# Patient Record
Sex: Female | Born: 1940 | Race: Black or African American | Hispanic: No | Marital: Single | Smoking: Never smoker
Health system: Southern US, Community
[De-identification: ages and names within clinical notes are randomized; demographics above are authoritative.]

## PROBLEM LIST (undated history)

## (undated) DIAGNOSIS — I509 Heart failure, unspecified: Secondary | ICD-10-CM

## (undated) DIAGNOSIS — G4700 Insomnia, unspecified: Secondary | ICD-10-CM

## (undated) DIAGNOSIS — J9690 Respiratory failure, unspecified, unspecified whether with hypoxia or hypercapnia: Secondary | ICD-10-CM

## (undated) DIAGNOSIS — R4701 Aphasia: Secondary | ICD-10-CM

## (undated) DIAGNOSIS — E11319 Type 2 diabetes mellitus with unspecified diabetic retinopathy without macular edema: Secondary | ICD-10-CM

## (undated) DIAGNOSIS — I10 Essential (primary) hypertension: Secondary | ICD-10-CM

## (undated) DIAGNOSIS — F419 Anxiety disorder, unspecified: Secondary | ICD-10-CM

## (undated) DIAGNOSIS — B009 Herpesviral infection, unspecified: Secondary | ICD-10-CM

## (undated) DIAGNOSIS — E119 Type 2 diabetes mellitus without complications: Secondary | ICD-10-CM

## (undated) DIAGNOSIS — N289 Disorder of kidney and ureter, unspecified: Secondary | ICD-10-CM

## (undated) DIAGNOSIS — F329 Major depressive disorder, single episode, unspecified: Secondary | ICD-10-CM

## (undated) DIAGNOSIS — E785 Hyperlipidemia, unspecified: Secondary | ICD-10-CM

## (undated) DIAGNOSIS — J189 Pneumonia, unspecified organism: Secondary | ICD-10-CM

## (undated) DIAGNOSIS — M1990 Unspecified osteoarthritis, unspecified site: Secondary | ICD-10-CM

## (undated) HISTORY — DX: Aphasia: R47.01

## (undated) HISTORY — DX: Herpesviral infection, unspecified: B00.9

## (undated) HISTORY — DX: Anxiety disorder, unspecified: F41.9

## (undated) HISTORY — DX: Insomnia, unspecified: G47.00

## (undated) HISTORY — PX: EYE SURGERY: SHX253

## (undated) HISTORY — DX: Disorder of kidney and ureter, unspecified: N28.9

## (undated) HISTORY — DX: Major depressive disorder, single episode, unspecified: F32.9

## (undated) HISTORY — PX: CATARACT EXTRACTION: SUR2

## (undated) HISTORY — DX: Type 2 diabetes mellitus with unspecified diabetic retinopathy without macular edema: E11.319

## (undated) HISTORY — DX: Hyperlipidemia, unspecified: E78.5

## (undated) HISTORY — PX: BELOW KNEE LEG AMPUTATION: SUR23

## (undated) HISTORY — DX: Essential (primary) hypertension: I10

## (undated) HISTORY — DX: Pneumonia, unspecified organism: J18.9

## (undated) HISTORY — DX: Respiratory failure, unspecified, unspecified whether with hypoxia or hypercapnia: J96.90

## (undated) HISTORY — DX: Heart failure, unspecified: I50.9

## (undated) HISTORY — DX: Type 2 diabetes mellitus without complications: E11.9

## (undated) HISTORY — DX: Unspecified osteoarthritis, unspecified site: M19.90

---

## 2016-03-04 ENCOUNTER — Inpatient Hospital Stay (HOSPITAL_COMMUNITY)
Admission: AD | Admit: 2016-03-04 | Payer: Self-pay | Source: Other Acute Inpatient Hospital | Admitting: Family Medicine

## 2016-03-04 ENCOUNTER — Inpatient Hospital Stay: Admit: 2016-03-04 | Payer: Self-pay | Source: Other Acute Inpatient Hospital | Admitting: Family Medicine

## 2016-03-06 ENCOUNTER — Inpatient Hospital Stay: Admit: 2016-03-06 | Payer: Self-pay | Admitting: Internal Medicine

## 2016-06-30 ENCOUNTER — Ambulatory Visit (INDEPENDENT_AMBULATORY_CARE_PROVIDER_SITE_OTHER): Payer: Medicare Other | Admitting: Family

## 2016-06-30 ENCOUNTER — Encounter (INDEPENDENT_AMBULATORY_CARE_PROVIDER_SITE_OTHER): Payer: Self-pay

## 2016-06-30 DIAGNOSIS — IMO0002 Reserved for concepts with insufficient information to code with codable children: Secondary | ICD-10-CM | POA: Insufficient documentation

## 2016-06-30 DIAGNOSIS — Z89511 Acquired absence of right leg below knee: Secondary | ICD-10-CM

## 2016-06-30 NOTE — Progress Notes (Signed)
   Office Visit Note   Patient: Julia Thomas           Date of Birth: 01/30/1941           MRN: [id number] Visit Date: 06/30/2016              Requested by: No referring provider defined for this encounter. PCP: No primary care provider on file.   Assessment & Plan: Visit Diagnoses:  1. Below knee amputation status, right (HCC)     Plan: have provided an order for prosthesis set up on the right with [employer]. She will be a good K2 ambulator. Will need physical therapy and gait training. She will follow up with us in 2 months.   Follow-Up Instructions: Return in about 2 months (around 08/31/2016).   Orders:  No orders of the defined types were placed in this encounter.  No orders of the defined types were placed in this encounter.     Procedures: No procedures performed   Clinical Data: No additional findings.   Subjective: Chief Complaint  Patient presents with  . Right Leg - New Evaluation    Prosthetic limb eval right below the knee amputation.    Pt is a 75 year old woman seen for initial evaluation of right below knee amputation for prosthetic set up. She is a resident at [facility]. She is a right BKA, done on 01/2016 with another physician. Residual limb is well healed and has not had a shrinker for any type of post amputation compression.     Review of Systems  Constitutional: Negative for chills and fever.     Objective: Vital Signs: There were no vitals taken for this visit.  Physical Exam  Constitutional: She is oriented to person, place, and time. She appears well-developed and well-nourished.  Pulmonary/Chest: Effort normal.  Musculoskeletal:  Right BKA, residual limb is well healed. No edema, not fully consolidated. No open areas. No callus. No erythema.  Neurological: She is alert and oriented to person, place, and time.  Skin: Skin is warm and dry.  Psychiatric: She has a normal mood and affect.  Nursing note reviewed.   Ortho  Exam  Specialty Comments:  No specialty comments available.  Imaging: No results found.   PMFS History: Patient Active Problem List   Diagnosis Date Noted  . Below knee amputation status, right (HCC) 06/30/2016   No past medical history on file.  No family history on file.  No past surgical history on file. Social History   Occupational History  . Not on file.   Social History Main Topics  . Smoking status: Not on file  . Smokeless tobacco: Not on file  . Alcohol use Not on file  . Drug use: Unknown  . Sexual activity: Not on file

## 2016-07-07 ENCOUNTER — Ambulatory Visit (INDEPENDENT_AMBULATORY_CARE_PROVIDER_SITE_OTHER): Payer: Self-pay | Admitting: Orthopedic Surgery

## 2016-09-01 ENCOUNTER — Ambulatory Visit (INDEPENDENT_AMBULATORY_CARE_PROVIDER_SITE_OTHER): Payer: Medicaid Other | Admitting: Orthopedic Surgery

## 2016-11-15 ENCOUNTER — Telehealth (INDEPENDENT_AMBULATORY_CARE_PROVIDER_SITE_OTHER): Payer: Self-pay | Admitting: Orthopedic Surgery

## 2016-11-15 NOTE — Telephone Encounter (Signed)
I called and advised that they should bring the prosthetic to the appt so that Dr. Lajoyce Corners can check. Also the pt's daughter is concerned about her physical therapy being completed and if there was a way to have them continue to work with her on strengthening . Pt has appt and advised that I will document and this can be discussed at the visit.

## 2016-11-15 NOTE — Telephone Encounter (Signed)
Patients daughter called expressing her concern for her mothers amputated leg. The patient does have a prosthetic leg but she can not walk using it. Wanted to know if she should bring it to her appointment? [phone] # [phone]

## 2016-11-19 ENCOUNTER — Ambulatory Visit (INDEPENDENT_AMBULATORY_CARE_PROVIDER_SITE_OTHER): Payer: Medicare Other | Admitting: Orthopedic Surgery

## 2016-11-25 ENCOUNTER — Ambulatory Visit (INDEPENDENT_AMBULATORY_CARE_PROVIDER_SITE_OTHER): Payer: Medicare Other | Admitting: Orthopedic Surgery

## 2016-11-25 DIAGNOSIS — IMO0002 Reserved for concepts with insufficient information to code with codable children: Secondary | ICD-10-CM

## 2016-11-25 DIAGNOSIS — Z89511 Acquired absence of right leg below knee: Secondary | ICD-10-CM | POA: Diagnosis not present

## 2016-11-25 DIAGNOSIS — R2681 Unsteadiness on feet: Secondary | ICD-10-CM

## 2016-11-25 NOTE — Progress Notes (Signed)
   Office Visit Note   Patient: Julia SalisburyDorothy Thomas           Date of Birth: 03/12/1941           MRN: [id number] Visit Date: 11/25/2016              Requested by: No referring provider defined for this encounter. PCP: Patient, No Pcp Per  Chief Complaint  Patient presents with  . Right Leg - Follow-up    Right BKA      HPI: Patient is a 76 year old woman seen in follow up for right below the knee amputation. Has been residing at [facility]. Family that accompanies states they have paused gait training as patient does not have the balance required to proceed. Has been still working with PT once weekly for balance and strengthening of lower extremities. No concerns voiced.   Assessment & Plan: Visit Diagnoses:  1. Below knee amputation status, right (HCC)   2. Gait instability     Plan: Continue with PT. Follow up in office as needed.   Follow-Up Instructions: Return if symptoms worsen or fail to improve.   Ortho Exam  Patient is alert, oriented, no adenopathy, well-dressed, normal affect, normal respiratory effort. Right BKA is well healed and consolidated. No open areas, impending break down. No callus formation.   Imaging: No results found.  Labs: No results found for: HGBA1C, ESRSEDRATE, CRP, LABURIC, REPTSTATUS, GRAMSTAIN, CULT, LABORGA  Orders:  No orders of the defined types were placed in this encounter.  No orders of the defined types were placed in this encounter.    Procedures: No procedures performed  Clinical Data: No additional findings.  ROS:  All other systems negative, except as noted in the HPI. Review of Systems  Constitutional: Negative for chills and fever.    Objective: Vital Signs: There were no vitals taken for this visit.  Specialty Comments:  No specialty comments available.  PMFS History: Patient Active Problem List   Diagnosis Date Noted  . Below knee amputation status, right (HCC) 06/30/2016   No past medical  history on file.  No family history on file.  No past surgical history on file. Social History   Occupational History  . Not on file.   Social History Main Topics  . Smoking status: Not on file  . Smokeless tobacco: Not on file  . Alcohol use Not on file  . Drug use: Unknown  . Sexual activity: Not on file

## 2018-02-13 ENCOUNTER — Encounter (INDEPENDENT_AMBULATORY_CARE_PROVIDER_SITE_OTHER): Payer: Medicare Other | Admitting: Ophthalmology

## 2018-02-16 NOTE — Progress Notes (Signed)
[facility] Note  02/17/2018     CHIEF COMPLAINT Patient presents for Retina Evaluation and Diabetic Eye Exam   HISTORY OF PRESENT ILLNESS: Julia Thomas is a 77 y.o. female who presents to the clinic today for:   HPI    Retina Evaluation    In both eyes.  This started 1 week ago.  Associated Symptoms Negative for Flashes, Blind Spot, Photophobia, Scalp Tenderness, Fever, Floaters, Pain, Glare, Jaw Claudication, Weight Loss, Distortion, Redness, Trauma, Shoulder/Hip pain and Fatigue.  Context:  distance vision and near vision.  Treatments tried include surgery.  Response to treatment was significant improvement.  I, the attending physician,  performed the HPI with the patient and updated documentation appropriately.          Diabetic Eye Exam    Vision is stable.  Associated Symptoms Negative for Distortion, Redness, Trauma, Shoulder/Hip pain, Fatigue, Weight Loss, Jaw Claudication, Glare, Pain, Floaters, Flashes, Blind Spot, Photophobia, Scalp Tenderness and Fever.  Diabetes characteristics include Type 2 and on insulin.  This started 7 years ago.  Blood sugar level is controlled.  Last Blood Glucose 119.  Associated Diagnosis Kidney Disease.  I, the attending physician,  performed the HPI with the patient and updated documentation appropriately.          Comments    Referral of Dr. Manuella Ghazi retina evaluation. Patient is accompanied  By daughter Julia Thomas. Patient states denies visual issues. Pt is DM2 x 7 years, Bs 119 (02/16/18),A1c unknown,Pt is on insulin , bs are usually WINL per patient, Pt had cataract sx OD 11/2017 and in [place] appx 20yr ago OS with significant improvement. Pt reports she had BKA OD 2017.Pt is using Pred Forte OU.        Last edited by ZBernarda Caffey MD on 02/17/2018  3:10 PM. (History)    Pt states Dr. GSusa Simmondsreferred her to Dr. SManuella Ghazi Pt states she was told that she needed injections OU; Pt states she does not want injections,  reports she has had injections in the past and "they did not help me"; Pt states she has had laser in the past in [place]; Pt reports she has a follow up with Dr. SManuella Ghaziin September;   Referring physician: SFeliz Beam [place] [id number] HISTORICAL INFORMATION:   Selected notes from the MEDICAL RECORD NUMBER Referred by Dr. RDwana Melenafor FA LEE: 06.21.19 (Matilde Bash [BCVA: OD: 20/50+2 OS: 20/60+2] Ocular Hx-NPDR with DME OU, pseudo OU, mac scar OS, glaucoma suspect, lamellar mac hole OS PMH-DM (taking Lantus), HTN    CURRENT MEDICATIONS: Current Outpatient Medications (Ophthalmic Drugs)  Medication Sig  . ketorolac (ACULAR) 0.5 % ophthalmic solution   . prednisoLONE acetate (PRED FORTE) 1 % ophthalmic suspension    No current facility-administered medications for this visit.  (Ophthalmic Drugs)   Current Outpatient Medications (Other)  Medication Sig  . acetaminophen (TYLENOL) 325 MG tablet 650 mg 3 times daily.  .[name] Kitchenaspirin EC 81 MG tablet 81 mg every morning.  .[name] Kitchenatorvastatin (LIPITOR) 10 MG tablet 10 mg nightly.  . fluticasone (FLONASE) 50 MCG/ACT nasal spray 1 spray every morning.  . Insulin Glargine (LANTUS SOLOSTAR) 100 UNIT/ML Solostar Pen 10 Units every morning.  . iron polysaccharides (NIFEREX) 150 MG capsule 150 mg every morning.  . metoprolol tartrate (LOPRESSOR) 25 MG tablet 25 mg 2 times daily.  . polyethylene glycol (MIRALAX / GLYCOLAX) packet 17  g.  . Vitamin D, Ergocalciferol, (DRISDOL) 50000 units CAPS capsule 50,000 Units every 30 days.   No current facility-administered medications for this visit.  (Other)      REVIEW OF SYSTEMS: ROS    Positive for: Endocrine   Negative for: Constitutional, Gastrointestinal, Neurological, Skin, Genitourinary, Musculoskeletal, HENT, Cardiovascular, Eyes, Respiratory, Psychiatric, Allergic/Imm, Heme/Lymph   Last edited by Zenovia Jordan, LPN on 08/27/9369  6:96 PM. (History)        ALLERGIES No Known Allergies  PAST MEDICAL HISTORY Past Medical History:  Diagnosis Date  . Anxiety   . Aphasia   . CHF (congestive heart failure) ([place])   . Diabetes ([place])   . Herpes virus disease   . Hyperlipemia   . Hypertension   . Insomnia   . Kidney disease   . Major depression, chronic   . Osteoarthritis   . Pneumonia   . Respiratory failure [facility])    Past Surgical History:  Procedure Laterality Date  . BELOW KNEE LEG AMPUTATION    . CATARACT EXTRACTION    . EYE SURGERY      FAMILY HISTORY History reviewed. No pertinent family history.  SOCIAL HISTORY Social History   Tobacco Use  . Smoking status: Never Smoker  . Smokeless tobacco: Never Used  Substance Use Topics  . Alcohol use: Never    Frequency: Never  . Drug use: Never         OPHTHALMIC EXAM:  Base Eye Exam    Visual Acuity (Snellen - Linear)      Right Left   Dist [place] 20/40 -2 20/70   Dist ph [place] NI NI       Tonometry (Tonopen, 2:04 PM)      Right Left   Pressure 15 15       Pupils      Dark Light Shape React APD   Right 3 2 Round Brisk None   Left 3 2 Round Brisk None       Visual Fields (Counting fingers)      Left Right    Full Full       Extraocular Movement      Right Left    Full, Ortho Full, Ortho       Neuro/Psych    Oriented x3:  Yes   Mood/Affect:  Normal       Dilation    Both eyes:  1.0% Mydriacyl, 2.5% Phenylephrine @ 2:04 PM        Slit Lamp and Fundus Exam    Slit Lamp Exam      Right Left   Lids/Lashes Dermatochalasis - upper lid Dermatochalasis - upper lid   Conjunctiva/Sclera White and quiet Mild Melanosis   Cornea Arcus, Temporal Well healed cataract wounds Arcus, 1+ Punctate epithelial erosions   Anterior Chamber Deep and quiet Deep and quiet   Iris Round and dilated Round and dilated   Lens PC IOL in good position PC IOL in good position   Vitreous Vitreous syneresis, Posterior vitreous detachment Vitreous syneresis, Posterior  vitreous detachment       Fundus Exam      Right Left   Disc peripapillary pigmentation, no NVD sharp, mild pallor   C/D Ratio 0.6 0.6   Macula Blunted foveal reflex, Microaneurysms, scattered cystic changes Blunted foveal reflex, Epiretinal membrane, pigmented laser scars, scattered Microaneurysms   Vessels Vascular attenuation, Tortuous Severe Vascular attenuation, peripherally scerotic    Periphery Attached, scattered IRH Attached; scattered [facility]  Refraction    Manifest Refraction      Sphere Cylinder Axis Dist [place]   Right [place] Sphere  20/40   Left +0.25 +0.75 103 20/70-          IMAGING AND PROCEDURES  Imaging and Procedures for _0 @  OCT, Retina - OU - Both Eyes       Right Eye Quality was good. Central Foveal Thickness: 376. Progression has no prior data. Findings include abnormal foveal contour, no SRF, intraretinal fluid.   Left Eye Quality was good. Central Foveal Thickness: (Unable to obtain). Progression has no prior data. Findings include intraretinal fluid, abnormal foveal contour, no SRF, outer retinal atrophy (Scattered ORA).   Notes *Images captured and stored on drive  Diagnosis / Impression:  +DME OU  Clinical management:  See below  Abbreviations: NFP - Normal foveal profile. CME - cystoid macular edema. PED - pigment epithelial detachment. IRF - intraretinal fluid. SRF - subretinal fluid. EZ - ellipsoid zone. ERM - epiretinal membrane. ORA - outer retinal atrophy. ORT - outer retinal tubulation. SRHM - subretinal hyper-reflective material         Fluorescein Angiography [place] (Transit OD)       Right Eye Progression has no prior data. Early phase findings include microaneurysm, vascular perfusion defect (IRMA). Mid/Late phase findings include microaneurysm, vascular perfusion defect, leakage (IRMA).   Left Eye Progression has no prior data. Early phase findings include microaneurysm, vascular perfusion defect. Mid/Late phase  findings include leakage, microaneurysm, vascular perfusion defect.   Notes *Images captured and stored on [employer], not [place]  Impression: Severe NPDR OU                  ASSESSMENT/PLAN:    ICD-10-CM   1. 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 Retinal edema H35.81 OCT, Retina - OU - Both Eyes  3. Essential hypertension I10   4. Hypertensive retinopathy of both eyes H35.033 Fluorescein Angiography [place] (Transit OD)  5. Pseudophakia of both eyes Z96.1     1,2. Severe Non-proliferative diabetic retinopathy w/ DME, OU - The incidence, risk factors for progression, natural history and treatment options for diabetic retinopathy  were discussed with patient.   - The need for close monitoring of blood glucose, blood pressure, and serum lipids, avoiding cigarette or any type of tobacco, and the need for long term follow up was also discussed with patient. - exam shows scattered - FA shows scattered MA and capillary nonperfusion OU -- no NV - OCT shows diabetic macular edema OU  The natural history, pathology, and characteristics of diabetic macular edema discussed with patient.  A generalized discussion of the major clinical trials concerning treatment of diabetic macular edema (ETDRS, DCT, SCORE, RISE / RIDE, and ongoing DRCR net studies) was completed.  This discussion included mention of the various approaches to treating diabetic macular edema (observation, laser photocoagulation, anti-VEGF injections with lucentis / Avastin / Eylea, steroid injections with Kenalog / Ozurdex, and intraocular surgery with vitrectomy).  The goal hemoglobin A1C of 6-7 was discussed, as well as importance of smoking cessation and hypertension control.  Need for ongoing treatment and monitoring were specifically discussed with reference to chronic nature  of diabetic macular edema. - DME being managed by Dr. Manuella Ghazi -- possible Iluvien due to pt's hesitance with possibility of repeat injections - recommend PRP laser OU for peripheral capillary nonperfusion, OS first -  pt wishes to come back for treatment - F/U within next 2 weeks for PRP OS  3,4. Hypertensive retinopathy OU - discussed importance of tight BP control - monitor  5. Pseudophakia OU  - s/p CE/IOL by expert surgeon Dr. Susa Simmonds   - beautiful surgeries  - monitor   Ophthalmic Meds Ordered this visit:  No orders of the defined types were placed in this encounter.      Return in about 2 weeks (around 03/03/2018) for F/U NPDR OU, DFE, PRP OS.  There are no Patient Instructions on file for this visit.   Explained the diagnoses, plan, and follow up with the patient and they expressed understanding.  Patient expressed understanding of the importance of proper follow up care.   This document serves as a record of services personally performed by Gardiner Sleeper, MD, PhD. It was created on their behalf by Ernest Mallick, OA, an ophthalmic assistant. The creation of this record is the provider's dictation and/or activities during the visit.    Electronically signed by: Ernest Mallick, OA  08.01.2019 11:56 PM   This document serves as a record of services personally performed by Gardiner Sleeper, MD, PhD. It was created on their behalf by Catha Brow, [place], a certified ophthalmic assistant. The creation of this record is the provider's dictation and/or activities during the visit.  Electronically signed by: Catha Brow, COA  08.02.19 11:56 PM     Gardiner Sleeper, M.D., Ph.D. Diseases & Surgery of the Retina and Vitreous [facility]   I have reviewed the above documentation for accuracy and completeness, and I agree with the above. Gardiner Sleeper, M.D., Ph.D. 02/19/18 11:56 PM     Abbreviations: M myopia (nearsighted); A astigmatism; H  hyperopia (farsighted); P presbyopia; Mrx spectacle prescription;  CTL contact lenses; OD right eye; OS left eye; OU both eyes  XT exotropia; ET esotropia; PEK punctate epithelial keratitis; PEE punctate epithelial erosions; DES dry eye syndrome; MGD meibomian gland dysfunction; ATs artificial tears; PFAT's preservative free artificial tears; Alpha nuclear sclerotic cataract; PSC posterior subcapsular cataract; ERM epi-retinal membrane; PVD posterior vitreous detachment; RD retinal detachment; DM diabetes mellitus; DR diabetic retinopathy; NPDR non-proliferative diabetic retinopathy; PDR proliferative diabetic retinopathy; CSME clinically significant macular edema; DME diabetic macular edema; dbh dot blot hemorrhages; CWS cotton wool spot; POAG primary open angle glaucoma; C/D cup-to-disc ratio; HVF humphrey visual field; GVF goldmann visual field; OCT optical coherence tomography; IOP intraocular pressure; BRVO Branch retinal vein occlusion; CRVO central retinal vein occlusion; CRAO central retinal artery occlusion; BRAO branch retinal artery occlusion; RT retinal tear; SB scleral buckle; PPV pars plana vitrectomy; VH Vitreous hemorrhage; PRP panretinal laser photocoagulation; IVK intravitreal kenalog; VMT vitreomacular traction; MH Macular hole;  NVD neovascularization of the disc; NVE neovascularization elsewhere; AREDS age related eye disease study; ARMD age related macular degeneration; POAG primary open angle glaucoma; EBMD epithelial/anterior basement membrane dystrophy; ACIOL anterior chamber intraocular lens; IOL intraocular lens; PCIOL posterior chamber intraocular lens; Phaco/IOL phacoemulsification with intraocular lens placement; Davis photorefractive keratectomy; LASIK laser assisted in situ keratomileusis; HTN hypertension; DM diabetes mellitus; COPD chronic obstructive pulmonary disease

## 2018-02-17 ENCOUNTER — Encounter (INDEPENDENT_AMBULATORY_CARE_PROVIDER_SITE_OTHER): Payer: Self-pay | Admitting: Ophthalmology

## 2018-02-17 ENCOUNTER — Ambulatory Visit (INDEPENDENT_AMBULATORY_CARE_PROVIDER_SITE_OTHER): Payer: Medicare Other | Admitting: Ophthalmology

## 2018-02-17 DIAGNOSIS — H35033 Hypertensive retinopathy, bilateral: Secondary | ICD-10-CM | POA: Diagnosis not present

## 2018-02-17 DIAGNOSIS — Z961 Presence of intraocular lens: Secondary | ICD-10-CM

## 2018-02-17 DIAGNOSIS — I10 Essential (primary) hypertension: Secondary | ICD-10-CM | POA: Diagnosis not present

## 2018-02-17 DIAGNOSIS — H3581 Retinal edema: Secondary | ICD-10-CM

## 2018-02-17 DIAGNOSIS — E113413 Type 2 diabetes mellitus with severe nonproliferative diabetic retinopathy with macular edema, bilateral: Secondary | ICD-10-CM

## 2018-02-19 ENCOUNTER — Encounter (INDEPENDENT_AMBULATORY_CARE_PROVIDER_SITE_OTHER): Payer: Self-pay | Admitting: Ophthalmology

## 2018-03-06 NOTE — Progress Notes (Signed)
[facility] Note  03/07/2018     CHIEF COMPLAINT Patient presents for Retina Follow Up   HISTORY OF PRESENT ILLNESS: Julia Thomas is a 77 y.o. female who presents to the clinic today for:   HPI    Retina Follow Up    Patient presents with  Diabetic Retinopathy.  In both eyes.  Severity is severe.  Duration of 2.5 weeks.  I, the attending physician,  performed the HPI with the patient and updated documentation appropriately.          Comments    Patient states vision the same OU. Last BS was 120 yesterday.       Last edited by Bernarda Caffey, MD on 03/07/2018  4:01 PM. (History)    Pt here for PRP OS  Referring physician: No referring provider defined for this encounter.  HISTORICAL INFORMATION:   Selected notes from the MEDICAL RECORD NUMBER Referred by Dr. Dwana Melena for FA LEE: 06.21.19 Matilde Bash) [BCVA: OD: 20/50+2 OS: 20/60+2] Ocular Hx-NPDR with DME OU, pseudo OU, mac scar OS, glaucoma suspect, lamellar mac hole OS PMH-DM (taking Lantus), HTN    CURRENT MEDICATIONS: Current Outpatient Medications (Ophthalmic Drugs)  Medication Sig  . ketorolac (ACULAR) 0.5 % ophthalmic solution   . prednisoLONE acetate (PRED FORTE) 1 % ophthalmic suspension    No current facility-administered medications for this visit.  (Ophthalmic Drugs)   Current Outpatient Medications (Other)  Medication Sig  . acetaminophen (TYLENOL) 325 MG tablet 650 mg 3 times daily.  [name] Kitchen aspirin EC 81 MG tablet 81 mg every morning.  [name] Kitchen atorvastatin (LIPITOR) 10 MG tablet 10 mg nightly.  . fluticasone (FLONASE) 50 MCG/ACT nasal spray 1 spray every morning.  . Insulin Glargine (LANTUS SOLOSTAR) 100 UNIT/ML Solostar Pen 10 Units every morning.  . iron polysaccharides (NIFEREX) 150 MG capsule 150 mg every morning.  . metoprolol tartrate (LOPRESSOR) 25 MG tablet 25 mg 2 times daily.  . mupirocin cream (BACTROBAN) 2 % Apply 2 application topically 2 (two) times daily.  .  polyethylene glycol (MIRALAX / GLYCOLAX) packet 17 g.  . Vitamin D, Ergocalciferol, (DRISDOL) 50000 units CAPS capsule 50,000 Units every 30 days.   No current facility-administered medications for this visit.  (Other)      REVIEW OF SYSTEMS: ROS    Positive for: Endocrine   Negative for: Constitutional, Gastrointestinal, Neurological, Skin, Genitourinary, Musculoskeletal, HENT, Cardiovascular, Eyes, Respiratory, Psychiatric, Allergic/Imm, Heme/Lymph   Last edited by Roselee Nova D on 03/07/2018  3:04 PM. (History)       ALLERGIES No Known Allergies  PAST MEDICAL HISTORY Past Medical History:  Diagnosis Date  . Anxiety   . Aphasia   . CHF (congestive heart failure) ([place])   . Diabetes ([place])   . Herpes virus disease   . Hyperlipemia   . Hypertension   . Insomnia   . Kidney disease   . Major depression, chronic   . Osteoarthritis   . Pneumonia   . Respiratory failure [facility])    Past Surgical History:  Procedure Laterality Date  . BELOW KNEE LEG AMPUTATION    . CATARACT EXTRACTION    . EYE SURGERY      FAMILY HISTORY History reviewed. No pertinent family history.  SOCIAL HISTORY Social History   Tobacco Use  . Smoking status: Never Smoker  . Smokeless tobacco: Never Used  Substance Use Topics  . Alcohol use: Never    Frequency: Never  . Drug use: Never  OPHTHALMIC EXAM:  Base Eye Exam    Visual Acuity (Snellen - Linear)      Right Left   Dist [place] 20/40 -2 20/70   Dist ph [place] NI 20/60       Tonometry (Tonopen, 3:12 PM)      Right Left   Pressure 14 17       Pupils      Dark Light Shape React APD   Right 3 2 Round Brisk None   Left 3 2 Round Brisk None       Visual Fields (Counting fingers)      Left Right    Full Full       Extraocular Movement      Right Left    Full, Ortho Full, Ortho       Neuro/Psych    Oriented x3:  Yes   Mood/Affect:  Normal       Dilation    Both eyes:  1.0% Mydriacyl, 10% phenylepherine @ 3:12 PM        Dilation #2    Both eyes:  1.0% Mydriacyl, 2.5% Phenylephrine @ 3:29 PM       Dilation Comments   Dilation X2 with both drops        Slit Lamp and Fundus Exam    Slit Lamp Exam      Right Left   Lids/Lashes Dermatochalasis - upper lid Dermatochalasis - upper lid   Conjunctiva/Sclera White and quiet Mild Melanosis   Cornea Arcus, Temporal Well healed cataract wounds Arcus, 1+ Punctate epithelial erosions   Anterior Chamber Deep and quiet Deep and quiet   Iris Round and dilated Round and dilated   Lens PC IOL in good position PC IOL in good position   Vitreous Vitreous syneresis, Posterior vitreous detachment Vitreous syneresis, Posterior vitreous detachment       Fundus Exam      Right Left   Disc peripapillary pigmentation, no NVD sharp, mild pallor   C/D Ratio 0.6 0.6   Macula Blunted foveal reflex, Microaneurysms, scattered cystic changes Blunted foveal reflex, Epiretinal membrane, pigmented laser scars, scattered Microaneurysms   Vessels Vascular attenuation, Tortuous Severe Vascular attenuation, peripherally scerotic    Periphery Attached, scattered IRH Attached; scattered IRH          IMAGING AND PROCEDURES  Imaging and Procedures for @TODAY @  Panretinal Photocoagulation - OS - Left Eye       LASER PROCEDURE NOTE  Diagnosis:   Severe Non-Proliferative Diabetic Retinopathy, LEFT EYE  Procedure:  Pan-retinal photocoagulation using laser indirect ophthalmoscope, LEFT EYE  Anesthesia:  Topical  Surgeon: Bernarda Caffey, MD, PhD   Informed consent obtained, operative eye marked, and time out performed prior to initiation of laser.   Lumenis [id number] Laser Indirect Ophthalmoscope Power: 280 mW Duration: 70 msec   # spots: 774 spots mostly nasal hemisphere  Complications: None.  Notes: pt unable to complete PRP due to pain  RTC: 2-3 wks for PRP completion OS -- will use subconj lido  Patient tolerated the procedure well and received written and  verbal post-procedure care information/education.                 ASSESSMENT/PLAN:    ICD-10-CM   1. 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 Retinal edema H35.81 CANCELED: OCT, Retina -  OU - Both Eyes  3. Essential hypertension I10   4. Hypertensive retinopathy of both eyes H35.033   5. Pseudophakia of both eyes Z96.1     1,2. Severe Non-proliferative diabetic retinopathy w/ DME, OU - The incidence, risk factors for progression, natural history and treatment options for diabetic retinopathy  were discussed with patient.   - The need for close monitoring of blood glucose, blood pressure, and serum lipids, avoiding cigarette or any type of tobacco, and the need for long term follow up was also discussed with patient. - exam shows scattered - FA shows scattered MA and capillary nonperfusion OU -- no NV - OCT shows diabetic macular edema OU  The natural history, pathology, and characteristics of diabetic macular edema discussed with patient.  A generalized discussion of the major clinical trials concerning treatment of diabetic macular edema (ETDRS, DCT, SCORE, RISE / RIDE, and ongoing DRCR net studies) was completed.  This discussion included mention of the various approaches to treating diabetic macular edema (observation, laser photocoagulation, anti-VEGF injections with lucentis / Avastin / Eylea, steroid injections with Kenalog / Ozurdex, and intraocular surgery with vitrectomy).  The goal hemoglobin A1C of 6-7 was discussed, as well as importance of smoking cessation and hypertension control.  Need for ongoing treatment and monitoring were specifically discussed with reference to chronic nature of diabetic macular edema. - DME being managed by Dr. Manuella Ghazi -- possible Iluvien due to pt's hesitance with  possibility of repeat injections - recommend PRP laser OU for peripheral capillary nonperfusion, OS first - RBA of procedure discussed, questions answered - informed consent obtained and signed - see procedure note -- laser stopped due to patient discomfort - RTC 2 wks for completion of PRP OS -- will use subconj lidocaine  3,4. Hypertensive retinopathy OU - discussed importance of tight BP control - monitor  5. Pseudophakia OU  - s/p CE/IOL by expert surgeon Dr. Susa Simmonds   - beautiful surgeries  - monitor   Ophthalmic Meds Ordered this visit:  No orders of the defined types were placed in this encounter.      Return in about 2 weeks (around 03/21/2018) for Laser OS, OCT.  There are no Patient Instructions on file for this visit.   Explained the diagnoses, plan, and follow up with the patient and they expressed understanding.  Patient expressed understanding of the importance of proper follow up care.   This document serves as a record of services personally performed by Gardiner Sleeper, MD, PhD. It was created on their behalf by Ernest Mallick, OA, an ophthalmic assistant. The creation of this record is the provider's dictation and/or activities during the visit.    Electronically signed by: Ernest Mallick, OA  08.20.2019 4:20 PM    Gardiner Sleeper, M.D., Ph.D. Diseases & Surgery of the Retina and Vitreous [facility]   I have reviewed the above documentation for accuracy and completeness, and I agree with the above. Gardiner Sleeper, M.D., Ph.D. 03/07/18 4:20 PM    Abbreviations: M myopia (nearsighted); A astigmatism; H hyperopia (farsighted); P presbyopia; Mrx spectacle prescription;  CTL contact lenses; OD right eye; OS left eye; OU both eyes  XT exotropia; ET esotropia; PEK punctate epithelial keratitis; PEE punctate epithelial erosions; DES dry eye syndrome; MGD meibomian gland dysfunction; ATs artificial tears; PFAT's preservative free artificial  tears; [place] nuclear sclerotic cataract; PSC posterior subcapsular cataract; ERM epi-retinal membrane; PVD posterior vitreous detachment; RD retinal detachment; DM diabetes mellitus; DR diabetic  retinopathy; NPDR non-proliferative diabetic retinopathy; PDR proliferative diabetic retinopathy; CSME clinically significant macular edema; DME diabetic macular edema; dbh dot blot hemorrhages; CWS cotton wool spot; POAG primary open angle glaucoma; C/D cup-to-disc ratio; HVF humphrey visual field; GVF goldmann visual field; OCT optical coherence tomography; IOP intraocular pressure; BRVO Branch retinal vein occlusion; CRVO central retinal vein occlusion; CRAO central retinal artery occlusion; BRAO branch retinal artery occlusion; RT retinal tear; SB scleral buckle; PPV pars plana vitrectomy; VH Vitreous hemorrhage; PRP panretinal laser photocoagulation; IVK intravitreal kenalog; VMT vitreomacular traction; MH Macular hole;  NVD neovascularization of the disc; NVE neovascularization elsewhere; AREDS age related eye disease study; ARMD age related macular degeneration; POAG primary open angle glaucoma; EBMD epithelial/anterior basement membrane dystrophy; ACIOL anterior chamber intraocular lens; IOL intraocular lens; PCIOL posterior chamber intraocular lens; Phaco/IOL phacoemulsification with intraocular lens placement; [place] photorefractive keratectomy; LASIK laser assisted in situ keratomileusis; HTN hypertension; DM diabetes mellitus; COPD chronic obstructive pulmonary disease

## 2018-03-07 ENCOUNTER — Encounter (INDEPENDENT_AMBULATORY_CARE_PROVIDER_SITE_OTHER): Payer: Self-pay | Admitting: Ophthalmology

## 2018-03-07 ENCOUNTER — Ambulatory Visit (INDEPENDENT_AMBULATORY_CARE_PROVIDER_SITE_OTHER): Payer: 59 | Admitting: Ophthalmology

## 2018-03-07 DIAGNOSIS — H35033 Hypertensive retinopathy, bilateral: Secondary | ICD-10-CM

## 2018-03-07 DIAGNOSIS — E113413 Type 2 diabetes mellitus with severe nonproliferative diabetic retinopathy with macular edema, bilateral: Secondary | ICD-10-CM

## 2018-03-07 DIAGNOSIS — Z961 Presence of intraocular lens: Secondary | ICD-10-CM

## 2018-03-07 DIAGNOSIS — H3581 Retinal edema: Secondary | ICD-10-CM

## 2018-03-07 DIAGNOSIS — I10 Essential (primary) hypertension: Secondary | ICD-10-CM

## 2018-03-17 NOTE — Progress Notes (Signed)
[facility] Note  03/21/2018     CHIEF COMPLAINT Patient presents for Retina Follow Up   HISTORY OF PRESENT ILLNESS: Julia Thomas is a 77 y.o. female who presents to the clinic today for:   HPI    Retina Follow Up    Patient presents with  Diabetic Retinopathy.  In both eyes.  Severity is severe.  Duration of 2 weeks.  I, the attending physician,  performed the HPI with the patient and updated documentation appropriately.          Comments    Patient states vision the same as last visit ou. BS was 119 this am. Last A1c unknown. S/P PRP OS.         Last edited by Bernarda Caffey, MD on 03/21/2018  3:24 PM. (History)    Pt here for PRP OS  Referring physician: No referring provider defined for this encounter.  HISTORICAL INFORMATION:   Selected notes from the MEDICAL RECORD NUMBER Referred by Dr. Dwana Melena for FA LEE: 06.21.19 Matilde Bash) [BCVA: OD: 20/50+2 OS: 20/60+2] Ocular Hx-NPDR with DME OU, pseudo OU, mac scar OS, glaucoma suspect, lamellar mac hole OS PMH-DM (taking Lantus), HTN    CURRENT MEDICATIONS: Current Outpatient Medications (Ophthalmic Drugs)  Medication Sig  . ketorolac (ACULAR) 0.5 % ophthalmic solution   . prednisoLONE acetate (PRED FORTE) 1 % ophthalmic suspension    No current facility-administered medications for this visit.  (Ophthalmic Drugs)   Current Outpatient Medications (Other)  Medication Sig  . acetaminophen (TYLENOL) 325 MG tablet 650 mg 3 times daily.  [name] Kitchen aspirin EC 81 MG tablet 81 mg every morning.  [name] Kitchen atorvastatin (LIPITOR) 10 MG tablet 10 mg nightly.  . fluticasone (FLONASE) 50 MCG/ACT nasal spray 1 spray every morning.  . Insulin Glargine (LANTUS SOLOSTAR) 100 UNIT/ML Solostar Pen 10 Units every morning.  . iron polysaccharides (NIFEREX) 150 MG capsule 150 mg every morning.  . metoprolol tartrate (LOPRESSOR) 25 MG tablet 25 mg 2 times daily.  . mupirocin cream (BACTROBAN) 2 % Apply 2 application  topically 2 (two) times daily.  . polyethylene glycol (MIRALAX / GLYCOLAX) packet 17 g.  . Vitamin D, Ergocalciferol, (DRISDOL) 50000 units CAPS capsule 50,000 Units every 30 days.   No current facility-administered medications for this visit.  (Other)      REVIEW OF SYSTEMS: ROS    Positive for: Endocrine   Negative for: Constitutional, Gastrointestinal, Neurological, Skin, Genitourinary, Musculoskeletal, HENT, Cardiovascular, Eyes, Respiratory, Psychiatric, Allergic/Imm, Heme/Lymph   Last edited by Roselee Nova D on 03/21/2018  2:43 PM. (History)       ALLERGIES No Known Allergies  PAST MEDICAL HISTORY Past Medical History:  Diagnosis Date  . Anxiety   . Aphasia   . CHF (congestive heart failure) ([place])   . Diabetes ([place])   . Herpes virus disease   . Hyperlipemia   . Hypertension   . Insomnia   . Kidney disease   . Major depression, chronic   . Osteoarthritis   . Pneumonia   . Respiratory failure [facility])    Past Surgical History:  Procedure Laterality Date  . BELOW KNEE LEG AMPUTATION    . CATARACT EXTRACTION    . EYE SURGERY      FAMILY HISTORY History reviewed. No pertinent family history.  SOCIAL HISTORY Social History   Tobacco Use  . Smoking status: Never Smoker  . Smokeless tobacco: Never Used  Substance Use Topics  . Alcohol use: Never  Frequency: Never  . Drug use: Never         OPHTHALMIC EXAM:  Base Eye Exam    Visual Acuity (Snellen - Linear)      Right Left   Dist [place] 20/50 +1 20/70 -2   Dist ph [place] NI 20/60 -2       Tonometry (Tonopen, 2:58 PM)      Right Left   Pressure 14 19       Pupils      Dark Light Shape React APD   Right 3 2 Round Brisk None   Left 3 2 Round Brisk None       Visual Fields (Counting fingers)      Left Right    Full Full       Extraocular Movement      Right Left    Full, Ortho Full, Ortho       Neuro/Psych    Oriented x3:  Yes   Mood/Affect:  Normal       Dilation    Both eyes:  1.0%  Mydriacyl, 2.5% Phenylephrine @ 2:59 PM        Slit Lamp and Fundus Exam    Slit Lamp Exam      Right Left   Lids/Lashes Dermatochalasis - upper lid Dermatochalasis - upper lid   Conjunctiva/Sclera White and quiet Mild Melanosis   Cornea Arcus, Temporal Well healed cataract wounds Arcus, 1+ Punctate epithelial erosions   Anterior Chamber Deep and quiet Deep and quiet   Iris Round and dilated Round and dilated   Lens PC IOL in good position PC IOL in good position   Vitreous Vitreous syneresis, Posterior vitreous detachment Vitreous syneresis, Posterior vitreous detachment       Fundus Exam      Right Left   Disc peripapillary pigmentation, no NVD sharp, mild pallor   C/D Ratio 0.6 0.6   Macula Blunted foveal reflex, Microaneurysms, scattered cystic changes Blunted foveal reflex, Epiretinal membrane, pigmented laser scars, scattered Microaneurysms   Vessels Vascular attenuation, Tortuous Severe Vascular attenuation, peripherally scerotic    Periphery Attached, scattered IRH Attached; scattered IRH; early PRP laser changes mostly nasal hemisphere          IMAGING AND PROCEDURES  Imaging and Procedures for _0 @  OCT, Retina - OU - Both Eyes       Right Eye Quality was good. Central Foveal Thickness: 407. Progression has worsened. Findings include abnormal foveal contour, no SRF, intraretinal fluid, outer retinal atrophy.   Left Eye Quality was good. Central Foveal Thickness: (Unable to obtain). Progression has been stable. Findings include intraretinal fluid, abnormal foveal contour, no SRF, outer retinal atrophy (Scattered ORA).   Notes *Images captured and stored on drive  Diagnosis / Impression:  +DME OU  Clinical management:  See below  Abbreviations: NFP - Normal foveal profile. CME - cystoid macular edema. PED - pigment epithelial detachment. IRF - intraretinal fluid. SRF - subretinal fluid. EZ - ellipsoid zone. ERM - epiretinal membrane. ORA - outer retinal  atrophy. ORT - outer retinal tubulation. SRHM - subretinal hyper-reflective material         Panretinal Photocoagulation - OS - Left Eye       LASER PROCEDURE NOTE  Diagnosis:   Severe Non-Proliferative Diabetic Retinopathy, LEFT EYE  Procedure:  Pan-retinal photocoagulation using laser indirect ophthalmoscope, LEFT EYE  Anesthesia:  Topical, subconj lidocaine  Surgeon: Bernarda Caffey, MD, PhD  Informed consent obtained, operative eye marked, and time out performed prior  to initiation of laser.   Lumenis [id number] Laser Indirect Ophthalmoscope Power: 320 mW Duration: 30 msec   # spots: 837 spots 360 fill in, mostly temporal hemisphere  Complications: None.  Notes: pt unable to complete PRP due to pain  RTC: 2-3 wks for PRP OD -- will use subconj lido  Patient tolerated the procedure well and received written and verbal post-procedure care information/education.                 ASSESSMENT/PLAN:    ICD-10-CM   1. 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 Retinal edema H35.81 OCT, Retina - OU - Both Eyes  3. Essential hypertension I10   4. Hypertensive retinopathy of both eyes H35.033   5. Pseudophakia of both eyes Z96.1     1,2. Severe Non-proliferative diabetic retinopathy w/ DME, OU - The incidence, risk factors for progression, natural history and treatment options for diabetic retinopathy  were discussed with patient.   - The need for close monitoring of blood glucose, blood pressure, and serum lipids, avoiding cigarette or any type of tobacco, and the need for long term follow up was also discussed with patient. - exam shows scattered [place] - FA shows scattered MA and capillary nonperfusion OU -- no NV - OCT shows persistent diabetic macular edema OU  - DME being managed by Dr. Manuella Ghazi --  possible Iluvien due to pt's hesitance with possibility of repeat injections - recommend PRP laser OU for peripheral capillary nonperfusion, OS first - S/P incomplete PRP OS (08.20.19)  - Recommend fill in PRP OS with sub-conj lido today (09.03.19) - RBA of procedure discussed, questions answered - informed consent obtained and signed - see procedure note -- laser better tolerated with subconj lido - RTC 2 wks for PRP OD  3,4. Hypertensive retinopathy OU - discussed importance of tight BP control - monitor  5. Pseudophakia OU  - s/p CE/IOL by expert surgeon Dr. Susa Simmonds   - beautiful surgeries  - monitor   Ophthalmic Meds Ordered this visit:  No orders of the defined types were placed in this encounter.      Return in about 2 weeks (around 04/04/2018) for F/U NPDR OU, DFE, OCT, PRP OD (subconj lido).  There are no Patient Instructions on file for this visit.   Explained the diagnoses, plan, and follow up with the patient and they expressed understanding.  Patient expressed understanding of the importance of proper follow up care.   This document serves as a record of services personally performed by Gardiner Sleeper, MD, PhD. It was created on their behalf by Ernest Mallick, OA, an ophthalmic assistant. The creation of this record is the provider's dictation and/or activities during the visit.    Electronically signed by: Ernest Mallick, OA  08.30.2019 4:11 PM    Gardiner Sleeper, M.D., Ph.D. Diseases & Surgery of the Retina and Vitreous [facility]   I have reviewed the above documentation for accuracy and completeness, and I agree with the above. Gardiner Sleeper, M.D., Ph.D. 03/21/18 4:11 PM   Abbreviations: M myopia (nearsighted); A astigmatism; H hyperopia (farsighted); P presbyopia; Mrx spectacle prescription;  CTL contact lenses; OD right eye; OS left eye; OU both eyes  XT exotropia; ET esotropia; PEK punctate epithelial keratitis; PEE punctate  epithelial erosions; DES  dry eye syndrome; MGD meibomian gland dysfunction; ATs artificial tears; PFAT's preservative free artificial tears; [place] nuclear sclerotic cataract; PSC posterior subcapsular cataract; ERM epi-retinal membrane; PVD posterior vitreous detachment; RD retinal detachment; DM diabetes mellitus; DR diabetic retinopathy; NPDR non-proliferative diabetic retinopathy; PDR proliferative diabetic retinopathy; CSME clinically significant macular edema; DME diabetic macular edema; dbh dot blot hemorrhages; CWS cotton wool spot; POAG primary open angle glaucoma; C/D cup-to-disc ratio; HVF humphrey visual field; GVF goldmann visual field; OCT optical coherence tomography; IOP intraocular pressure; BRVO Branch retinal vein occlusion; CRVO central retinal vein occlusion; CRAO central retinal artery occlusion; BRAO branch retinal artery occlusion; RT retinal tear; SB scleral buckle; PPV pars plana vitrectomy; VH Vitreous hemorrhage; PRP panretinal laser photocoagulation; IVK intravitreal kenalog; VMT vitreomacular traction; MH Macular hole;  NVD neovascularization of the disc; NVE neovascularization elsewhere; AREDS age related eye disease study; ARMD age related macular degeneration; POAG primary open angle glaucoma; EBMD epithelial/anterior basement membrane dystrophy; ACIOL anterior chamber intraocular lens; IOL intraocular lens; PCIOL posterior chamber intraocular lens; Phaco/IOL phacoemulsification with intraocular lens placement; [place] photorefractive keratectomy; LASIK laser assisted in situ keratomileusis; HTN hypertension; DM diabetes mellitus; COPD chronic obstructive pulmonary disease

## 2018-03-21 ENCOUNTER — Encounter (INDEPENDENT_AMBULATORY_CARE_PROVIDER_SITE_OTHER): Payer: Self-pay | Admitting: Ophthalmology

## 2018-03-21 ENCOUNTER — Ambulatory Visit (INDEPENDENT_AMBULATORY_CARE_PROVIDER_SITE_OTHER): Payer: 59 | Admitting: Ophthalmology

## 2018-03-21 DIAGNOSIS — H3581 Retinal edema: Secondary | ICD-10-CM

## 2018-03-21 DIAGNOSIS — I10 Essential (primary) hypertension: Secondary | ICD-10-CM

## 2018-03-21 DIAGNOSIS — E113413 Type 2 diabetes mellitus with severe nonproliferative diabetic retinopathy with macular edema, bilateral: Secondary | ICD-10-CM

## 2018-03-21 DIAGNOSIS — H35033 Hypertensive retinopathy, bilateral: Secondary | ICD-10-CM

## 2018-03-21 DIAGNOSIS — Z961 Presence of intraocular lens: Secondary | ICD-10-CM

## 2018-03-23 ENCOUNTER — Ambulatory Visit (INDEPENDENT_AMBULATORY_CARE_PROVIDER_SITE_OTHER): Payer: 59 | Admitting: Physician Assistant

## 2018-03-23 ENCOUNTER — Encounter (INDEPENDENT_AMBULATORY_CARE_PROVIDER_SITE_OTHER): Payer: Self-pay | Admitting: Physician Assistant

## 2018-03-23 DIAGNOSIS — Z89511 Acquired absence of right leg below knee: Secondary | ICD-10-CM

## 2018-03-23 NOTE — Progress Notes (Signed)
Office Visit Note   Patient: Julia Thomas           Date of Birth: [birth year]           MRN: [id number] Visit Date: 03/23/2018              Requested by: No referring provider defined for this encounter. PCP: Patient, No Pcp Per  No chief complaint on file.     HPI: Patient is a very pleasant 77 year old female who presents in a wheelchair with her daughter.  She underwent a right transtibial amputation.  [facility].  She does not ambulate much as she has other medical problems as well as difficulty seeing.  She did recently have cataract surgery and they are hopeful that this may help with her vision.  She developed some blistered areas over the posterior knee and distal thigh when she was wearing her prosthesis during the day all the time.  She does not really ambulates but does utilize her prosthesis for transfers.  She was seen by biotech and they felt the silicone liner might be causing some blistering.  Assessment & Plan: Visit Diagnoses: No diagnosis found.  Plan: The patient will begin Drysol to the posterior knee and thigh of the right transtibial amputation areas at bedtimes x3 nights then 2-3 times per week to keep the posterior knee and thigh areas dryer and hopefully keep them from blistering.  The patient and daughter in agreement to try this and will follow-up in 4 weeks if they see no improvement.  Follow-Up Instructions: No follow-ups on file.   Ortho Exam  Patient is alert, oriented, no adenopathy, well-dressed, normal affect, normal respiratory effort. The right transtibial stump is well-healed.  There is no edema.  She has some evidence of healed blisters over the popliteal fossa area of the right posterior knee and about the thigh where the silicone liner fits.  There is no cellulitis.  No edema.  It is nontender.  Imaging: No results found. No images are attached to the encounter.  Labs: No results found for: HGBA1C, ESRSEDRATE,  CRP, LABURIC, REPTSTATUS, GRAMSTAIN, CULT, LABORGA   No results found for: ALBUMIN, PREALBUMIN, LABURIC  There is no height or weight on file to calculate BMI.  Orders:  No orders of the defined types were placed in this encounter.  No orders of the defined types were placed in this encounter.    Procedures: No procedures performed  Clinical Data: No additional findings.  ROS:  All other systems negative, except as noted in the HPI. Review of Systems  Objective: Vital Signs: There were no vitals taken for this visit.  Specialty Comments:  No specialty comments available.  PMFS History: Patient Active Problem List   Diagnosis Date Noted  . Below knee amputation status, right (HCC) 06/30/2016   Past Medical History:  Diagnosis Date  . Anxiety   . Aphasia   . CHF (congestive heart failure) (HCC)   . Diabetes (HCC)   . Herpes virus disease   . Hyperlipemia   . Hypertension   . Insomnia   . Kidney disease   . Major depression, chronic   . Osteoarthritis   . Pneumonia   . Respiratory failure (HCC)     No family history on file.  Past Surgical History:  Procedure Laterality Date  . BELOW KNEE LEG AMPUTATION    . CATARACT EXTRACTION    . EYE SURGERY     Social History  Occupational History  . Occupation: RETIRED NURSE  Tobacco Use  . Smoking status: Never Smoker  . Smokeless tobacco: Never Used  Substance and Sexual Activity  . Alcohol use: Never    Frequency: Never  . Drug use: Never  . Sexual activity: Not on file

## 2018-04-03 NOTE — Progress Notes (Signed)
[facility] Note  04/04/2018     CHIEF COMPLAINT Patient presents for Retina Follow Up and Diabetic Retinopathy with Macular Edema   HISTORY OF PRESENT ILLNESS: Julia Thomas is a 77 y.o. female who presents to the clinic today for:   HPI    Retina Follow Up    Patient presents with  Diabetic Retinopathy.  In both eyes.  This started 1 month ago.  Severity is mild.  Since onset it is stable.  I, the attending physician,  performed the HPI with the patient and updated documentation appropriately.          Comments    F/U PDR OU. Patient states her vision is the same last oV,ocassional floaters os , denies new visual onsets. Bs 114 this am, denies hypo/hyper glycemic episodes. Pf OS completed as instructed.       Last edited by Bernarda Caffey, MD on 04/06/2018 12:12 AM. (History)    Pt here for PRP OD  Referring physician: Feliz Beam, [place], [place] [id number]  HISTORICAL INFORMATION:   Selected notes from the MEDICAL RECORD NUMBER Referred by Dr. Dwana Melena for FA LEE: 06.21.19 Matilde Bash) [BCVA: OD: 20/50+2 OS: 20/60+2] Ocular Hx-NPDR with DME OU, pseudo OU, mac scar OS, glaucoma suspect, lamellar mac hole OS PMH-DM (taking Lantus), HTN    CURRENT MEDICATIONS: Current Outpatient Medications (Ophthalmic Drugs)  Medication Sig  . ketorolac (ACULAR) 0.5 % ophthalmic solution   . prednisoLONE acetate (PRED FORTE) 1 % ophthalmic suspension    No current facility-administered medications for this visit.  (Ophthalmic Drugs)   Current Outpatient Medications (Other)  Medication Sig  . acetaminophen (TYLENOL) 325 MG tablet 650 mg 3 times daily.  [name] Kitchen aspirin EC 81 MG tablet 81 mg every morning.  [name] Kitchen atorvastatin (LIPITOR) 10 MG tablet 10 mg nightly.  . fluticasone (FLONASE) 50 MCG/ACT nasal spray 1 spray every morning.  . Insulin Glargine (LANTUS SOLOSTAR) 100 UNIT/ML Solostar Pen 10 Units every morning.  . iron  polysaccharides (NIFEREX) 150 MG capsule 150 mg every morning.  . metoprolol tartrate (LOPRESSOR) 25 MG tablet 25 mg 2 times daily.  . mupirocin cream (BACTROBAN) 2 % Apply 2 application topically 2 (two) times daily.  . polyethylene glycol (MIRALAX / GLYCOLAX) packet 17 g.  . Vitamin D, Ergocalciferol, (DRISDOL) 50000 units CAPS capsule 50,000 Units every 30 days.   No current facility-administered medications for this visit.  (Other)      REVIEW OF SYSTEMS: ROS    Positive for: Endocrine, Eyes   Negative for: Constitutional, Gastrointestinal, Neurological, Skin, Genitourinary, Musculoskeletal, HENT, Cardiovascular, Respiratory, Psychiatric, Allergic/Imm, Heme/Lymph   Last edited by Roselee Nova D on 04/04/2018  3:20 PM. (History)       ALLERGIES No Known Allergies  PAST MEDICAL HISTORY Past Medical History:  Diagnosis Date  . Anxiety   . Aphasia   . CHF (congestive heart failure) ([place])   . Diabetes ([place])   . Herpes virus disease   . Hyperlipemia   . Hypertension   . Insomnia   . Kidney disease   . Major depression, chronic   . Osteoarthritis   . Pneumonia   . Respiratory failure [facility])    Past Surgical History:  Procedure Laterality Date  . BELOW KNEE LEG AMPUTATION    . CATARACT EXTRACTION    . EYE SURGERY      FAMILY HISTORY History reviewed. No pertinent family history.  SOCIAL HISTORY Social History  Tobacco Use  . Smoking status: Never Smoker  . Smokeless tobacco: Never Used  Substance Use Topics  . Alcohol use: Never    Frequency: Never  . Drug use: Never         OPHTHALMIC EXAM:  Base Eye Exam    Visual Acuity (Snellen - Linear)      Right Left   Dist cc 20/40 -2 20/60 -2   Dist ph cc 20/40 20/60 -1   Correction:  Glasses       Tonometry (Tonopen, 3:37 PM)      Right Left   Pressure 16 17       Pupils      Dark Light Shape React APD   Right 4 3 Round Brisk None   Left 4 3 Round Brisk None       Visual Fields (Counting  fingers)      Left Right    Full Full       Extraocular Movement      Right Left    Full Full       Neuro/Psych    Oriented x3:  Yes   Mood/Affect:  Normal       Dilation    Both eyes:  1.0% Mydriacyl, 2.5% Phenylephrine @ 3:37 PM        Slit Lamp and Fundus Exam    Slit Lamp Exam      Right Left   Lids/Lashes Dermatochalasis - upper lid Dermatochalasis - upper lid   Conjunctiva/Sclera White and quiet Mild Melanosis   Cornea Arcus, Temporal Well healed cataract wounds Arcus, 1+ Punctate epithelial erosions   Anterior Chamber Deep and quiet Deep and quiet   Iris Round and dilated Round and dilated   Lens PC IOL in good position PC IOL in good position   Vitreous Vitreous syneresis, Posterior vitreous detachment Vitreous syneresis, Posterior vitreous detachment       Fundus Exam      Right Left   Disc peripapillary pigmentation, no NVD sharp, mild pallor   C/D Ratio 0.6 0.6   Macula Blunted foveal reflex, Microaneurysms, scattered cystic changes Blunted foveal reflex, Epiretinal membrane, pigmented laser scars, scattered Microaneurysms   Vessels Vascular attenuation, Tortuous Severe Vascular attenuation, peripherally scerotic    Periphery Attached, scattered IRH Attached; scattered IRH; PRP scars          IMAGING AND PROCEDURES  Imaging and Procedures for @TODAY @  OCT, Retina - OU - Both Eyes       Right Eye Quality was good. Central Foveal Thickness: 280. Findings include abnormal foveal contour, no SRF, intraretinal fluid, outer retinal atrophy.   Left Eye Quality was good. Central Foveal Thickness: 334. Findings include intraretinal fluid, abnormal foveal contour, no SRF, outer retinal atrophy (Scattered ORA).   Notes *Images captured and stored on drive  Diagnosis / Impression:  +DME OU  Clinical management:  See below  Abbreviations: NFP - Normal foveal profile. CME - cystoid macular edema. PED - pigment epithelial detachment. IRF - intraretinal  fluid. SRF - subretinal fluid. EZ - ellipsoid zone. ERM - epiretinal membrane. ORA - outer retinal atrophy. ORT - outer retinal tubulation. SRHM - subretinal hyper-reflective material         Panretinal Photocoagulation - OD - Right Eye       LASER PROCEDURE NOTE  Diagnosis:   Severe nonproliferative Diabetic Retinopathy, RIGHT EYE  Procedure:  Pan-retinal photocoagulation using slit lamp laser, RIGHT EYE  Anesthesia:  Topical, Subconjunctival block  Surgeon: Aaron Edelman  Coralyn Pear, MD, PhD   Informed consent obtained, operative eye marked, and time out performed prior to initiation of laser.   Lumenis [id number] Laser Indirect Ophthalmoscope Power: 290 mW Duration: 70 msec   # spots: 865 spots -- unable to complete temporal laser due to pt cooperation / discomfort  Complications: None.  RTC: 3 wks for PRP completion OD  Patient tolerated the procedure well and received written and verbal post-procedure care information/education.                 ASSESSMENT/PLAN:    ICD-10-CM   1. 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 Retinal edema H35.81 OCT, Retina - OU - Both Eyes  3. Essential hypertension I10   4. Hypertensive retinopathy of both eyes H35.033   5. Pseudophakia of both eyes Z96.1     1,2. Severe Non-proliferative diabetic retinopathy w/ DME, OU - pt of Dr. Manuella Ghazi -- referred for FA and PRP laser OU - exam shows scattered [place] - FA shows scattered MA and capillary nonperfusion OU -- no NV - OCT shows persistent diabetic macular edema OU  - DME being managed by Dr. Manuella Ghazi -- possible Iluvien due to pt's hesitance with possibility of repeat injections  Pt scheduled to see Manuella Ghazi on 10.8.19 - S/P incomplete PRP OS (08.20.19), fill-in (09.03.19) - Recommend PRP OD today (09.17.19) - RBA of procedure  discussed, questions answered - informed consent obtained and signed - see procedure note -- laser better tolerated with subconj lido, but unable to complete PRP laser OD today - RTC 2-3 wks for fill in PRP OD  3,4. Hypertensive retinopathy OU - discussed importance of tight BP control - monitor  5. Pseudophakia OU  - s/p CE/IOL by expert surgeon Dr. Susa Simmonds   - beautiful surgeries  - monitor   Ophthalmic Meds Ordered this visit:  No orders of the defined types were placed in this encounter.      Return in about 3 weeks (around 04/25/2018) for Laser PRP fill in OD.  There are no Patient Instructions on file for this visit.   Explained the diagnoses, plan, and follow up with the patient and they expressed understanding.  Patient expressed understanding of the importance of proper follow up care.   This document serves as a record of services personally performed by Gardiner Sleeper, MD, PhD. It was created on their behalf by Catha Brow, [place], a certified ophthalmic assistant. The creation of this record is the provider's dictation and/or activities during the visit.  Electronically signed by: Catha Brow, COA  09.16.19 12:14 AM   Gardiner Sleeper, M.D., Ph.D. Diseases & Surgery of the Retina and Vitreous [facility]  I have reviewed the above documentation for accuracy and completeness, and I agree with the above. Gardiner Sleeper, M.D., Ph.D. 04/06/18 12:17 AM    Abbreviations: M myopia (nearsighted); A astigmatism; H hyperopia (farsighted); P presbyopia; Mrx spectacle prescription;  CTL contact lenses; OD right eye; OS left eye; OU both eyes  XT exotropia; ET esotropia; PEK punctate epithelial keratitis; PEE punctate epithelial erosions; DES dry eye syndrome; MGD meibomian gland dysfunction; ATs artificial tears; PFAT's preservative free artificial tears; [place] nuclear sclerotic cataract; PSC posterior subcapsular cataract; ERM epi-retinal  membrane; PVD posterior vitreous detachment; RD retinal detachment; DM diabetes mellitus; DR diabetic  retinopathy; NPDR non-proliferative diabetic retinopathy; PDR proliferative diabetic retinopathy; CSME clinically significant macular edema; DME diabetic macular edema; dbh dot blot hemorrhages; CWS cotton wool spot; POAG primary open angle glaucoma; C/D cup-to-disc ratio; HVF humphrey visual field; GVF goldmann visual field; OCT optical coherence tomography; IOP intraocular pressure; BRVO Branch retinal vein occlusion; CRVO central retinal vein occlusion; CRAO central retinal artery occlusion; BRAO branch retinal artery occlusion; RT retinal tear; SB scleral buckle; PPV pars plana vitrectomy; VH Vitreous hemorrhage; PRP panretinal laser photocoagulation; IVK intravitreal kenalog; VMT vitreomacular traction; MH Macular hole;  NVD neovascularization of the disc; NVE neovascularization elsewhere; AREDS age related eye disease study; ARMD age related macular degeneration; POAG primary open angle glaucoma; EBMD epithelial/anterior basement membrane dystrophy; ACIOL anterior chamber intraocular lens; IOL intraocular lens; PCIOL posterior chamber intraocular lens; Phaco/IOL phacoemulsification with intraocular lens placement; [place] photorefractive keratectomy; LASIK laser assisted in situ keratomileusis; HTN hypertension; DM diabetes mellitus; COPD chronic obstructive pulmonary disease

## 2018-04-04 ENCOUNTER — Ambulatory Visit (INDEPENDENT_AMBULATORY_CARE_PROVIDER_SITE_OTHER): Payer: 59 | Admitting: Ophthalmology

## 2018-04-04 ENCOUNTER — Encounter (INDEPENDENT_AMBULATORY_CARE_PROVIDER_SITE_OTHER): Payer: Self-pay | Admitting: Ophthalmology

## 2018-04-04 DIAGNOSIS — H3581 Retinal edema: Secondary | ICD-10-CM | POA: Diagnosis not present

## 2018-04-04 DIAGNOSIS — Z961 Presence of intraocular lens: Secondary | ICD-10-CM

## 2018-04-04 DIAGNOSIS — E113413 Type 2 diabetes mellitus with severe nonproliferative diabetic retinopathy with macular edema, bilateral: Secondary | ICD-10-CM

## 2018-04-04 DIAGNOSIS — H35033 Hypertensive retinopathy, bilateral: Secondary | ICD-10-CM

## 2018-04-04 DIAGNOSIS — I10 Essential (primary) hypertension: Secondary | ICD-10-CM

## 2018-04-06 ENCOUNTER — Encounter (INDEPENDENT_AMBULATORY_CARE_PROVIDER_SITE_OTHER): Payer: Self-pay | Admitting: Ophthalmology

## 2018-04-21 NOTE — Progress Notes (Signed)
[facility] Note  05/01/2018     CHIEF COMPLAINT Patient presents for Retina Follow Up   HISTORY OF PRESENT ILLNESS: Julia Thomas is a 77 y.o. female who presents to the clinic today for:   HPI    Retina Follow Up    Patient presents with  Diabetic Retinopathy.  In both eyes.  This started 2 months ago.  Severity is severe.  Duration of 1 month.  Since onset it is stable.  I, the attending physician,  performed the HPI with the patient and updated documentation appropriately.          Comments    Patient states vision the same OU. CBG today was around 115 this am. Last A1c level unknown.        Last edited by Bernarda Caffey, MD on 05/01/2018  4:36 PM. (History)    Pt here for PRP OD  Referring physician: No referring provider defined for this encounter.  HISTORICAL INFORMATION:   Selected notes from the MEDICAL RECORD NUMBER Referred by Dr. Dwana Melena for FA LEE: 06.21.19 Matilde Bash) [BCVA: OD: 20/50+2 OS: 20/60+2] Ocular Hx-NPDR with DME OU, pseudo OU, mac scar OS, glaucoma suspect, lamellar mac hole OS PMH-DM (taking Lantus), HTN    CURRENT MEDICATIONS: Current Outpatient Medications (Ophthalmic Drugs)  Medication Sig  . ketorolac (ACULAR) 0.5 % ophthalmic solution   . prednisoLONE acetate (PRED FORTE) 1 % ophthalmic suspension    No current facility-administered medications for this visit.  (Ophthalmic Drugs)   Current Outpatient Medications (Other)  Medication Sig  . acetaminophen (TYLENOL) 325 MG tablet 650 mg 3 times daily.  [name] Kitchen aspirin EC 81 MG tablet 81 mg every morning.  [name] Kitchen atorvastatin (LIPITOR) 10 MG tablet 10 mg nightly.  . fluticasone (FLONASE) 50 MCG/ACT nasal spray 1 spray every morning.  . Insulin Glargine (LANTUS SOLOSTAR) 100 UNIT/ML Solostar Pen 10 Units every morning.  . iron polysaccharides (NIFEREX) 150 MG capsule 150 mg every morning.  . metoprolol tartrate (LOPRESSOR) 25 MG tablet 25 mg 2 times daily.  .  mupirocin cream (BACTROBAN) 2 % Apply 2 application topically 2 (two) times daily.  . polyethylene glycol (MIRALAX / GLYCOLAX) packet 17 g.  . polyethylene glycol (MIRALAX / GLYCOLAX) packet Take 17 g by mouth as directed.  . Vitamin D, Ergocalciferol, (DRISDOL) 50000 units CAPS capsule 50,000 Units every 30 days.   No current facility-administered medications for this visit.  (Other)      REVIEW OF SYSTEMS: ROS    Positive for: Endocrine, Eyes   Negative for: Constitutional, Gastrointestinal, Neurological, Skin, Genitourinary, Musculoskeletal, HENT, Cardiovascular, Respiratory, Psychiatric, Allergic/Imm, Heme/Lymph   Last edited by Roselee Nova D on 05/01/2018  2:41 PM. (History)       ALLERGIES No Known Allergies  PAST MEDICAL HISTORY Past Medical History:  Diagnosis Date  . Anxiety   . Aphasia   . CHF (congestive heart failure) ([place])   . Diabetes ([place])   . Herpes virus disease   . Hyperlipemia   . Hypertension   . Insomnia   . Kidney disease   . Major depression, chronic   . Osteoarthritis   . Pneumonia   . Respiratory failure [facility])    Past Surgical History:  Procedure Laterality Date  . BELOW KNEE LEG AMPUTATION    . CATARACT EXTRACTION    . EYE SURGERY      FAMILY HISTORY History reviewed. No pertinent family history.  SOCIAL HISTORY Social History   Tobacco  Use  . Smoking status: Never Smoker  . Smokeless tobacco: Never Used  Substance Use Topics  . Alcohol use: Never    Frequency: Never  . Drug use: Never         OPHTHALMIC EXAM:  Base Eye Exam    Visual Acuity (Snellen - Linear)      Right Left   Dist [place] 20/50 20/60 -2   Dist ph  NI NI       Tonometry (Tonopen, 2:53 PM)      Right Left   Pressure 14 13       Pupils      Dark Light Shape React APD   Right 4 3 Round Brisk None   Left 4 3 Round Brisk None       Visual Fields (Counting fingers)      Left Right    Full Full       Extraocular Movement      Right Left     Full, Ortho Full, Ortho       Neuro/Psych    Oriented x3:  Yes   Mood/Affect:  Normal       Dilation    Both eyes:  2.5% Phenylephrine, 1.0% Mydriacyl @ 2:53 PM        Slit Lamp and Fundus Exam    Slit Lamp Exam      Right Left   Lids/Lashes Dermatochalasis - upper lid Dermatochalasis - upper lid   Conjunctiva/Sclera White and quiet Mild Melanosis   Cornea Arcus, Temporal Well healed cataract wounds Arcus, 1+ Punctate epithelial erosions   Anterior Chamber Deep and quiet Deep and quiet   Iris Round and dilated Round and dilated   Lens PC IOL in good position PC IOL in good position   Vitreous Vitreous syneresis, Posterior vitreous detachment Vitreous syneresis, Posterior vitreous detachment       Fundus Exam      Right Left   Disc peripapillary pigmentation, no NVD sharp, mild pallor   C/D Ratio 0.6 0.6   Macula Blunted foveal reflex, Microaneurysms, scattered cystic changes Blunted foveal reflex, Epiretinal membrane, pigmented laser scars, scattered Microaneurysms   Vessels Vascular attenuation, Tortuous Severe Vascular attenuation, peripherally scerotic    Periphery Attached, scattered IRH; 360 PRP w/ room for fill in temporally Attached; scattered IRH; good PRP scars 360          IMAGING AND PROCEDURES  Imaging and Procedures for _0 @  OCT, Retina - OU - Both Eyes       Right Eye Quality was good. Central Foveal Thickness: 317. Progression has worsened. Findings include abnormal foveal contour, no SRF, intraretinal fluid, outer retinal atrophy (Interval increase in IRF).   Left Eye Quality was good. Central Foveal Thickness: 320. Findings include intraretinal fluid, abnormal foveal contour, no SRF, outer retinal atrophy (Scattered ORA).   Notes *Images captured and stored on drive  Diagnosis / Impression:  +DME OU OD w/ Interval increase in IRF  Clinical management:  See below  Abbreviations: NFP - Normal foveal profile. CME - cystoid macular edema.  PED - pigment epithelial detachment. IRF - intraretinal fluid. SRF - subretinal fluid. EZ - ellipsoid zone. ERM - epiretinal membrane. ORA - outer retinal atrophy. ORT - outer retinal tubulation. SRHM - subretinal hyper-reflective material         Panretinal Photocoagulation - OD - Right Eye       LASER PROCEDURE NOTE  Diagnosis:   Severe nonproliferative Diabetic Retinopathy, RIGHT EYE  Procedure:  Pan-retinal photocoagulation using laser indirect ophthalmoscope, RIGHT EYE  Anesthesia:  Topical  Surgeon: Bernarda Caffey, MD, PhD   Informed consent obtained, operative eye marked, and time out performed prior to initiation of laser.   L[facility]is [id number] Laser Indirect Ophthalmoscope Power: 280 mW Duration: 70 msec   # spots: 392 spots temporal fill in  Complications: None.  RTC: 3-4 wks  Patient tolerated the procedure well and received written and verbal post-procedure care information/education.                 ASSESSMENT/PLAN:    ICD-10-CM   1. 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 Retinal edema H35.81 OCT, Retina - OU - Both Eyes  3. Essential hypertension I10   4. Hypertensive retinopathy of both eyes H35.033   5. Pseudophakia of both eyes Z96.1     1,2. Severe Non-proliferative diabetic retinopathy w/ DME, OU - pt of Dr. Manuella Ghazi -- referred for FA and PRP laser OU - exam shows scattered [place] - FA shows scattered MA and capillary nonperfusion OU -- no NV - OCT shows persistent diabetic macular edema OU  - DME being managed by Dr. Manuella Ghazi -- possible Iluvien due to pt's hesitance with possibility of repeat injections  Pt saw Manuella Ghazi on 10.8.19 -- discussed fully switching care over to [facility]  Will speak with [employer] rep, Linna Hoff Ebbing re: benefits investigation - S/P incomplete PRP OS  (08.20.19), fill-in (09.03.19) - S/P incomplete PRP OD (09.17.19) - Recommend PRP OD fill in today (10.14.19) - RBA of procedure discussed, questions answered - informed consent obtained and signed - see procedure note - RTC 3-4 wks for repeat exam, OCT, possible PRP fill in vs repeat FA  3,4. Hypertensive retinopathy OU - discussed importance of tight BP control - monitor  5. Pseudophakia OU  - s/p CE/IOL by expert surgeon Dr. Susa Simmonds   - beautiful surgeries  - monitor   Ophthalmic Meds Ordered this visit:  No orders of the defined types were placed in this encounter.      Return for 3-4 wks;, Dilated Exam, OCT, Laser.  There are no Patient Instructions on file for this visit.   Explained the diagnoses, plan, and follow up with the patient and they expressed understanding.  Patient expressed understanding of the importance of proper follow up care.     Gardiner Sleeper, M.D., Ph.D. Diseases & Surgery of the Retina and Vitreous [facility]   I have reviewed the above documentation for accuracy and completeness, and I agree with the above. Gardiner Sleeper, M.D., Ph.D. 05/01/18 5:20 PM    Abbreviations: M myopia (nearsighted); A astigmatism; H hyperopia (farsighted); P presbyopia; Mrx spectacle prescription;  CTL contact lenses; OD right eye; OS left eye; OU both eyes  XT exotropia; ET esotropia; PEK punctate epithelial keratitis; PEE punctate epithelial erosions; DES dry eye syndrome; MGD meibomian gland dysfunction; ATs artificial tears; PFAT's preservative free artificial tears; [place] nuclear sclerotic cataract; PSC posterior subcapsular cataract; ERM epi-retinal membrane; PVD posterior vitreous detachment; RD retinal detachment; DM diabetes mellitus; DR diabetic retinopathy; NPDR non-proliferative diabetic retinopathy; PDR proliferative diabetic retinopathy; CSME clinically significant macular edema; DME diabetic macular edema; dbh dot blot  hemorrhages; CWS cotton wool spot; POAG primary open angle glaucoma; C/D cup-to-disc ratio; HVF humphrey visual field; GVF goldmann visual  field; OCT optical coherence tomography; IOP intraocular pressure; BRVO Branch retinal vein occlusion; CRVO central retinal vein occlusion; CRAO central retinal artery occlusion; BRAO branch retinal artery occlusion; RT retinal tear; SB scleral buckle; PPV pars plana vitrectomy; VH Vitreous hemorrhage; PRP panretinal laser photocoagulation; IVK intravitreal kenalog; VMT vitreomacular traction; MH Macular hole;  NVD neovascularization of the disc; NVE neovascularization elsewhere; AREDS age related eye disease study; ARMD age related macular degeneration; POAG primary open angle glaucoma; EBMD epithelial/anterior basement membrane dystrophy; ACIOL anterior chamber intraocular lens; IOL intraocular lens; PCIOL posterior chamber intraocular lens; Phaco/IOL phacoemulsification with intraocular lens placement; [place] photorefractive keratectomy; LASIK laser assisted in situ keratomileusis; HTN hypertension; DM diabetes mellitus; COPD chronic obstructive pulmonary disease

## 2018-05-01 ENCOUNTER — Encounter (INDEPENDENT_AMBULATORY_CARE_PROVIDER_SITE_OTHER): Payer: Self-pay | Admitting: Ophthalmology

## 2018-05-01 ENCOUNTER — Ambulatory Visit (INDEPENDENT_AMBULATORY_CARE_PROVIDER_SITE_OTHER): Payer: 59 | Admitting: Ophthalmology

## 2018-05-01 DIAGNOSIS — I10 Essential (primary) hypertension: Secondary | ICD-10-CM

## 2018-05-01 DIAGNOSIS — E113413 Type 2 diabetes mellitus with severe nonproliferative diabetic retinopathy with macular edema, bilateral: Secondary | ICD-10-CM | POA: Diagnosis not present

## 2018-05-01 DIAGNOSIS — Z961 Presence of intraocular lens: Secondary | ICD-10-CM

## 2018-05-01 DIAGNOSIS — H3581 Retinal edema: Secondary | ICD-10-CM | POA: Diagnosis not present

## 2018-05-01 DIAGNOSIS — H35033 Hypertensive retinopathy, bilateral: Secondary | ICD-10-CM

## 2018-05-18 NOTE — Progress Notes (Signed)
Pt left before being seen by MD  Karie Chimera, M.D., Ph.D. Diseases & Surgery of the Retina and Vitreous [facility] 05/22/18

## 2018-05-22 ENCOUNTER — Encounter (INDEPENDENT_AMBULATORY_CARE_PROVIDER_SITE_OTHER): Payer: Self-pay | Admitting: Ophthalmology

## 2018-05-22 ENCOUNTER — Ambulatory Visit (INDEPENDENT_AMBULATORY_CARE_PROVIDER_SITE_OTHER): Payer: 59 | Admitting: Ophthalmology

## 2018-05-22 DIAGNOSIS — E113413 Type 2 diabetes mellitus with severe nonproliferative diabetic retinopathy with macular edema, bilateral: Secondary | ICD-10-CM

## 2018-05-22 DIAGNOSIS — I10 Essential (primary) hypertension: Secondary | ICD-10-CM

## 2018-05-22 DIAGNOSIS — Z961 Presence of intraocular lens: Secondary | ICD-10-CM

## 2018-05-22 DIAGNOSIS — H35033 Hypertensive retinopathy, bilateral: Secondary | ICD-10-CM

## 2018-05-22 DIAGNOSIS — H3581 Retinal edema: Secondary | ICD-10-CM

## 2018-05-29 NOTE — Progress Notes (Signed)
[facility] Note  05/30/2018     CHIEF COMPLAINT Patient presents for Retina Follow Up   HISTORY OF PRESENT ILLNESS: Julia Thomas is a 77 y.o. female who presents to the clinic today for:   HPI    Retina Follow Up    Patient presents with  Diabetic Retinopathy.  In both eyes.  This started 3 months ago.  Severity is mild.  Since onset it is stable.  I, the attending physician,  performed the HPI with the patient and updated documentation appropriately.          Comments    F/U NPDR w/DME OU. Patient states her vision is "about the same since last OV, denies flashes, floaters and pain.BS 120, denies hypo/hyperglycemic episodes. Pt is ready for tx today if indicated.       Last edited by Bernarda Caffey, MD on 05/30/2018  8:56 AM. (History)    pt states vision is about the same as last time, pt states she has had previous injections from a Dr in [place], [place]  Referring physician: No referring provider defined for this encounter.  HISTORICAL INFORMATION:   Selected notes from the MEDICAL RECORD NUMBER Referred by Dr. Dwana Melena for FA LEE: 06.21.19 Matilde Bash) [BCVA: OD: 20/50+2 OS: 20/60+2] Ocular Hx-NPDR with DME OU, pseudo OU, mac scar OS, glaucoma suspect, lamellar mac hole OS PMH-DM (taking Lantus), HTN    CURRENT MEDICATIONS: Current Outpatient Medications (Ophthalmic Drugs)  Medication Sig  . ketorolac (ACULAR) 0.5 % ophthalmic solution   . prednisoLONE acetate (PRED FORTE) 1 % ophthalmic suspension    Current Facility-Administered Medications (Ophthalmic Drugs)  Medication Route  . triamcinolone acetonide (TRIESENCE) 40 MG/ML subtenons injection 4 mg Intravitreal   Current Outpatient Medications (Other)  Medication Sig  . acetaminophen (TYLENOL) 325 MG tablet 650 mg 3 times daily.  [name] Kitchen aspirin EC 81 MG tablet 81 mg every morning.  [name] Kitchen atorvastatin (LIPITOR) 10 MG tablet 10 mg nightly.  . cefTRIAXone (ROCEPHIN) 1 g injection   .  fluticasone (FLONASE) 50 MCG/ACT nasal spray 1 spray every morning.  . Insulin Glargine (LANTUS SOLOSTAR) 100 UNIT/ML Solostar Pen 10 Units every morning.  . iron polysaccharides (NIFEREX) 150 MG capsule 150 mg every morning.  . lidocaine (XYLOCAINE) 1 % (with preservative) injection   . metoprolol tartrate (LOPRESSOR) 25 MG tablet 25 mg 2 times daily.  . mupirocin cream (BACTROBAN) 2 % Apply 2 application topically 2 (two) times daily.  . polyethylene glycol (MIRALAX / GLYCOLAX) packet 17 g.  . polyethylene glycol (MIRALAX / GLYCOLAX) packet Take 17 g by mouth as directed.  . Vitamin D, Ergocalciferol, (DRISDOL) 50000 units CAPS capsule 50,000 Units every 30 days.   No current facility-administered medications for this visit.  (Other)      REVIEW OF SYSTEMS: ROS    Positive for: Endocrine, Eyes   Negative for: Constitutional, Gastrointestinal, Neurological, Skin, Genitourinary, Musculoskeletal, HENT, Cardiovascular, Respiratory, Psychiatric, Allergic/Imm, Heme/Lymph   Last edited by Zenovia Jordan, LPN on 47/42/5956  3:87 AM. (History)       ALLERGIES No Known Allergies  PAST MEDICAL HISTORY Past Medical History:  Diagnosis Date  . Anxiety   . Aphasia   . CHF (congestive heart failure) ([place])   . Diabetes ([place])   . Herpes virus disease   . Hyperlipemia   . Hypertension   . Insomnia   . Kidney disease   . Major depression, chronic   . Osteoarthritis   . Pneumonia   .  Respiratory failure [facility])    Past Surgical History:  Procedure Laterality Date  . BELOW KNEE LEG AMPUTATION    . CATARACT EXTRACTION    . EYE SURGERY      FAMILY HISTORY History reviewed. No pertinent family history.  SOCIAL HISTORY Social History   Tobacco Use  . Smoking status: Never Smoker  . Smokeless tobacco: Never Used  Substance Use Topics  . Alcohol use: Never    Frequency: Never  . Drug use: Never         OPHTHALMIC EXAM:  Base Eye Exam    Visual Acuity (Snellen - Linear)       Right Left   Dist cc 20/80 20/80   Dist ph cc NI NI   Correction:  Glasses       Tonometry (Tonopen, 8:38 AM)      Right Left   Pressure 17 15       Pupils      Dark Light Shape React APD   Right 3 2 Round Brisk None   Left 3 2 Round Brisk None       Visual Fields      Left Right    Full Full       Extraocular Movement      Right Left    Full, Ortho Full, Ortho       Neuro/Psych    Oriented x3:  Yes   Mood/Affect:  Normal       Dilation    Both eyes:  1.0% Mydriacyl, 2.5% Phenylephrine @ 8:38 AM        Slit Lamp and Fundus Exam    Slit Lamp Exam      Right Left   Lids/Lashes Dermatochalasis - upper lid Dermatochalasis - upper lid   Conjunctiva/Sclera White and quiet Mild Melanosis   Cornea Arcus, Temporal Well healed cataract wounds Arcus, 1+ Punctate epithelial erosions   Anterior Chamber Deep and quiet Deep and quiet   Iris Round and dilated Round and dilated   Lens PC IOL in good position PC IOL in good position   Vitreous Vitreous syneresis, Posterior vitreous detachment Vitreous syneresis, Posterior vitreous detachment       Fundus Exam      Right Left   Disc 360 peripapillary pigmentation, no NVD 360 PPP   C/D Ratio 0.6 0.6   Macula flat, Blunted foveal reflex, Microaneurysms, scattered cystic changes Flat, Blunted foveal reflex, Epiretinal membrane, cystic changes, pigmented laser scars, scattered Microaneurysms   Vessels Vascular attenuation, Tortuous Severe Vascular attenuation, peripherally scerotic, focal area of fibrosis along IT arcade   Periphery Attached, scattered IRH; good 360 PRP  Attached; scattered IRH; good PRP scars 360          IMAGING AND PROCEDURES  Imaging and Procedures for _0 @  Intravitreal Injection, Pharmacologic Agent - OS - Left Eye       Time Out 05/30/2018. 9:38 AM. Confirmed correct patient, procedure, site, and patient consented.   Anesthesia Topical anesthesia was used. Anesthetic medications  included Lidocaine 2%, Proparacaine 0.5%.   Procedure Preparation included 5% betadine to ocular surface, eyelid speculum. A 27 gauge needle was used.   Injection:  4 mg triamcinolone acetonide 40 MG/ML   NDC: 5784-6962-95, Lot: [id number], Expiration date: 09/16/2019   Route: Intravitreal, Site: Left Eye, Waste: 0.9 mL  Post-op Post injection exam found visual acuity of at least counting fingers. The patient tolerated the procedure well. There were no complications. The patient received written and verbal post  procedure care education.   Notes PROCEDURE NOTE  PREOPERATIVE DIAGNOSIS:      Diabetic macular edema, LEFT eye  PROCEDURE:              Injection of Intravitreal Triesence, LEFT Eye        SURGEON:             Bernarda Caffey, MD/PhD  ANESTHESIA:         Topical                                                              PROCEDURE:     The various treatment options for Diabetic Macular Edema were discussed with the patient. The risks, benefits and alternatives of intravitreal Triesence injection were discussed. The eye was marked and a time out was performed identifying the correct eye.                                  After successful induction of topical anesthesia with proparacaine drops, a lid speculum was placed. 4% Lidocaine solution was instilled on the inferotemporal quadrant using applicator sticks and the eye was prepped with Betadine swabs.  Subsequently, 0.1 cc of 40 mg/cc Triescence (4 mg) was injected into the vitreous cavity utilizing a 27-gauge needle. There were no complications of the injection.    An AC tap was performed following injection due to elevated IOP using a 30 gauge needle on a syringe with the plunger removed. The needle was placed at the limbus at 5 oclock and approximately 0.09 cc of aqueous was removed from the anterior chamber. Betadine was applied to the tap area before and after the paracentesis was performed. There were no complications. The patient  tolerated the procedure well. The IOP was rechecked and was found to be 6 mmHg by tonopen.  The central retinal artery was checked and found to be patent.  The triamcinolone was identified in the vitreous cavity.  The patient had no complications and the discharge instructions were reviewed.                  ASSESSMENT/PLAN:    ICD-10-CM   1. Severe nonproliferative diabetic retinopathy of both eyes with macular edema associated with type 2 diabetes mellitus (HCC) O17.5102 Intravitreal Injection, Pharmacologic Agent - OS - Left Eye    triamcinolone acetonide (TRIESENCE) 40 MG/ML subtenons injection 4 mg  2. Retinal edema H35.81 CANCELED: OCT, Retina - OU - Both Eyes  3. Essential hypertension I10   4. Hypertensive retinopathy of both eyes H35.033   5. Pseudophakia of both eyes Z96.1     1,2. Severe Non-proliferative diabetic retinopathy w/ DME, OU - pt of Dr. Manuella Ghazi -- referred for FA and PRP laser OU - exam shows scattered [place] - FA shows scattered MA and capillary nonperfusion OU -- no NV - OCT shows persistent diabetic macular edema OU  - DME was being managed previously by Dr. Manuella Ghazi -- possible Iluvien due to pt's hesitance with possibility of repeat injections - Pt saw Manuella Ghazi on 10.8.19 -- discussed fully switching care over to [facility] - Speaking with [employer] rep, Pilar Plate re: benefits investigation - S/P PRP OS (08.20.19), fill-in (09.03.19) - S/P PRP OD (09.17.19),  fill-in (10.14.19) - recommend intravitreal Triesence OS today, 11.12.19 as a steroid challenge for Iluvien - F/U 6 weeks for DFE/OCT and IOP check  3,4. Hypertensive retinopathy OU - discussed importance of tight BP control - monitor  5. Pseudophakia OU  - s/p CE/IOL by expert surgeon Dr. Susa Simmonds   - beautiful surgeries  - monitor   Ophthalmic Meds Ordered this visit:  Meds ordered this encounter  Medications  . triamcinolone acetonide (TRIESENCE) 40 MG/ML subtenons injection 4 mg       Return  in about 6 weeks (around 07/10/2018) for F/U 07/10/18 NPDR OU, DFE, OCT.  There are no Patient Instructions on file for this visit.   Explained the diagnoses, plan, and follow up with the patient and they expressed understanding.  Patient expressed understanding of the importance of proper follow up care.   This document serves as a record of services personally performed by Gardiner Sleeper, MD, PhD. It was created on their behalf by Ernest Mallick, OA, an ophthalmic assistant. The creation of this record is the provider's dictation and/or activities during the visit.    Electronically signed by: Ernest Mallick, OA  11.11.19 8:29 AM    Gardiner Sleeper, M.D., Ph.D. Diseases & Surgery of the Retina and Vitreous [facility]   I have reviewed the above documentation for accuracy and completeness, and I agree with the above. Gardiner Sleeper, M.D., Ph.D. 05/31/18 8:32 AM    Abbreviations: M myopia (nearsighted); A astigmatism; H hyperopia (farsighted); P presbyopia; Mrx spectacle prescription;  CTL contact lenses; OD right eye; OS left eye; OU both eyes  XT exotropia; ET esotropia; PEK punctate epithelial keratitis; PEE punctate epithelial erosions; DES dry eye syndrome; MGD meibomian gland dysfunction; ATs artificial tears; PFAT's preservative free artificial tears; [place] nuclear sclerotic cataract; PSC posterior subcapsular cataract; ERM epi-retinal membrane; PVD posterior vitreous detachment; RD retinal detachment; DM diabetes mellitus; DR diabetic retinopathy; NPDR non-proliferative diabetic retinopathy; PDR proliferative diabetic retinopathy; CSME clinically significant macular edema; DME diabetic macular edema; dbh dot blot hemorrhages; CWS cotton wool spot; POAG primary open angle glaucoma; C/D cup-to-disc ratio; HVF humphrey visual field; GVF goldmann visual field; OCT optical coherence tomography; IOP intraocular pressure; BRVO Branch retinal vein occlusion; CRVO central  retinal vein occlusion; CRAO central retinal artery occlusion; BRAO branch retinal artery occlusion; RT retinal tear; SB scleral buckle; PPV pars plana vitrectomy; VH Vitreous hemorrhage; PRP panretinal laser photocoagulation; IVK intravitreal kenalog; VMT vitreomacular traction; MH Macular hole;  NVD neovascularization of the disc; NVE neovascularization elsewhere; AREDS age related eye disease study; ARMD age related macular degeneration; POAG primary open angle glaucoma; EBMD epithelial/anterior basement membrane dystrophy; ACIOL anterior chamber intraocular lens; IOL intraocular lens; PCIOL posterior chamber intraocular lens; Phaco/IOL phacoemulsification with intraocular lens placement; [place] photorefractive keratectomy; LASIK laser assisted in situ keratomileusis; HTN hypertension; DM diabetes mellitus; COPD chronic obstructive pulmonary disease

## 2018-05-30 ENCOUNTER — Encounter (INDEPENDENT_AMBULATORY_CARE_PROVIDER_SITE_OTHER): Payer: Self-pay | Admitting: Ophthalmology

## 2018-05-30 ENCOUNTER — Ambulatory Visit (INDEPENDENT_AMBULATORY_CARE_PROVIDER_SITE_OTHER): Payer: 59 | Admitting: Ophthalmology

## 2018-05-30 DIAGNOSIS — H35033 Hypertensive retinopathy, bilateral: Secondary | ICD-10-CM | POA: Diagnosis not present

## 2018-05-30 DIAGNOSIS — E113413 Type 2 diabetes mellitus with severe nonproliferative diabetic retinopathy with macular edema, bilateral: Secondary | ICD-10-CM

## 2018-05-30 DIAGNOSIS — I10 Essential (primary) hypertension: Secondary | ICD-10-CM | POA: Diagnosis not present

## 2018-05-30 DIAGNOSIS — H3581 Retinal edema: Secondary | ICD-10-CM

## 2018-05-30 DIAGNOSIS — Z961 Presence of intraocular lens: Secondary | ICD-10-CM

## 2018-05-30 MED ORDER — TRIAMCINOLONE ACETONIDE 40 MG/ML IO SUSP
4.0000 mg | INTRAOCULAR | Status: AC
  Administered 2018-05-30: 4 mg via INTRAVITREAL

## 2018-05-31 ENCOUNTER — Encounter (INDEPENDENT_AMBULATORY_CARE_PROVIDER_SITE_OTHER): Payer: Self-pay | Admitting: Ophthalmology

## 2018-07-10 NOTE — Progress Notes (Signed)
[facility] Note  07/17/2018     CHIEF COMPLAINT Patient presents for Retina Follow Up   HISTORY OF PRESENT ILLNESS: Julia Thomas is a 77 y.o. female who presents to the clinic today for:   HPI    Retina Follow Up    Patient presents with  Diabetic Retinopathy.  In both eyes.  This started months ago.  Severity is severe.  Duration of 7 weeks.  Since onset it is stable.  I, the attending physician,  performed the HPI with the patient and updated documentation appropriately.          Comments    77 y/o female pt here for 7 wk f/u for severe NPDR w/mac edema OU.  No change in [place] OU.  Denies pain, flashes, floaters.  No gtts.  Glucose this a.m. was 133.  A1C unknown.  States she is unable to complete Iluvien form on her own due to poor vision.       Last edited by Bernarda Caffey, MD on 07/19/2018  1:25 PM. (History)    pt states her eyelid is bothering from where the clamp was in last time, she states the injection seemed to help her vision last time, she states she is not seeing as many floaters  Referring physician: No referring provider defined for this encounter.  HISTORICAL INFORMATION:   Selected notes from the MEDICAL RECORD NUMBER Referred by Dr. Dwana Melena for FA LEE: 06.21.19 Matilde Bash) [BCVA: OD: 20/50+2 OS: 20/60+2] Ocular Hx-NPDR with DME OU, pseudo OU, mac scar OS, glaucoma suspect, lamellar mac hole OS PMH-DM (taking Lantus), HTN    CURRENT MEDICATIONS: Current Outpatient Medications (Ophthalmic Drugs)  Medication Sig  . ketorolac (ACULAR) 0.5 % ophthalmic solution   . prednisoLONE acetate (PRED FORTE) 1 % ophthalmic suspension    Current Facility-Administered Medications (Ophthalmic Drugs)  Medication Route  . triamcinolone acetonide (TRIESENCE) 40 MG/ML subtenons injection 4 mg Intravitreal   Current Outpatient Medications (Other)  Medication Sig  . acetaminophen (TYLENOL) 325 MG tablet 650 mg 3 times daily.  [name] Kitchen aspirin EC  81 MG tablet 81 mg every morning.  [name] Kitchen atorvastatin (LIPITOR) 10 MG tablet 10 mg nightly.  . cefTRIAXone (ROCEPHIN) 1 g injection   . fluticasone (FLONASE) 50 MCG/ACT nasal spray 1 spray every morning.  . Insulin Glargine (LANTUS SOLOSTAR) 100 UNIT/ML Solostar Pen 10 Units every morning.  . iron polysaccharides (NIFEREX) 150 MG capsule 150 mg every morning.  . lidocaine (XYLOCAINE) 1 % (with preservative) injection   . metoprolol tartrate (LOPRESSOR) 25 MG tablet 25 mg 2 times daily.  . mupirocin cream (BACTROBAN) 2 % Apply 2 application topically 2 (two) times daily.  . polyethylene glycol (MIRALAX / GLYCOLAX) packet 17 g.  . polyethylene glycol (MIRALAX / GLYCOLAX) packet Take 17 g by mouth as directed.  . Vitamin D, Ergocalciferol, (DRISDOL) 50000 units CAPS capsule 50,000 Units every 30 days.   No current facility-administered medications for this visit.  (Other)      REVIEW OF SYSTEMS: ROS    Positive for: Endocrine, Eyes   Negative for: Constitutional, Gastrointestinal, Neurological, Skin, Genitourinary, Musculoskeletal, HENT, Cardiovascular, Respiratory, Psychiatric, Allergic/Imm, Heme/Lymph   Last edited by Matthew Folks, COA on 07/17/2018 10:17 AM. (History)       ALLERGIES No Known Allergies  PAST MEDICAL HISTORY Past Medical History:  Diagnosis Date  . Anxiety   . Aphasia   . CHF (congestive heart failure) ([place])   . Diabetes ([place])   .  Diabetic retinopathy ([place])    NPDR OU  . Herpes virus disease   . Hyperlipemia   . Hypertension   . Insomnia   . Kidney disease   . Major depression, chronic   . Osteoarthritis   . Pneumonia   . Respiratory failure [facility])    Past Surgical History:  Procedure Laterality Date  . BELOW KNEE LEG AMPUTATION    . CATARACT EXTRACTION    . EYE SURGERY      FAMILY HISTORY History reviewed. No pertinent family history.  SOCIAL HISTORY Social History   Tobacco Use  . Smoking status: Never Smoker  . Smokeless tobacco: Never  Used  Substance Use Topics  . Alcohol use: Never    Frequency: Never  . Drug use: Never         OPHTHALMIC EXAM:  Base Eye Exam    Visual Acuity (Snellen - Linear)      Right Left   Dist [place] 20/80 20/80 +2   Dist ph [place] 20/70 + NI       Tonometry (Tonopen, 10:19 AM)      Right Left   Pressure 15 16       Pupils      Dark Light Shape React APD   Right 3 2 Round Brisk None   Left 3 2 Round Brisk None       Visual Fields (Counting fingers)      Left Right    Full Full       Extraocular Movement      Right Left    Full, Ortho Full, Ortho       Neuro/Psych    Oriented x3:  Yes   Mood/Affect:  Normal       Dilation    Both eyes:  1.0% Mydriacyl, 2.5% Phenylephrine @ 10:19 AM        Slit Lamp and Fundus Exam    Slit Lamp Exam      Right Left   Lids/Lashes Dermatochalasis - upper lid Dermatochalasis - upper lid, periorbital edema (UL>LL)   Conjunctiva/Sclera White and quiet Mild Melanosis   Cornea Arcus, Temporal Well healed cataract wounds Arcus, 1+ Punctate epithelial erosions   Anterior Chamber Deep and quiet Deep and quiet   Iris Round and dilated Round and dilated   Lens PC IOL in good position PC IOL in good position   Vitreous Vitreous syneresis, Posterior vitreous detachment Vitreous syneresis, Posterior vitreous detachment       Fundus Exam      Right Left   Disc 360 peripapillary pigmentation, no NVD 360 PPP   C/D Ratio 0.6 0.6   Macula flat, Blunted foveal reflex, Microaneurysms, scattered cystic changes Flat, Blunted foveal reflex, trace Epiretinal membrane, cystic changes, pigmented laser scars, scattered Microaneurysms   Vessels Vascular attenuation, Tortuous Severe Vascular attenuation, peripherally scerotic, focal area of fibrosis along IT arcade   Periphery Attached, scattered MA; good 360 PRP  Attached; minimal IRH; good PRP scars 360          IMAGING AND PROCEDURES  Imaging and Procedures for @TODAY @  OCT, Retina - OU - Both Eyes        Right Eye Quality was good. Progression has worsened. Findings include abnormal foveal contour, no SRF, intraretinal fluid, outer retinal atrophy (Interval increase in IRF).   Left Eye Quality was good. Central Foveal Thickness: 263. Progression has improved. Findings include intraretinal fluid, abnormal foveal contour, no SRF, outer retinal atrophy (Scattered ORA, interval improvement in IRF).  Notes *Images captured and stored on drive  Diagnosis / Impression:  +DME OU OD w/ Interval increase in IRF OS w/ interval improvement in IRF  Clinical management:  See below  Abbreviations: NFP - Normal foveal profile. CME - cystoid macular edema. PED - pigment epithelial detachment. IRF - intraretinal fluid. SRF - subretinal fluid. EZ - ellipsoid zone. ERM - epiretinal membrane. ORA - outer retinal atrophy. ORT - outer retinal tubulation. SRHM - subretinal hyper-reflective material                  ASSESSMENT/PLAN:    ICD-10-CM   1. Severe nonproliferative diabetic retinopathy of both eyes with macular edema associated with type 2 diabetes mellitus ([place]) Q68.3419   2. Retinal edema H35.81 OCT, Retina - OU - Both Eyes  3. Essential hypertension I10   4. Hypertensive retinopathy of both eyes H35.033   5. Pseudophakia of both eyes Z96.1     1,2. Severe Non-proliferative diabetic retinopathy w/ DME, OU - pt of Dr. Manuella Ghazi -- referred for FA and PRP laser OU - exam shows scattered [place] - FA shows scattered MA and capillary nonperfusion OU -- no NV - OCT shows persistent DME OU - DME was being managed previously by Dr. Manuella Ghazi -- possible Iluvien due to pt's hesitance with possibility of repeat injections - Pt saw Manuella Ghazi on 10.8.19 -- discussed fully switching care over to [facility] - Speaking with [employer]uv[employer] rep, Pilar Plate re: benefits investigation - S/P PRP OS (08.20.19), fill-in (09.03.19) - S/P PRP OD (09.17.19), fill-in (10.14.19) - S/P intravitreal Triesence OS (11.12.19) for  steroid challenge - BCVA remains 20/80 - OCT shows interval improvement in DME OS - IOP remains okay at 16 -- no steroid response - will continue process for Illuvien approval OS, pt filling out benefits investigation today, 12.30.19 - F/U 6 weeks for DFE/OCT  3,4. Hypertensive retinopathy OU - discussed importance of tight BP control - monitor  5. Pseudophakia OU  - s/p CE/IOL by expert surgeon Dr. Susa Simmonds   - beautiful surgeries  - monitor   Ophthalmic Meds Ordered this visit:  No orders of the defined types were placed in this encounter.      Return in about 6 weeks (around 08/28/2018) for F/U NPDR OU, DFE, OCT.  There are no Patient Instructions on file for this visit.   Explained the diagnoses, plan, and follow up with the patient and they expressed understanding.  Patient expressed understanding of the importance of proper follow up care.   This document serves as a record of services personally performed by Gardiner Sleeper, MD, PhD. It was created on their behalf by Ernest Mallick, OA, an ophthalmic assistant. The creation of this record is the provider's dictation and/or activities during the visit.    Electronically signed by: Ernest Mallick, OA  12.23.19 1:27 PM    Gardiner Sleeper, M.D., Ph.D. Diseases & Surgery of the Retina and Vitreous [facility]  I have reviewed the above documentation for accuracy and completeness, and I agree with the above. Gardiner Sleeper, M.D., Ph.D. 07/19/18 1:27 PM   Abbreviations: M myopia (nearsighted); A astigmatism; H hyperopia (farsighted); P presbyopia; Mrx spectacle prescription;  CTL contact lenses; OD right eye; OS left eye; OU both eyes  XT exotropia; ET esotropia; PEK punctate epithelial keratitis; PEE punctate epithelial erosions; DES dry eye syndrome; MGD meibomian gland dysfunction; ATs artificial tears; PFAT's preservative free artificial tears; [place] nuclear sclerotic cataract; PSC posterior  subcapsular  cataract; ERM epi-retinal membrane; PVD posterior vitreous detachment; RD retinal detachment; DM diabetes mellitus; DR diabetic retinopathy; NPDR non-proliferative diabetic retinopathy; PDR proliferative diabetic retinopathy; CSME clinically significant macular edema; DME diabetic macular edema; dbh dot blot hemorrhages; CWS cotton wool spot; POAG primary open angle glaucoma; C/D cup-to-disc ratio; HVF humphrey visual field; GVF goldmann visual field; OCT optical coherence tomography; IOP intraocular pressure; BRVO Branch retinal vein occlusion; CRVO central retinal vein occlusion; CRAO central retinal artery occlusion; BRAO branch retinal artery occlusion; RT retinal tear; SB scleral buckle; PPV pars plana vitrectomy; VH Vitreous hemorrhage; PRP panretinal laser photocoagulation; IVK intravitreal kenalog; VMT vitreomacular traction; MH Macular hole;  NVD neovascularization of the disc; NVE neovascularization elsewhere; AREDS age related eye disease study; ARMD age related macular degeneration; POAG primary open angle glaucoma; EBMD epithelial/anterior basement membrane dystrophy; ACIOL anterior chamber intraocular lens; IOL intraocular lens; PCIOL posterior chamber intraocular lens; Phaco/IOL phacoemulsification with intraocular lens placement; [place] photorefractive keratectomy; LASIK laser assisted in situ keratomileusis; HTN hypertension; DM diabetes mellitus; COPD chronic obstructive pulmonary disease

## 2018-07-17 ENCOUNTER — Ambulatory Visit (INDEPENDENT_AMBULATORY_CARE_PROVIDER_SITE_OTHER): Payer: 59 | Admitting: Ophthalmology

## 2018-07-17 ENCOUNTER — Encounter (INDEPENDENT_AMBULATORY_CARE_PROVIDER_SITE_OTHER): Payer: Self-pay | Admitting: Ophthalmology

## 2018-07-17 DIAGNOSIS — E113413 Type 2 diabetes mellitus with severe nonproliferative diabetic retinopathy with macular edema, bilateral: Secondary | ICD-10-CM

## 2018-07-17 DIAGNOSIS — H3581 Retinal edema: Secondary | ICD-10-CM | POA: Diagnosis not present

## 2018-07-17 DIAGNOSIS — I10 Essential (primary) hypertension: Secondary | ICD-10-CM | POA: Diagnosis not present

## 2018-07-17 DIAGNOSIS — H35033 Hypertensive retinopathy, bilateral: Secondary | ICD-10-CM | POA: Diagnosis not present

## 2018-07-17 DIAGNOSIS — Z961 Presence of intraocular lens: Secondary | ICD-10-CM

## 2018-07-19 ENCOUNTER — Encounter (INDEPENDENT_AMBULATORY_CARE_PROVIDER_SITE_OTHER): Payer: Self-pay | Admitting: Ophthalmology

## 2018-08-28 ENCOUNTER — Encounter (INDEPENDENT_AMBULATORY_CARE_PROVIDER_SITE_OTHER): Payer: 59 | Admitting: Ophthalmology

## 2018-09-12 ENCOUNTER — Encounter (INDEPENDENT_AMBULATORY_CARE_PROVIDER_SITE_OTHER): Payer: 59 | Admitting: Ophthalmology

## 2018-09-21 NOTE — Progress Notes (Signed)
[facility] Note  09/25/2018     CHIEF COMPLAINT Patient presents for Retina Follow Up   HISTORY OF PRESENT ILLNESS: Julia Thomas is a 78 y.o. female who presents to the clinic today for:   HPI    Retina Follow Up    Patient presents with  Diabetic Retinopathy.  In both eyes.  Severity is moderate.  Duration of 10 weeks.  Since onset it is stable.  I, the attending physician,  performed the HPI with the patient and updated documentation appropriately.          Comments    Patient states vision the same OU. Sees "spots" in vision at times OU. BS was 156 this am. Last A1c is unknown.        Last edited by Bernarda Caffey, MD on 09/25/2018  2:25 PM. (History)    pt states she is ready for treatment today, she states she is not on any drops, pt states she is not having any eye pain  Referring physician: No referring provider defined for this encounter.  HISTORICAL INFORMATION:   Selected notes from the MEDICAL RECORD NUMBER Referred by Dr. Dwana Melena for FA LEE: 06.21.19 Matilde Bash) [BCVA: OD: 20/50+2 OS: 20/60+2] Ocular Hx-NPDR with DME OU, pseudo OU, mac scar OS, glaucoma suspect, lamellar mac hole OS PMH-DM (taking Lantus), HTN    CURRENT MEDICATIONS: Current Outpatient Medications (Ophthalmic Drugs)  Medication Sig  . ketorolac (ACULAR) 0.5 % ophthalmic solution   . prednisoLONE acetate (PRED FORTE) 1 % ophthalmic suspension    Current Facility-Administered Medications (Ophthalmic Drugs)  Medication Route  . triamcinolone acetonide (TRIESENCE) 40 MG/ML subtenons injection 4 mg Intravitreal   Current Outpatient Medications (Other)  Medication Sig  . acetaminophen (TYLENOL) 325 MG tablet 650 mg 3 times daily.  [name] Kitchen aspirin EC 81 MG tablet 81 mg every morning.  [name] Kitchen atorvastatin (LIPITOR) 10 MG tablet 10 mg nightly.  . cefTRIAXone (ROCEPHIN) 1 g injection   . fluticasone (FLONASE) 50 MCG/ACT nasal spray 1 spray every morning.  . Insulin Glargine  (LANTUS SOLOSTAR) 100 UNIT/ML Solostar Pen 10 Units every morning.  . iron polysaccharides (NIFEREX) 150 MG capsule 150 mg every morning.  . lidocaine (XYLOCAINE) 1 % (with preservative) injection   . metoprolol tartrate (LOPRESSOR) 25 MG tablet 25 mg 2 times daily.  . mupirocin cream (BACTROBAN) 2 % Apply 2 application topically 2 (two) times daily.  . polyethylene glycol (MIRALAX / GLYCOLAX) packet 17 g.  . polyethylene glycol (MIRALAX / GLYCOLAX) packet Take 17 g by mouth as directed.  . Vitamin D, Ergocalciferol, (DRISDOL) 50000 units CAPS capsule 50,000 Units every 30 days.   No current facility-administered medications for this visit.  (Other)      REVIEW OF SYSTEMS: ROS    Positive for: Endocrine, Eyes   Negative for: Constitutional, Gastrointestinal, Neurological, Skin, Genitourinary, Musculoskeletal, HENT, Cardiovascular, Respiratory, Psychiatric, Allergic/Imm, Heme/Lymph   Last edited by Roselee Nova D on 09/25/2018  2:20 PM. (History)       ALLERGIES No Known Allergies  PAST MEDICAL HISTORY Past Medical History:  Diagnosis Date  . Anxiety   . Aphasia   . CHF (congestive heart failure) ([place])   . Diabetes ([place])   . Diabetic retinopathy ([place])    NPDR OU  . Herpes virus disease   . Hyperlipemia   . Hypertension   . Insomnia   . Kidney disease   . Major depression, chronic   . Osteoarthritis   .  Pneumonia   . Respiratory failure [facility])    Past Surgical History:  Procedure Laterality Date  . BELOW KNEE LEG AMPUTATION    . CATARACT EXTRACTION    . EYE SURGERY      FAMILY HISTORY History reviewed. No pertinent family history.  SOCIAL HISTORY Social History   Tobacco Use  . Smoking status: Never Smoker  . Smokeless tobacco: Never Used  Substance Use Topics  . Alcohol use: Never    Frequency: Never  . Drug use: Never         OPHTHALMIC EXAM:  Base Eye Exam    Visual Acuity (Snellen - Linear)      Right Left   Dist cc 20/70 -2 20/70 -2   Dist  ph cc 20/60 -2 NI       Tonometry (Tonopen, 2:30 PM)      Right Left   Pressure 14 22       Tonometry #2 (Tonopen, 3:46 PM)      Right Left   Pressure  22       Tonometry #3 (Tonopen, 3:47 PM)      Right Left   Pressure  19       Pupils      Dark Light Shape React APD   Right 3 2 Round Brisk None   Left 3 2 Round Brisk None       Visual Fields (Counting fingers)      Left Right    Full Full       Extraocular Movement      Right Left    Full, Ortho Full, Ortho       Neuro/Psych    Oriented x3:  Yes   Mood/Affect:  Normal       Dilation    Both eyes:  1.0% Mydriacyl, 2.5% Phenylephrine @ 2:30 PM        Slit Lamp and Fundus Exam    Slit Lamp Exam      Right Left   Lids/Lashes Dermatochalasis - upper lid Dermatochalasis - upper lid, periorbital edema (UL>LL)   Conjunctiva/Sclera White and quiet Mild Melanosis   Cornea Arcus, Temporal Well healed cataract wounds Arcus, 1+ Punctate epithelial erosions   Anterior Chamber Deep and quiet Deep and quiet   Iris Round and dilated Round and dilated   Lens PC IOL in good position PC IOL in good position   Vitreous Vitreous syneresis, Posterior vitreous detachment Vitreous syneresis, Posterior vitreous detachment       Fundus Exam      Right Left   Disc 360 peripapillary pigmentation, no NVD 360 PPP, sharp   C/D Ratio 0.6 0.6   Macula flat, Blunted foveal reflex, Microaneurysms, scattered cystic changes Flat, Blunted foveal reflex, trace Epiretinal membrane, cystic changes, pigmented laser scars, scattered Microaneurysms, mild fibrosis along IT arcades   Vessels Vascular attenuation, Tortuous Severe Vascular attenuation, peripherally scerotic, focal area of fibrosis along IT arcade   Periphery Attached, scattered MA; good 360 PRP  Attached; minimal IRH; good PRP scars 360        Refraction    Wearing Rx      Sphere Add   Right +0.25sph +2.75   Left -0.50 sph +2.75          IMAGING AND PROCEDURES  Imaging  and Procedures for _0 @           ASSESSMENT/PLAN:    ICD-10-CM   1. Severe nonproliferative diabetic retinopathy of both eyes with macular edema associated  with type 2 diabetes mellitus ([place]) B84.6659   2. Retinal edema H35.81   3. Essential hypertension I10   4. Hypertensive retinopathy of both eyes H35.033   5. Pseudophakia of both eyes Z96.1     1,2. Severe Non-proliferative diabetic retinopathy w/ DME, OU - former pt of Dr. Manuella Ghazi -- initially referred for FA and PRP laser OU - exam shows scattered [place] - FA shows scattered MA and capillary nonperfusion OU -- no NV - OCT shows persistent DME OU - DME was being managed previously by Dr. Manuella Ghazi -- possible Iluvien due to pt's hesitance with possibility of repeat injections - Pt saw Manuella Ghazi on 10.8.19 -- discussed fully switching care over to [facility] - Speaking with [employer] rep, Pilar Plate re: benefits investigation -- pt approved - S/P PRP OS (08.20.19), fill-in (09.03.19) - S/P PRP OD (09.17.19), fill-in (10.14.19) - S/P intravitreal Triesence OS (11.12.19) for steroid challenge - BCVA slightly improved to 20/70-2 from 20/80 today - OCT unable to be obtained today due to pt immobility - IOP 19 - recommend Illuvien implant OS today, 03.09.20 for chronic DME and pt's difficulty with repeat injections - RBA of procedure discussed, questions answered - informed consent obtained and signed - see procedure note - start brimonidine BID OS - start zymaxid QID OS x7 days - F/U 4 weeks for DFE/OCT  3,4. Hypertensive retinopathy OU - discussed importance of tight BP control - monitor  5. Pseudophakia OU  - s/p CE/IOL by expert surgeon Dr. Susa Simmonds   - beautiful surgeries  - monitor   Ophthalmic Meds Ordered this visit:  No orders of the defined types were placed in this encounter.      Return in about 4 weeks (around 10/23/2018) for f/u NPDR OU, DFE, OCT.  There are no Patient Instructions on file for this  visit.   Explained the diagnoses, plan, and follow up with the patient and they expressed understanding.  Patient expressed understanding of the importance of proper follow up care.   This document serves as a record of services personally performed by Gardiner Sleeper, MD, PhD. It was created on their behalf by Ernest Mallick, OA, an ophthalmic assistant. The creation of this record is the provider's dictation and/or activities during the visit.    Electronically signed by: Ernest Mallick, OA 03.05.2020 3:49 PM     Gardiner Sleeper, M.D., Ph.D. Diseases & Surgery of the Retina and Vitreous [facility] [place] [facility]  I have reviewed the above documentation for accuracy and completeness, and I agree with the above. Gardiner Sleeper, M.D., Ph.D. 09/27/18 3:55 PM    Abbreviations: M myopia (nearsighted); A astigmatism; H hyperopia (farsighted); P presbyopia; Mrx spectacle prescription;  CTL contact lenses; OD right eye; OS left eye; OU both eyes  XT exotropia; ET esotropia; PEK punctate epithelial keratitis; PEE punctate epithelial erosions; DES dry eye syndrome; MGD meibomian gland dysfunction; ATs artificial tears; PFAT's preservative free artificial tears; [place] nuclear sclerotic cataract; PSC posterior subcapsular cataract; ERM epi-retinal membrane; PVD posterior vitreous detachment; RD retinal detachment; DM diabetes mellitus; DR diabetic retinopathy; NPDR non-proliferative diabetic retinopathy; PDR proliferative diabetic retinopathy; CSME clinically significant macular edema; DME diabetic macular edema; dbh dot blot hemorrhages; CWS cotton wool spot; POAG primary open angle glaucoma; C/D cup-to-disc ratio; HVF humphrey visual field; GVF goldmann visual field; OCT optical coherence tomography; IOP intraocular pressure; BRVO Branch retinal vein occlusion; CRVO central retinal vein occlusion; CRAO central retinal artery occlusion; BRAO branch retinal artery occlusion; RT  retinal tear; SB scleral  buckle; PPV pars plana vitrectomy; VH Vitreous hemorrhage; PRP panretinal laser photocoagulation; IVK intravitreal kenalog; VMT vitreomacular traction; MH Macular hole;  NVD neovascularization of the disc; NVE neovascularization elsewhere; AREDS age related eye disease study; ARMD age related macular degeneration; POAG primary open angle glaucoma; EBMD epithelial/anterior basement membrane dystrophy; ACIOL anterior chamber intraocular lens; IOL intraocular lens; PCIOL posterior chamber intraocular lens; Phaco/IOL phacoemulsification with intraocular lens placement; [place] photorefractive keratectomy; LASIK laser assisted in situ keratomileusis; HTN hypertension; DM diabetes mellitus; COPD chronic obstructive pulmonary disease

## 2018-09-25 ENCOUNTER — Encounter (INDEPENDENT_AMBULATORY_CARE_PROVIDER_SITE_OTHER): Payer: Self-pay | Admitting: Ophthalmology

## 2018-09-25 ENCOUNTER — Ambulatory Visit (INDEPENDENT_AMBULATORY_CARE_PROVIDER_SITE_OTHER): Payer: 59 | Admitting: Ophthalmology

## 2018-09-25 DIAGNOSIS — H3581 Retinal edema: Secondary | ICD-10-CM

## 2018-09-25 DIAGNOSIS — Z961 Presence of intraocular lens: Secondary | ICD-10-CM

## 2018-09-25 DIAGNOSIS — E113413 Type 2 diabetes mellitus with severe nonproliferative diabetic retinopathy with macular edema, bilateral: Secondary | ICD-10-CM

## 2018-09-25 DIAGNOSIS — H35033 Hypertensive retinopathy, bilateral: Secondary | ICD-10-CM

## 2018-09-25 DIAGNOSIS — I10 Essential (primary) hypertension: Secondary | ICD-10-CM

## 2018-09-27 ENCOUNTER — Encounter (INDEPENDENT_AMBULATORY_CARE_PROVIDER_SITE_OTHER): Payer: Self-pay | Admitting: Ophthalmology

## 2018-10-10 MED ORDER — FLUOCINOLONE ACETONIDE 0.19 MG IZ IMPL
0.1900 mg | DRUG_IMPLANT | INTRAVITREAL | Status: AC | PRN
  Administered 2018-10-10: .19 mg via INTRAVITREAL

## 2018-10-24 ENCOUNTER — Encounter (INDEPENDENT_AMBULATORY_CARE_PROVIDER_SITE_OTHER): Payer: 59 | Admitting: Ophthalmology

## 2018-11-27 ENCOUNTER — Encounter: Payer: Self-pay | Admitting: Internal Medicine

## 2018-11-30 ENCOUNTER — Encounter (HOSPITAL_COMMUNITY): Payer: Self-pay

## 2018-11-30 ENCOUNTER — Emergency Department (HOSPITAL_COMMUNITY): Payer: 59

## 2018-11-30 ENCOUNTER — Inpatient Hospital Stay (HOSPITAL_COMMUNITY): Payer: 59

## 2018-11-30 ENCOUNTER — Inpatient Hospital Stay (HOSPITAL_COMMUNITY)
Admission: EM | Admit: 2018-11-30 | Discharge: 2018-12-18 | DRG: 870 | Disposition: E | Payer: 59 | Attending: Internal Medicine | Admitting: Internal Medicine

## 2018-11-30 ENCOUNTER — Other Ambulatory Visit: Payer: Self-pay

## 2018-11-30 DIAGNOSIS — A4189 Other specified sepsis: Secondary | ICD-10-CM | POA: Diagnosis present

## 2018-11-30 DIAGNOSIS — Y95 Nosocomial condition: Secondary | ICD-10-CM | POA: Diagnosis present

## 2018-11-30 DIAGNOSIS — E113293 Type 2 diabetes mellitus with mild nonproliferative diabetic retinopathy without macular edema, bilateral: Secondary | ICD-10-CM | POA: Diagnosis present

## 2018-11-30 DIAGNOSIS — L8990 Pressure ulcer of unspecified site, unspecified stage: Secondary | ICD-10-CM | POA: Diagnosis present

## 2018-11-30 DIAGNOSIS — D62 Acute posthemorrhagic anemia: Secondary | ICD-10-CM | POA: Diagnosis present

## 2018-11-30 DIAGNOSIS — E1122 Type 2 diabetes mellitus with diabetic chronic kidney disease: Secondary | ICD-10-CM | POA: Diagnosis present

## 2018-11-30 DIAGNOSIS — D696 Thrombocytopenia, unspecified: Secondary | ICD-10-CM | POA: Diagnosis present

## 2018-11-30 DIAGNOSIS — E1165 Type 2 diabetes mellitus with hyperglycemia: Secondary | ICD-10-CM | POA: Diagnosis present

## 2018-11-30 DIAGNOSIS — E875 Hyperkalemia: Secondary | ICD-10-CM

## 2018-11-30 DIAGNOSIS — I130 Hypertensive heart and chronic kidney disease with heart failure and stage 1 through stage 4 chronic kidney disease, or unspecified chronic kidney disease: Secondary | ICD-10-CM | POA: Diagnosis present

## 2018-11-30 DIAGNOSIS — G9340 Encephalopathy, unspecified: Secondary | ICD-10-CM | POA: Diagnosis not present

## 2018-11-30 DIAGNOSIS — R4701 Aphasia: Secondary | ICD-10-CM | POA: Diagnosis present

## 2018-11-30 DIAGNOSIS — K219 Gastro-esophageal reflux disease without esophagitis: Secondary | ICD-10-CM | POA: Diagnosis present

## 2018-11-30 DIAGNOSIS — U071 COVID-19: Secondary | ICD-10-CM

## 2018-11-30 DIAGNOSIS — J1289 Other viral pneumonia: Secondary | ICD-10-CM | POA: Diagnosis present

## 2018-11-30 DIAGNOSIS — Z9911 Dependence on respirator [ventilator] status: Secondary | ICD-10-CM | POA: Diagnosis not present

## 2018-11-30 DIAGNOSIS — R509 Fever, unspecified: Secondary | ICD-10-CM | POA: Diagnosis not present

## 2018-11-30 DIAGNOSIS — R6521 Severe sepsis with septic shock: Secondary | ICD-10-CM | POA: Diagnosis not present

## 2018-11-30 DIAGNOSIS — N170 Acute kidney failure with tubular necrosis: Secondary | ICD-10-CM | POA: Diagnosis not present

## 2018-11-30 DIAGNOSIS — J9621 Acute and chronic respiratory failure with hypoxia: Secondary | ICD-10-CM | POA: Diagnosis not present

## 2018-11-30 DIAGNOSIS — I10 Essential (primary) hypertension: Secondary | ICD-10-CM | POA: Diagnosis not present

## 2018-11-30 DIAGNOSIS — E870 Hyperosmolality and hypernatremia: Secondary | ICD-10-CM | POA: Diagnosis present

## 2018-11-30 DIAGNOSIS — R109 Unspecified abdominal pain: Secondary | ICD-10-CM

## 2018-11-30 DIAGNOSIS — A419 Sepsis, unspecified organism: Secondary | ICD-10-CM | POA: Diagnosis not present

## 2018-11-30 DIAGNOSIS — R680 Hypothermia, not associated with low environmental temperature: Secondary | ICD-10-CM | POA: Diagnosis present

## 2018-11-30 DIAGNOSIS — R0602 Shortness of breath: Secondary | ICD-10-CM | POA: Diagnosis present

## 2018-11-30 DIAGNOSIS — N179 Acute kidney failure, unspecified: Secondary | ICD-10-CM | POA: Diagnosis not present

## 2018-11-30 DIAGNOSIS — Z9289 Personal history of other medical treatment: Secondary | ICD-10-CM

## 2018-11-30 DIAGNOSIS — Z794 Long term (current) use of insulin: Secondary | ICD-10-CM

## 2018-11-30 DIAGNOSIS — I509 Heart failure, unspecified: Secondary | ICD-10-CM | POA: Diagnosis present

## 2018-11-30 DIAGNOSIS — Z89511 Acquired absence of right leg below knee: Secondary | ICD-10-CM

## 2018-11-30 DIAGNOSIS — R571 Hypovolemic shock: Secondary | ICD-10-CM | POA: Diagnosis not present

## 2018-11-30 DIAGNOSIS — J9601 Acute respiratory failure with hypoxia: Secondary | ICD-10-CM | POA: Diagnosis not present

## 2018-11-30 DIAGNOSIS — Z09 Encounter for follow-up examination after completed treatment for conditions other than malignant neoplasm: Secondary | ICD-10-CM

## 2018-11-30 DIAGNOSIS — E785 Hyperlipidemia, unspecified: Secondary | ICD-10-CM | POA: Diagnosis present

## 2018-11-30 DIAGNOSIS — E872 Acidosis: Secondary | ICD-10-CM | POA: Diagnosis not present

## 2018-11-30 DIAGNOSIS — Z7982 Long term (current) use of aspirin: Secondary | ICD-10-CM

## 2018-11-30 DIAGNOSIS — R6520 Severe sepsis without septic shock: Secondary | ICD-10-CM | POA: Diagnosis not present

## 2018-11-30 DIAGNOSIS — F0390 Unspecified dementia without behavioral disturbance: Secondary | ICD-10-CM | POA: Diagnosis present

## 2018-11-30 DIAGNOSIS — J189 Pneumonia, unspecified organism: Secondary | ICD-10-CM | POA: Diagnosis not present

## 2018-11-30 DIAGNOSIS — J80 Acute respiratory distress syndrome: Secondary | ICD-10-CM | POA: Diagnosis present

## 2018-11-30 DIAGNOSIS — Z978 Presence of other specified devices: Secondary | ICD-10-CM

## 2018-11-30 DIAGNOSIS — Z515 Encounter for palliative care: Secondary | ICD-10-CM | POA: Diagnosis present

## 2018-11-30 DIAGNOSIS — Z79899 Other long term (current) drug therapy: Secondary | ICD-10-CM

## 2018-11-30 DIAGNOSIS — Z452 Encounter for adjustment and management of vascular access device: Secondary | ICD-10-CM

## 2018-11-30 DIAGNOSIS — J9690 Respiratory failure, unspecified, unspecified whether with hypoxia or hypercapnia: Secondary | ICD-10-CM

## 2018-11-30 DIAGNOSIS — J1282 Pneumonia due to coronavirus disease 2019: Secondary | ICD-10-CM

## 2018-11-30 DIAGNOSIS — R140 Abdominal distension (gaseous): Secondary | ICD-10-CM

## 2018-11-30 DIAGNOSIS — Z7951 Long term (current) use of inhaled steroids: Secondary | ICD-10-CM

## 2018-11-30 DIAGNOSIS — R34 Anuria and oliguria: Secondary | ICD-10-CM | POA: Diagnosis present

## 2018-11-30 DIAGNOSIS — J181 Lobar pneumonia, unspecified organism: Secondary | ICD-10-CM | POA: Diagnosis not present

## 2018-11-30 DIAGNOSIS — N183 Chronic kidney disease, stage 3 (moderate): Secondary | ICD-10-CM | POA: Diagnosis present

## 2018-11-30 DIAGNOSIS — M1990 Unspecified osteoarthritis, unspecified site: Secondary | ICD-10-CM | POA: Diagnosis present

## 2018-11-30 LAB — POCT I-STAT EG7
Acid-base deficit: 11 mmol/L — ABNORMAL HIGH (ref 0.0–2.0)
Bicarbonate: 15.3 mmol/L — ABNORMAL LOW (ref 20.0–28.0)
Calcium, Ion: 1.08 mmol/L — ABNORMAL LOW (ref 1.15–1.40)
HCT: 24 % — ABNORMAL LOW (ref 36.0–46.0)
Hemoglobin: 8.2 g/dL — ABNORMAL LOW (ref 12.0–15.0)
O2 Saturation: 65 %
Potassium: 5.4 mmol/L — ABNORMAL HIGH (ref 3.5–5.1)
Sodium: 144 mmol/L (ref 135–145)
TCO2: 16 mmol/L — ABNORMAL LOW (ref 22–32)
pCO2, Ven: 32.7 mmHg — ABNORMAL LOW (ref 44.0–60.0)
pH, Ven: 7.277 (ref 7.250–7.430)
pO2, Ven: 37 mmHg (ref 32.0–45.0)

## 2018-11-30 LAB — COMPREHENSIVE METABOLIC PANEL
ALT: 18 U/L (ref 0–44)
AST: 35 U/L (ref 15–41)
Albumin: 2.7 g/dL — ABNORMAL LOW (ref 3.5–5.0)
Alkaline Phosphatase: 183 U/L — ABNORMAL HIGH (ref 38–126)
Anion gap: 12 (ref 5–15)
BUN: 72 mg/dL — ABNORMAL HIGH (ref 8–23)
CO2: 13 mmol/L — ABNORMAL LOW (ref 22–32)
Calcium: 7.9 mg/dL — ABNORMAL LOW (ref 8.9–10.3)
Chloride: 117 mmol/L — ABNORMAL HIGH (ref 98–111)
Creatinine, Ser: 5.9 mg/dL — ABNORMAL HIGH (ref 0.44–1.00)
GFR calc Af Amer: 7 mL/min — ABNORMAL LOW (ref >60)
GFR calc non Af Amer: 6 mL/min — ABNORMAL LOW (ref >60)
Glucose, Bld: 128 mg/dL — ABNORMAL HIGH (ref 70–99)
Potassium: 5.6 mmol/L — ABNORMAL HIGH (ref 3.5–5.1)
Sodium: 142 mmol/L (ref 135–145)
Total Bilirubin: 0.4 mg/dL (ref 0.3–1.2)
Total Protein: 6.5 g/dL (ref 6.5–8.1)

## 2018-11-30 LAB — CBC WITH DIFFERENTIAL/PLATELET
Abs Immature Granulocytes: 0.11 10*3/uL — ABNORMAL HIGH (ref 0.00–0.07)
Basophils Absolute: 0 10*3/uL (ref 0.0–0.1)
Basophils Relative: 0 %
Eosinophils Absolute: 0 10*3/uL (ref 0.0–0.5)
Eosinophils Relative: 0 %
HCT: 26.7 % — ABNORMAL LOW (ref 36.0–46.0)
Hemoglobin: 8.1 g/dL — ABNORMAL LOW (ref 12.0–15.0)
Immature Granulocytes: 1 %
Lymphocytes Relative: 16 %
Lymphs Abs: 1.4 10*3/uL (ref 0.7–4.0)
MCH: 28.7 pg (ref 26.0–34.0)
MCHC: 30.3 g/dL (ref 30.0–36.0)
MCV: 94.7 fL (ref 80.0–100.0)
Monocytes Absolute: 0.4 10*3/uL (ref 0.1–1.0)
Monocytes Relative: 4 %
Neutro Abs: 6.8 10*3/uL (ref 1.7–7.7)
Neutrophils Relative %: 79 %
Platelets: 185 10*3/uL (ref 150–400)
RBC: 2.82 MIL/uL — ABNORMAL LOW (ref 3.87–5.11)
RDW: 12.8 % (ref 11.5–15.5)
WBC: 8.7 10*3/uL (ref 4.0–10.5)
nRBC: 0 % (ref 0.0–0.2)

## 2018-11-30 LAB — URINALYSIS, COMPLETE (UACMP) WITH MICROSCOPIC
Bilirubin Urine: NEGATIVE
Glucose, UA: NEGATIVE mg/dL
Ketones, ur: NEGATIVE mg/dL
Leukocytes,Ua: NEGATIVE
Nitrite: NEGATIVE
Protein, ur: >=300 mg/dL — AB
Specific Gravity, Urine: 1.014 (ref 1.005–1.030)
pH: 5 (ref 5.0–8.0)

## 2018-11-30 LAB — BASIC METABOLIC PANEL WITH GFR
Anion gap: 10 (ref 5–15)
BUN: 73 mg/dL — ABNORMAL HIGH (ref 8–23)
CO2: 16 mmol/L — ABNORMAL LOW (ref 22–32)
Calcium: 7.5 mg/dL — ABNORMAL LOW (ref 8.9–10.3)
Chloride: 119 mmol/L — ABNORMAL HIGH (ref 98–111)
Creatinine, Ser: 5.9 mg/dL — ABNORMAL HIGH (ref 0.44–1.00)
GFR calc Af Amer: 7 mL/min — ABNORMAL LOW
GFR calc non Af Amer: 6 mL/min — ABNORMAL LOW
Glucose, Bld: 111 mg/dL — ABNORMAL HIGH (ref 70–99)
Potassium: 5.4 mmol/L — ABNORMAL HIGH (ref 3.5–5.1)
Sodium: 145 mmol/L (ref 135–145)

## 2018-11-30 LAB — FIBRINOGEN: Fibrinogen: 591 mg/dL — ABNORMAL HIGH (ref 210–475)

## 2018-11-30 LAB — D-DIMER, QUANTITATIVE: D-Dimer, Quant: 1.78 ug/mL-FEU — ABNORMAL HIGH (ref 0.00–0.50)

## 2018-11-30 LAB — FERRITIN: Ferritin: 501 ng/mL — ABNORMAL HIGH (ref 11–307)

## 2018-11-30 LAB — ABO/RH: ABO/RH(D): O POS

## 2018-11-30 LAB — LACTIC ACID, PLASMA
Lactic Acid, Venous: 1.3 mmol/L (ref 0.5–1.9)
Lactic Acid, Venous: 1.1 mmol/L (ref 0.5–1.9)

## 2018-11-30 LAB — PROCALCITONIN
Procalcitonin: 6.78 ng/mL
Procalcitonin: 7.19 ng/mL

## 2018-11-30 LAB — PHOSPHORUS: Phosphorus: 4.7 mg/dL — ABNORMAL HIGH (ref 2.5–4.6)

## 2018-11-30 LAB — TROPONIN I: Troponin I: 0.11 ng/mL (ref <0.03)

## 2018-11-30 LAB — CBC
HCT: 24.8 % — ABNORMAL LOW (ref 36.0–46.0)
Hemoglobin: 7.5 g/dL — ABNORMAL LOW (ref 12.0–15.0)
MCH: 28.3 pg (ref 26.0–34.0)
MCHC: 30.2 g/dL (ref 30.0–36.0)
MCV: 93.6 fL (ref 80.0–100.0)
Platelets: 161 10*3/uL (ref 150–400)
RBC: 2.65 MIL/uL — ABNORMAL LOW (ref 3.87–5.11)
RDW: 12.8 % (ref 11.5–15.5)
WBC: 7.3 10*3/uL (ref 4.0–10.5)
nRBC: 0 % (ref 0.0–0.2)

## 2018-11-30 LAB — LACTATE DEHYDROGENASE: LDH: 328 U/L — ABNORMAL HIGH (ref 98–192)

## 2018-11-30 LAB — GLUCOSE, CAPILLARY: Glucose-Capillary: 102 mg/dL — ABNORMAL HIGH (ref 70–99)

## 2018-11-30 LAB — TRIGLYCERIDES: Triglycerides: 119 mg/dL (ref <150)

## 2018-11-30 LAB — MAGNESIUM: Magnesium: 2.1 mg/dL (ref 1.7–2.4)

## 2018-11-30 LAB — I-STAT CREATININE, ED: Creatinine, Ser: 5.9 mg/dL — ABNORMAL HIGH (ref 0.44–1.00)

## 2018-11-30 LAB — C-REACTIVE PROTEIN: CRP: 15.8 mg/dL — ABNORMAL HIGH (ref <1.0)

## 2018-11-30 MED ORDER — SODIUM CHLORIDE 0.9 % IV SOLN
INTRAVENOUS | Status: DC
  Administered 2018-11-30: 21:00:00 via INTRAVENOUS

## 2018-11-30 MED ORDER — VANCOMYCIN HCL 10 G IV SOLR
1750.0000 mg | Freq: Once | INTRAVENOUS | Status: AC
  Administered 2018-11-30: 18:00:00 1750 mg via INTRAVENOUS
  Filled 2018-11-30: qty 1750

## 2018-11-30 MED ORDER — SODIUM ZIRCONIUM CYCLOSILICATE 10 G PO PACK
10.0000 g | PACK | Freq: Once | ORAL | Status: AC
  Administered 2018-11-30: 10 g via ORAL
  Filled 2018-11-30: qty 1

## 2018-11-30 MED ORDER — METOPROLOL TARTRATE 12.5 MG HALF TABLET
25.0000 mg | ORAL_TABLET | Freq: Two times a day (BID) | ORAL | Status: DC
  Administered 2018-11-30 – 2018-12-02 (×4): 25 mg via ORAL
  Filled 2018-11-30 (×4): qty 1
  Filled 2018-11-30: qty 2

## 2018-11-30 MED ORDER — INSULIN ASPART 100 UNIT/ML ~~LOC~~ SOLN: 0.0000 [IU] | Freq: Every day | SUBCUTANEOUS | Status: DC

## 2018-11-30 MED ORDER — VANCOMYCIN VARIABLE DOSE PER UNSTABLE RENAL FUNCTION (PHARMACIST DOSING): Status: DC

## 2018-11-30 MED ORDER — SODIUM CHLORIDE 0.9 % IV SOLN: 1.0000 g | INTRAVENOUS | Status: DC

## 2018-11-30 MED ORDER — MORPHINE SULFATE (PF) 2 MG/ML IV SOLN
2.0000 mg | Freq: Once | INTRAVENOUS | Status: AC
  Administered 2018-11-30: 2 mg via INTRAVENOUS
  Filled 2018-11-30: qty 1

## 2018-11-30 MED ORDER — SODIUM ZIRCONIUM CYCLOSILICATE 5 G PO PACK
5.0000 g | PACK | Freq: Three times a day (TID) | ORAL | Status: DC
  Administered 2018-11-30 – 2018-12-01 (×3): 5 g via ORAL
  Filled 2018-11-30 (×4): qty 1

## 2018-11-30 MED ORDER — INSULIN ASPART 100 UNIT/ML ~~LOC~~ SOLN: 0.0000 [IU] | Freq: Three times a day (TID) | SUBCUTANEOUS | Status: DC

## 2018-11-30 MED ORDER — SODIUM CHLORIDE 0.9 % IV BOLUS
1000.0000 mL | Freq: Once | INTRAVENOUS | Status: AC
  Administered 2018-11-30: 17:00:00 1000 mL via INTRAVENOUS

## 2018-11-30 MED ORDER — SODIUM CHLORIDE 0.9 % IV SOLN
2.0000 g | Freq: Once | INTRAVENOUS | Status: AC
  Administered 2018-11-30: 2 g via INTRAVENOUS
  Filled 2018-11-30: qty 2

## 2018-11-30 MED ORDER — HEPARIN SODIUM (PORCINE) 5000 UNIT/ML IJ SOLN
5000.0000 [IU] | Freq: Three times a day (TID) | INTRAMUSCULAR | Status: DC
  Administered 2018-11-30 – 2018-12-02 (×5): 5000 [IU] via SUBCUTANEOUS
  Filled 2018-11-30 (×5): qty 1

## 2018-11-30 NOTE — Progress Notes (Signed)
Pharmacy Antibiotic Note  Julia Thomas is a 78 y.o. female admitted on Dec 09, 2018 with HCAP/PNA, also COVID +.  Pharmacy has been consulted for cefepime/vancomycin dosing. Afebrile, WBC 8.7, LA 1.1. Scr 5.9 (unknown baseline).  Plan: Vancomycin 1750 mg IV x1. Given renal function, will not administer maintenance dose. Further doses to be determined based on levels/renal recovery.  Cefepime 1 g IV q24h Monitor renal function, clinical status, and length of therapy    Height: 5\' 4"  (162.6 cm) Weight: 200 lb (90.7 kg) IBW/kg (Calculated) : 54.7  Temp (24hrs), Avg:99.6 F (37.6 C), Min:99.6 F (37.6 C), Max:99.6 F (37.6 C)  Recent Labs  Lab 12-09-18 1627 12-09-18 1645 12/09/2018 1653 Dec 09, 2018 1735  WBC  --  8.7  --   --   CREATININE  --  5.90* 5.90*  --   LATICACIDVEN 1.3  --   --  1.1    Estimated Creatinine Clearance: 8.8 mL/min (A) (by C-G formula based on SCr of 5.9 mg/dL (H)).    No Known Allergies  Antimicrobials this admission: Vancomycin 5/14 >> Cefepime 5/14 >>  Dose adjustments this admission:   Microbiology results: 5/14 BCx: 5/14 UCx:  Thank you for allowing pharmacy to be a part of this patient's care.  Marcelino Freestone, PharmD PGY2 Cardiology Pharmacy Resident Please check AMION for all Pharmacist numbers by unit Dec 09, 2018 6:17 PM

## 2018-11-30 NOTE — H&P (Signed)
History and Physical  Julia SalisburyDorothy Thomas WGN:[id number] DOB: 11/10/1940 DOA: 12/09/2018  Referring physician: ER provider PCP: [name], Daniel, MD  Outpatient Specialists:    Patient coming from: Skilled nursing facility  Chief Complaint: Acute kidney injury on chronic kidney disease  HPI: Patient is a 78 year old African-American female, skilled nursing facility resident, with past medical history significant for recently positive test for COVID 19 3 days ago, chronic kidney disease with unknown baseline, diabetes mellitus, hypertension, CHF, dementia amongst other past medical and cardiac history.  Patient is unable to give any significant history.  Over the last several days, patient is said to have developed decreased p.o. intake.  BMP MP done at the skilled nursing facility revealed that the patient had developed acute kidney injury, hence, the decision to transfer patient to the hospital for further assessment and management.  On presentation to the hospital, BUN was noted to be 72, weight serum creatinine of 5.9, potassium of 5.6 and CO2 of 13.  Patient is saturating at 100% on 2 L supplemental oxygen.  Patient be admitted for further assessment and management.  ED Course: Vitals on presentation revealed temperature of 99.6, blood pressure 138/60, heart rate of 79 with respiratory rate of 15 and O2 sat of 100% on 2 L.  Initial work-up is as documented below.  Patient was also pancultured and started on IV antibiotics.  Apparently, patient was on antibiotics prior to presentation.  Patient is also being hydrated in the ER.  Hospitalist team has been called to admit patient for further assessment and management.   Pertinent labs: Chemistry reveals sodium of 142, potassium of 5.6, chloride 117, CO2 of 13, BUN of 70, serum creatinine of 5.9 and blood sugar of 128.  Alkaline phosphatase is 183, albumin of 2.7, AST of 35, ALT of 18, total protein of 6.5.  LDH is 328 with troponin of 0.11.  Ferritin is  501.  CRP is 15.8.  Procalcitonin 6.78.  Lactic acid is 1.3.  CBC reveals WBC of 8.7, hematocrit of 26.7, hemoglobin of 8.1, MCV of 94.7 platelet count of 185.  1.78, fibrinogen 591.  EKG: Independently reviewed.   Imaging: independently reviewed.  Chest x-ray reveals patchy hazy opacity throughout both lung fields, greater on the right side.  Review of Systems: Unobtainable.  Past Medical History:  Diagnosis Date   Anxiety    Aphasia    CHF (congestive heart failure) (HCC)    Diabetes (HCC)    Diabetic retinopathy (HCC)    NPDR OU   Herpes virus disease    Hyperlipemia    Hypertension    Insomnia    Kidney disease    Major depression, chronic    Osteoarthritis    Pneumonia    Respiratory failure (HCC)     Past Surgical History:  Procedure Laterality Date   BELOW KNEE LEG AMPUTATION     CATARACT EXTRACTION     EYE SURGERY       reports that she has never smoked. She has never used smokeless tobacco. She reports that she does not drink alcohol or use drugs.  No Known Allergies  History reviewed. No pertinent family history.   Prior to Admission medications   Medication Sig Start Date End Date Taking? Authorizing Provider  acetaminophen (TYLENOL) 325 MG tablet 650 mg 3 times daily.    [provider]  aspirin EC 81 MG tablet 81 mg every morning.    [provider]  atorvastatin (LIPITOR) 10 MG tablet 10 mg nightly. 09/03/17  [provider]  cefTRIAXone (ROCEPHIN) 1 g injection  05/09/18   [provider]  fluticasone (FLONASE) 50 MCG/ACT nasal spray 1 spray every morning.    [provider]  Insulin Glargine (LANTUS SOLOSTAR) 100 UNIT/ML Solostar Pen 10 Units every morning. 07/25/17   [provider]  iron polysaccharides (NIFEREX) 150 MG capsule 150 mg every morning.    [provider]  ketorolac (ACULAR) 0.5 % ophthalmic solution  01/04/18   [provider]  lidocaine (XYLOCAINE)  1 % (with preservative) injection  05/06/18   [provider]  metoprolol tartrate (LOPRESSOR) 25 MG tablet 25 mg 2 times daily. 09/16/17   [provider]  mupirocin cream (BACTROBAN) 2 % Apply 2 application topically 2 (two) times daily. 03/02/18   [provider]  polyethylene glycol (MIRALAX / GLYCOLAX) packet 17 g.    [provider]  polyethylene glycol (MIRALAX / GLYCOLAX) packet Take 17 g by mouth as directed.    [provider]  prednisoLONE acetate (PRED FORTE) 1 % ophthalmic suspension  12/21/17   [provider]  Vitamin D, Ergocalciferol, (DRISDOL) 50000 units CAPS capsule 50,000 Units every 30 days. 08/02/17   [provider]    Physical Exam: Vitals:   03-Dec-2018 1637 2018/12/03 1715 2018/12/03 1745 12/03/2018 1800  BP: (!) 132/57 (!) 115/54 134/75 108/69  Pulse: 72 69 71 76  Resp: 20 16 16 18   Temp: 99.6 F (37.6 C)     TempSrc: Oral     SpO2: 100% 100% 99% 100%  Weight:      Height:         Constitutional:   Appears calm and comfortable.  Patient is obese. Eyes:   Pallor.  No jaundice.    ENMT:   external ears, nose appear normal.  Dry buccal mucosa. Neck:   Neck is supple.  JVD is difficult to assess.   Respiratory:   Decreased air entry.  Respiratory effort normal. No retractions or accessory muscle use Cardiovascular:   S1S2  Leg edema.  Right BKA is noted. Abdomen:   Abdomen is obese, soft and non tender. Organs are difficult to assess. Neurologic:   Patient is awake.    Moves all limbs.  Wt Readings from Last 3 Encounters:  Dec 03, 2018 90.7 kg    I have personally reviewed following labs and imaging studies  Labs on Admission:  CBC: Recent Labs  Lab 03-Dec-2018 1645 12-03-18 1654  WBC 8.7  --   NEUTROABS 6.8  --   HGB 8.1* 8.2*  HCT 26.7* 24.0*  MCV 94.7  --   PLT 185  --    Basic Metabolic Panel: Recent Labs  Lab 03-Dec-2018 1645 12-03-18 1653 Dec 03, 2018 1654  NA 142  --  144    K 5.6*  --  5.4*  CL 117*  --   --   CO2 13*  --   --   GLUCOSE 128*  --   --   BUN 72*  --   --   CREATININE 5.90* 5.90*  --   CALCIUM 7.9*  --   --    Liver Function Tests: Recent Labs  Lab 12/03/2018 1645  AST 35  ALT 18  ALKPHOS 183*  BILITOT 0.4  PROT 6.5  ALBUMIN 2.7*   No results for input(s): LIPASE, AMYLASE in the last 168 hours. No results for input(s): AMMONIA in the last 168 hours. Coagulation Profile: No results for input(s): INR, PROTIME in the last 168 hours.  Cardiac Enzymes: Recent Labs  Lab 11/22/2018 1645  TROPONINI 0.11*   BNP (last 3 results) No results for input(s): PROBNP in the last 8760 hours. HbA1C: No results for input(s): HGBA1C in the last 72 hours. CBG: No results for input(s): GLUCAP in the last 168 hours. Lipid Profile: Recent Labs    11/24/2018 1645  TRIG 119   Thyroid Function Tests: No results for input(s): TSH, T4TOTAL, FREET4, T3FREE, THYROIDAB in the last 72 hours. Anemia Panel: Recent Labs    12/03/2018 1645  FERRITIN 501*   Urine analysis: No results found for: COLORURINE, APPEARANCEUR, LABSPEC, PHURINE, GLUCOSEU, HGBUR, BILIRUBINUR, KETONESUR, PROTEINUR, UROBILINOGEN, NITRITE, LEUKOCYTESUR Sepsis Labs: (procalcitonin:4,lacticidven:4) )No results found for this or any previous visit (from the past 240 hour(s)).    Radiological Exams on Admission: Dg Chest Port 1 View  Result Date: 12/09/2018 CLINICAL DATA:  Dyspnea, COVID-19 positive EXAM: PORTABLE CHEST 1 VIEW COMPARISON:  None. FINDINGS: Mild enlargement of the cardiopericardial silhouette. Aortic arch atherosclerosis. Otherwise normal mediastinal contour. No pneumothorax. No pleural effusion. Patchy hazy opacity throughout right greater than left lung. IMPRESSION: Patchy hazy opacity throughout the right greater than left lung, compatible with COVID-19 pneumonia. Mild enlargement of the cardiopericardial silhouette. Electronically Signed   By: Delbert Phenix M.D.    On: 11/22/2018 17:31    EKG: Independently reviewed.   Active Problems:   AKI (acute kidney injury) (HCC)   Assessment/Plan Acute kidney injury on chronic kidney disease stage unknown: Admit patient for further assessment and management. Obtain patient's records as baseline renal function needs to be established. Renal ultrasound Urinalysis C3/C4 ANA Hydrate patient ER team has already consulted nephrology team. Further management depend on hospital course.  Hyperkalemia: Likely secondary to AKI/CKD Lokelma BMP every 6 hours Further management depend on hospital course.  Metabolic acidosis: Likely secondary to AKI/CKD Obtain baseline CO2 as patient may need bicarb chronically Further management will depend on above.  Positive COVID-19/coronavirus: Manage expectantly. Patient is on 2 L supplemental oxygen and saturating at 100%.  Dementia: No behavioral problems noted so far. Continue to monitor closely.  Diabetes mellitus: Continue to optimize.  Hypertension: Continue to optimize.  DVT prophylaxis: Heparin Code Status: Full Family Communication:  Disposition Plan: Will depend on hospital course Consults called: ER provider has already consulted nephrology team Admission status: Inpatient  Time spent: 65 minutes minutes  Berton Mount, MD  [facility] Pager #: [phone] 7PM-7AM contact night coverage as above  11/22/2018, 6:39 PM

## 2018-11-30 NOTE — ED Notes (Signed)
ED TO INPATIENT HANDOFF REPORT  ED Nurse Name and Phone #: [phone] Donetta Potts Name/Age/Gender Julia Thomas 78 y.o. female Room/Bed: 023C/023C  Code Status   Code Status: Not on file  Home/SNF/Other Nursing Home   Triage Complete: Triage complete  Chief Complaint COVID +  Triage Note Pt sent by nursing home with abnormal renal labs.  Pt tested + for coronavirus on 11/27/18.   Allergies No Known Allergies  Level of Care/Admitting Diagnosis ED Disposition    ED Disposition Condition Comment   Admit  Hospital Area: [facility] [[id number]]  Level of Care: Telemetry Medical [104]  Covid Evaluation: Confirmed COVID Positive  Isolation Risk Level: Low Risk/Droplet (Less than 4L [place] supplementation)  Diagnosis: AKI (acute kidney injury) [facility]) [[id number]]  Admitting Physician: Woody Seller  Attending Physician: Berton Mount I [[id number]]  Estimated length of stay: past midnight tomorrow  Certification:: I certify this patient will need inpatient services for at least 2 midnights  PT Class (Do Not Modify): Inpatient [101]  PT Acc Code (Do Not Modify): Private [1]       B Medical/Surgery History Past Medical History:  Diagnosis Date  . Anxiety   . Aphasia   . CHF (congestive heart failure) (HCC)   . Diabetes (HCC)   . Diabetic retinopathy (HCC)    NPDR OU  . Herpes virus disease   . Hyperlipemia   . Hypertension   . Insomnia   . Kidney disease   . Major depression, chronic   . Osteoarthritis   . Pneumonia   . Respiratory failure [facility])    Past Surgical History:  Procedure Laterality Date  . BELOW KNEE LEG AMPUTATION    . CATARACT EXTRACTION    . EYE SURGERY       A IV Location/Drains/Wounds Patient Lines/Drains/Airways Status   Active Line/Drains/Airways    Name:   Placement date:   Placement time:   Site:   Days:   Peripheral IV 11/27/2018 Right Hand   12/07/2018    1646    Hand   less than 1          Intake/Output Last 24  hours  Intake/Output Summary (Last 24 hours) at 12/14/2018 1921 Last data filed at 12/12/2018 1754 Gross per 24 hour  Intake 77.17 ml  Output -  Net 77.17 ml    Labs/Imaging Results for orders placed or performed during the hospital encounter of 12/14/2018 (from the past 48 hour(s))  Lactic acid, plasma     Status: None   Collection Time: 12/04/2018  4:27 PM  Result Value Ref Range   Lactic Acid, Venous 1.3 0.5 - 1.9 mmol/L    Comment: Performed at [facility], [address] [place], [place] [address]  CBC WITH DIFFERENTIAL     Status: Abnormal   Collection Time: 12/09/2018  4:45 PM  Result Value Ref Range   WBC 8.7 4.0 - 10.5 K/uL   RBC 2.82 (L) 3.87 - 5.11 MIL/uL   Hemoglobin 8.1 (L) 12.0 - 15.0 g/dL   HCT 78.2 (L) 95.6 - 21.3 %   MCV 94.7 80.0 - 100.0 fL   MCH 28.7 26.0 - 34.0 pg   MCHC 30.3 30.0 - 36.0 g/dL   RDW 08.6 57.8 - 46.9 %   Platelets 185 150 - 400 K/uL   nRBC 0.0 0.0 - 0.2 %   Neutrophils Relative % 79 %   Neutro Abs 6.8 1.7 - 7.7 K/uL   Lymphocytes Relative  16 %   Lymphs Abs 1.4 0.7 - 4.0 K/uL   Monocytes Relative 4 %   Monocytes Absolute 0.4 0.1 - 1.0 K/uL   Eosinophils Relative 0 %   Eosinophils Absolute 0.0 0.0 - 0.5 K/uL   Basophils Relative 0 %   Basophils Absolute 0.0 0.0 - 0.1 K/uL   Immature Granulocytes 1 %   Abs Immature Granulocytes 0.11 (H) 0.00 - 0.07 K/uL    Comment: Performed at [facility], [address], [place], [place] [address]  Comprehensive metabolic panel     Status: Abnormal   Collection Time: 2018/12/25  4:45 PM  Result Value Ref Range   Sodium 142 135 - 145 mmol/L   Potassium 5.6 (H) 3.5 - 5.1 mmol/L   Chloride 117 (H) 98 - 111 mmol/L   CO2 13 (L) 22 - 32 mmol/L   Glucose, Bld 128 (H) 70 - 99 mg/dL   BUN 72 (H) 8 - 23 mg/dL   Creatinine, Ser 6.04 (H) 0.44 - 1.00 mg/dL   Calcium 7.9 (L) 8.9 - 10.3 mg/dL   Total Protein 6.5 6.5 - 8.1 g/dL   Albumin 2.7 (L) 3.5 - 5.0 g/dL   AST 35 15 - 41 U/L   ALT 18 0 - 44 U/L    Alkaline Phosphatase 183 (H) 38 - 126 U/L   Total Bilirubin 0.4 0.3 - 1.2 mg/dL   GFR calc non Af Amer 6 (L) >60 mL/min   GFR calc Af Amer 7 (L) >60 mL/min   Anion gap 12 5 - 15    Comment: Performed at [facility], [address], [place], [place] [address]  D-dimer, quantitative     Status: Abnormal   Collection Time: 2018/12/25  4:45 PM  Result Value Ref Range   D-Dimer, Quant 1.78 (H) 0.00 - 0.50 ug/mL-FEU    Comment: (NOTE) At the manufacturer cut-off of 0.50 ug/mL FEU, this assay has been documented to exclude PE with a sensitivity and negative predictive value of 97 to 99%.  At this time, this assay has not been approved by the FDA to exclude DVT/VTE. Results should be correlated with clinical presentation. Performed at [facility], [address], [place], [place] [address]   Procalcitonin     Status: None   Collection Time: 12/25/2018  4:45 PM  Result Value Ref Range   Procalcitonin 6.78 ng/mL    Comment:        Interpretation: PCT > 2 ng/mL: Systemic infection (sepsis) is likely, unless other causes are known. (NOTE)       Sepsis PCT Algorithm           Lower Respiratory Tract                                      Infection PCT Algorithm    ----------------------------     ----------------------------         PCT < 0.25 ng/mL                PCT < 0.10 ng/mL         Strongly encourage             Strongly discourage   discontinuation of antibiotics    initiation of antibiotics    ----------------------------     -----------------------------       PCT 0.25 - 0.50 ng/mL  PCT 0.10 - 0.25 ng/mL               OR       >80% decrease in PCT            Discourage initiation of                                            antibiotics      Encourage discontinuation           of antibiotics    ----------------------------     -----------------------------         PCT >= 0.50 ng/mL              PCT 0.26 - 0.50 ng/mL               AND       <80% decrease in  PCT              Encourage initiation of                                             antibiotics       Encourage continuation           of antibiotics    ----------------------------     -----------------------------        PCT >= 0.50 ng/mL                  PCT > 0.50 ng/mL               AND         increase in PCT                  Strongly encourage                                      initiation of antibiotics    Strongly encourage escalation           of antibiotics                                     -----------------------------                                           PCT <= 0.25 ng/mL                                                 OR                                        > 80% decrease in PCT  Discontinue / Do not initiate                                             antibiotics Performed at [facility], [address] [place], [place] [address]   Lactate dehydrogenase     Status: Abnormal   Collection Time: January 18, 2019  4:45 PM  Result Value Ref Range   LDH 328 (H) 98 - 192 U/L    Comment: Performed at [facility], [address] [place], [place] [address]  Ferritin     Status: Abnormal   Collection Time: January 18, 2019  4:45 PM  Result Value Ref Range   Ferritin 501 (H) 11 - 307 ng/mL    Comment: Performed at [facility], [address] [place], [place] [address]  Triglycerides     Status: None   Collection Time: January 18, 2019  4:45 PM  Result Value Ref Range   Triglycerides 119 <150 mg/dL    Comment: Performed at [facility], [address] [place], [place] [address]  Fibrinogen     Status: Abnormal   Collection Time: January 18, 2019  4:45 PM  Result Value Ref Range   Fibrinogen 591 (H) 210 - 475 mg/dL    Comment: Performed at [facility], [address] [place], [place] [address]  C-reactive protein     Status: Abnormal   Collection Time: January 18, 2019  4:45 PM  Result Value Ref Range   CRP 15.8 (H) <1.0 mg/dL     Comment: Performed at [facility], [address] [place], [place] [address]  Troponin I - Once     Status: Abnormal   Collection Time: January 18, 2019  4:45 PM  Result Value Ref Range   Troponin I 0.11 (HH) <0.03 ng/mL    Comment: CRITICAL RESULT CALLED TO, READ BACK BY AND VERIFIED WITH: A OLEARY,RN 1743 04-14-19 D BRADLEY Performed at [facility], [address] [place], [place] [address]   I-Stat Creatinine, ED (not at [facility])     Status: Abnormal   Collection Time: January 18, 2019  4:53 PM  Result Value Ref Range   Creatinine, Ser 5.90 (H) 0.44 - 1.00 mg/dL  POCT I-Stat EG7     Status: Abnormal   Collection Time: January 18, 2019  4:54 PM  Result Value Ref Range   pH, Ven 7.277 7.250 - 7.430   pCO2, Ven 32.7 (L) 44.0 - 60.0 mmHg   pO2, Ven 37.0 32.0 - 45.0 mmHg   Bicarbonate 15.3 (L) 20.0 - 28.0 mmol/L   TCO2 16 (L) 22 - 32 mmol/L   O2 Saturation 65.0 %   Acid-base deficit 11.0 (H) 0.0 - 2.0 mmol/L   Sodium 144 135 - 145 mmol/L   Potassium 5.4 (H) 3.5 - 5.1 mmol/L   Calcium, Ion 1.08 (L) 1.15 - 1.40 mmol/L   HCT 24.0 (L) 36.0 - 46.0 %   Hemoglobin 8.2 (L) 12.0 - 15.0 g/dL   Patient temperature HIDE    Sample type VENOUS    Comment NOTIFIED PHYSICIAN   Lactic acid, plasma     Status: None   Collection Time: January 18, 2019  5:35 PM  Result Value Ref Range   Lactic Acid, Venous 1.1 0.5 - 1.9 mmol/L    Comment: Performed at [facility], [address] [place], [place] [address]   Dg Chest  Port 1 View  Result Date: 12/05/2018 CLINICAL DATA:  Dyspnea, COVID-19 positive EXAM: PORTABLE CHEST 1 VIEW COMPARISON:  None. FINDINGS: Mild enlargement of the cardiopericardial silhouette. Aortic arch atherosclerosis. Otherwise normal mediastinal contour. No pneumothorax. No pleural effusion. Patchy hazy opacity throughout right greater than left lung. IMPRESSION: Patchy hazy opacity throughout the right greater than left lung, compatible with COVID-19 pneumonia. Mild enlargement of the  cardiopericardial silhouette. Electronically Signed   By: Delbert Phenix M.D.   On: 11/22/2018 17:31    Pending Labs Unresulted Labs (From admission, onward)    Start     Ordered   12/01/18 0500  Basic metabolic panel  Daily,   R     12/09/2018 1831   12/09/2018 1728  Urine culture  ONCE - STAT,   STAT     12/05/2018 1727   12/09/2018 1727  Urinalysis, Routine w reflex microscopic  Once,   R     12/09/2018 1726   12/04/2018 1627  Blood Culture (routine x 2)  BLOOD CULTURE X 2,   STAT     12/09/2018 1627   Signed and Held  ABO/Rh  Once,   R     Signed and Held   Signed and Held  CBC  (heparin)  Once,   R    Comments:  Baseline for heparin therapy IF NOT ALREADY DRAWN.  Notify MD if PLT < 100 K.    Signed and Held   Signed and Held  Creatinine, serum  (heparin)  Once,   R    Comments:  Baseline for heparin therapy IF NOT ALREADY DRAWN.    Signed and Held   Signed and Held  Magnesium  Once,   R     Signed and Held   Signed and Held  Phosphorus  Once,   R     Signed and Medical illustrator and Held  Urinalysis, Complete w Microscopic  Once,   R     Signed and Held   Medical illustrator and Armed forces training and education officer morning,   R     Signed and Held   Medical illustrator and Held  CBC  Tomorrow morning,   R     Signed and Held   Medical illustrator and Held  D-dimer, quantitative (not at [employer])  Daily,   R     Signed and Held   Signed and Held  Ferritin  Daily,   R     Signed and Held   Signed and Held  Culture, sputum-assessment  Once,   R     Signed and Held   Signed and Held  C3 complement  Once,   R     Signed and Held   Signed and Held  C4 complement  Once,   R     Signed and Held   Signed and Held  ANA, IFA (with reflex)  Once,   R     Signed and Held   Signed and [employer] metabolic panel  Now then every 6 hours,   R     Signed and Held   Signed and Held  Procalcitonin - Baseline  ONCE - STAT,   STAT     Signed and Held          Vitals/Pain Today's Vitals   11/23/2018 1731 11/19/2018 1745 12/11/2018 1800  12/13/2018 1845  BP:  134/75 108/69 117/61  Pulse:  71 76 74  Resp:  Temp:  TempSrc:      SpO2:  99% 100% 99%  Weight:      Height:      PainSc: 0-No pain       Isolation Precautions No active isolations  Medications Medications  vancomycin (VANCOCIN) 1,750 mg in sodium chloride 0.9 % 500 mL IVPB (1,750 mg Intravenous New Bag/Given 11/17/2018 1759)  ceFEPIme (MAXIPIME) 1 g in sodium chloride 0.9 % 100 mL IVPB (has no administration in time range)  vancomycin variable dose per unstable renal function (pharmacist dosing) (has no administration in time range)  ceFEPIme (MAXIPIME) 2 g in sodium chloride 0.9 % 100 mL IVPB (0 g Intravenous Stopped 10-Dec-2018 1754)  sodium chloride 0.9 % bolus 1,000 mL (1,000 mLs Intravenous New Bag/Given 11/23/2018 1721)  sodium zirconium cyclosilicate (LOKELMA) packet 10 g (10 g Oral Given 11/18/2018 1751)    Mobility walks with person assist High fall risk   Focused Assessments Renal Assessment Handoff:  Hemodialysis Schedule:  Last Hemodialysis date and time:       R Recommendations: See Admitting Provider Note  Report given to:   Additional Notes:

## 2018-11-30 NOTE — ED Triage Notes (Signed)
Pt sent by nursing home with abnormal renal labs.  Pt tested + for coronavirus on 11/27/18.

## 2018-11-30 NOTE — ED Notes (Signed)
Report given to 2W RN. All questions answered 

## 2018-11-30 NOTE — ED Provider Notes (Signed)
[facility] EMERGENCY DEPARTMENT Provider Note   CSN: [id number] Arrival date & time: 11/25/2018  1617    History   Chief Complaint Chief Complaint  Patient presents with  . Shortness of Breath    HPI Julia Thomas is a 78 y.o. 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 Patient is from a nursing home and is demented at baseline. Patient tested positive for COVID on 5/11. She has not been eating and drinking much.  Reported some vomiting at the facility.  Patient denies any worsening shortness of breath.  She had an x-ray performed there that showed a pneumonia and was started on Levaquin for the last 3 days.  She also had labs drawn today which showed renal failure with creatinine of 5.9 and a bicarb of 13 and a anion gap of 24.  She was sent here for hydration as well as renal failure.  She has a history of CKD but there is no creatinine in the chart.  Not currently on dialysis.     The history is provided by the patient and the EMS personnel. The history is limited by the condition of the patient.    Past Medical History:  Diagnosis Date  . Anxiety   . Aphasia   . CHF (congestive heart failure) (HCC)   . Diabetes (HCC)   . Diabetic retinopathy (HCC)    NPDR OU  . Herpes virus disease   . Hyperlipemia   . Hypertension   . Insomnia   . Kidney disease   . Major depression, chronic   . Osteoarthritis   . Pneumonia   . Respiratory failure [facility])     Patient Active Problem List   Diagnosis Date Noted  . 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 BELOW KNEE LEG AMPUTATION    . CATARACT EXTRACTION    . EYE SURGERY       OB History   No obstetric history on file.      Home Medications    Prior to Admission medications   Medication Sig Start Date End Date Taking? Authorizing Provider  acetaminophen (TYLENOL) 325 MG tablet 650 mg 3 times daily.     [provider]  aspirin EC 81 MG tablet 81 mg every morning.    [provider]  atorvastatin (LIPITOR) 10 MG tablet 10 mg nightly. 09/03/17   [provider]  cefTRIAXone (ROCEPHIN) 1 g injection  05/09/18   [provider]  fluticasone (FLONASE) 50 MCG/ACT nasal spray 1 spray every morning.    [provider]  Insulin Glargine (LANTUS SOLOSTAR) 100 UNIT/ML Solostar Pen 10 Units every morning. 07/25/17   [provider]  iron polysaccharides (NIFEREX) 150 MG capsule 150 mg every morning.    [provider]  ketorolac (ACULAR) 0.5 % ophthalmic solution  01/04/18   [provider]  lidocaine (XYLOCAINE) 1 % (with preservative) injection  05/06/18   [provider]  metoprolol tartrate (LOPRESSOR) 25 MG tablet 25 mg 2 times daily. 09/16/17   [provider]  mupirocin cream (BACTROBAN) 2 % Apply 2 application topically 2 (two) times daily. 03/02/18   [provider]  polyethylene glycol (MIRALAX / GLYCOLAX) packet 17 g.    [provider]  polyethylene glycol (MIRALAX / GLYCOLAX) packet Take 17 g by mouth as directed.    [provider]  prednisoLONE acetate (  PRED FORTE) 1 % ophthalmic suspension  12/21/17   [provider]  Vitamin D, Ergocalciferol, (DRISDOL) 50000 units CAPS capsule 50,000 Units every 30 days. 08/02/17   [provider]    Family History History reviewed. No pertinent family history.  Social History Social History   Tobacco Use  . Smoking status: Never Smoker  . Smokeless tobacco: Never Used  Substance Use Topics  . Alcohol use: Never    Frequency: Never  . Drug use: Never     Allergies   Patient has no known allergies.   Review of Systems Review of Systems  Respiratory: Positive for shortness of breath.   All other systems reviewed and are negative.    Physical Exam Updated Vital Signs BP 108/69   Pulse 76   Temp 99.6 F (37.6  C) (Oral)   Resp 18   Ht 5\' 4"  (1.626 m)   Wt 90.7 kg   SpO2 100%   BMI 34.33 kg/m   Physical Exam Vitals signs reviewed.  Constitutional:      Comments: Chronically ill   HENT:     Head: Normocephalic.     Mouth/Throat:     Mouth: Mucous membranes are moist.  Eyes:     Pupils: Pupils are equal, round, and reactive to light.  Neck:     Musculoskeletal: Normal range of motion.  Cardiovascular:     Rate and Rhythm: Normal rate and regular rhythm.  Pulmonary:     Comments: Slightly tachypneic, crackles R base  Abdominal:     General: Bowel sounds are normal.     Palpations: Abdomen is soft.  Musculoskeletal: Normal range of motion.  Skin:    General: Skin is warm.     Capillary Refill: Capillary refill takes less than 2 seconds.  Neurological:     General: No focal deficit present.     Comments: Demented, confused, A & O x 1. Moving all extremities   Psychiatric:        Mood and Affect: Mood normal.        Behavior: Behavior normal.      ED Treatments / Results  Labs (all labs ordered are listed, but only abnormal results are displayed) Labs Reviewed  CBC WITH DIFFERENTIAL/PLATELET - Abnormal; Notable for the following components:      Result Value   RBC 2.82 (*)    Hemoglobin 8.1 (*)    HCT 26.7 (*)    Abs Immature Granulocytes 0.11 (*)    All other components within normal limits  COMPREHENSIVE METABOLIC PANEL - Abnormal; Notable for the following components:   Potassium 5.6 (*)    Chloride 117 (*)    CO2 13 (*)    Glucose, Bld 128 (*)    BUN 72 (*)    Creatinine, Ser 5.90 (*)    Calcium 7.9 (*)    Albumin 2.7 (*)    Alkaline Phosphatase 183 (*)    GFR calc non Af Amer 6 (*)    GFR calc Af Amer 7 (*)    All other components within normal limits  D-DIMER, QUANTITATIVE (NOT AT [facility]) - Abnormal; Notable for the following components:   D-Dimer, Quant 1.78 (*)    All other components within normal limits  LACTATE DEHYDROGENASE - Abnormal; Notable for the  following components:   LDH 328 (*)    All other components within normal limits  FERRITIN - Abnormal; Notable for the following components:   Ferritin 501 (*)    All  other components within normal limits  FIBRINOGEN - Abnormal; Notable for the following components:   Fibrinogen 591 (*)    All other components within normal limits  C-REACTIVE PROTEIN - Abnormal; Notable for the following components:   CRP 15.8 (*)    All other components within normal limits  TROPONIN I - Abnormal; Notable for the following components:   Troponin I 0.11 (*)    All other components within normal limits  I-STAT CREATININE, ED - Abnormal; Notable for the following components:   Creatinine, Ser 5.90 (*)    All other components within normal limits  POCT I-STAT EG7 - Abnormal; Notable for the following components:   pCO2, Ven 32.7 (*)    Bicarbonate 15.3 (*)    TCO2 16 (*)    Acid-base deficit 11.0 (*)    Potassium 5.4 (*)    Calcium, Ion 1.08 (*)    HCT 24.0 (*)    Hemoglobin 8.2 (*)    All other components within normal limits  CULTURE, BLOOD (ROUTINE X 2)  CULTURE, BLOOD (ROUTINE X 2)  URINE CULTURE  LACTIC ACID, PLASMA  LACTIC ACID, PLASMA  TRIGLYCERIDES  PROCALCITONIN  URINALYSIS, ROUTINE W REFLEX MICROSCOPIC    EKG EKG Interpretation  Date/Time:  Thursday December 28, 2018 16:34:23 EDT Ventricular Rate:  74 PR Interval:    QRS Duration: 90 QT Interval:  376 QTC Calculation: 418 R Axis:   63 Text Interpretation:  Sinus rhythm Borderline repolarization abnormality No previous ECGs available Confirmed by Richardean Canal ([id number]) on Dec 28, 2018 4:41:03 PM   Radiology Dg Chest Port 1 View  Result Date: 2018/12/28 CLINICAL DATA:  Dyspnea, COVID-19 positive EXAM: PORTABLE CHEST 1 VIEW COMPARISON:  None. FINDINGS: Mild enlargement of the cardiopericardial silhouette. Aortic arch atherosclerosis. Otherwise normal mediastinal contour. No pneumothorax. No pleural effusion. Patchy hazy opacity  throughout right greater than left lung. IMPRESSION: Patchy hazy opacity throughout the right greater than left lung, compatible with COVID-19 pneumonia. Mild enlargement of the cardiopericardial silhouette. Electronically Signed   By: Delbert Phenix M.D.   On: 12/28/2018 17:31    Procedures Procedures (including critical care time)  CRITICAL CARE Performed by: Richardean Canal   Total critical care time: 30 minutes  Critical care time was exclusive of separately billable procedures and treating other patients.  Critical care was necessary to treat or prevent imminent or life-threatening deterioration.  Critical care was time spent personally by me on the following activities: development of treatment plan with patient and/or surrogate as well as nursing, discussions with consultants, evaluation of patient's response to treatment, examination of patient, obtaining history from patient or surrogate, ordering and performing treatments and interventions, ordering and review of laboratory studies, ordering and review of radiographic studies, pulse oximetry and re-evaluation of patient's condition.   Medications Ordered in ED Medications  vancomycin (VANCOCIN) 1,750 mg in sodium chloride 0.9 % 500 mL IVPB (1,750 mg Intravenous New Bag/Given Dec 28, 2018 1759)  ceFEPIme (MAXIPIME) 2 g in sodium chloride 0.9 % 100 mL IVPB (0 g Intravenous Stopped 12-28-2018 1754)  sodium chloride 0.9 % bolus 1,000 mL (1,000 mLs Intravenous New Bag/Given 12/28/2018 1721)  sodium zirconium cyclosilicate (LOKELMA) packet 10 g (10 g Oral Given Dec 28, 2018 1751)     Initial Impression / Assessment and Plan / ED Course  I have reviewed the triage vital signs and the nursing notes.  Pertinent labs & imaging results that were available during my care of the patient were reviewed by me and considered in my  medical decision making (see chart for details).       Fidela SalisburyDorothy Garcilazo is a [age] y.o. female here with dehydration, pneumonia, +  COVID. Patient was noted to be in renal failure. No baseline Cr in the chart. Will repeat chemistry and repeat cxr. She has COVID testing that was positive in the facility but she is not hypoxic currently. Will obtain COVID pre admission labs.   6:32 PM Patient's Cr is 5.9. K 5.4. I talked to Dr. Signe ColtUpton from nephrology. She recommend Lokelma for hyperkalemia, gentle hydration. If kidney function worsen, then call nephrology for consult. Will need strict I & Os. CXR showed pneumonia. Given vanc/cefepime. Hospitalist to admit.    Final Clinical Impressions(s) / ED Diagnoses   Final diagnoses:  None    ED Discharge Orders    None       Charlynne PanderYao, Caelynn Marshman Hsienta, MD 11/27/2018 [id number]

## 2018-12-01 ENCOUNTER — Inpatient Hospital Stay (HOSPITAL_COMMUNITY): Payer: 59

## 2018-12-01 DIAGNOSIS — J189 Pneumonia, unspecified organism: Secondary | ICD-10-CM

## 2018-12-01 DIAGNOSIS — I10 Essential (primary) hypertension: Secondary | ICD-10-CM

## 2018-12-01 LAB — GLUCOSE, CAPILLARY
Glucose-Capillary: 95 mg/dL (ref 70–99)
Glucose-Capillary: 87 mg/dL (ref 70–99)
Glucose-Capillary: 107 mg/dL — ABNORMAL HIGH (ref 70–99)
Glucose-Capillary: 110 mg/dL — ABNORMAL HIGH (ref 70–99)

## 2018-12-01 LAB — URINE CULTURE: Culture: <10000 — AB

## 2018-12-01 LAB — BASIC METABOLIC PANEL
Anion gap: 8 (ref 5–15)
Anion gap: 9 (ref 5–15)
Anion gap: 11 (ref 5–15)
BUN: 70 mg/dL — ABNORMAL HIGH (ref 8–23)
BUN: 68 mg/dL — ABNORMAL HIGH (ref 8–23)
BUN: 72 mg/dL — ABNORMAL HIGH (ref 8–23)
CO2: 14 mmol/L — ABNORMAL LOW (ref 22–32)
CO2: 14 mmol/L — ABNORMAL LOW (ref 22–32)
CO2: 13 mmol/L — ABNORMAL LOW (ref 22–32)
Calcium: 7.5 mg/dL — ABNORMAL LOW (ref 8.9–10.3)
Calcium: 7.6 mg/dL — ABNORMAL LOW (ref 8.9–10.3)
Calcium: 7.8 mg/dL — ABNORMAL LOW (ref 8.9–10.3)
Chloride: 121 mmol/L — ABNORMAL HIGH (ref 98–111)
Chloride: 123 mmol/L — ABNORMAL HIGH (ref 98–111)
Chloride: 122 mmol/L — ABNORMAL HIGH (ref 98–111)
Creatinine, Ser: 5.73 mg/dL — ABNORMAL HIGH (ref 0.44–1.00)
Creatinine, Ser: 6.04 mg/dL — ABNORMAL HIGH (ref 0.44–1.00)
Creatinine, Ser: 6.34 mg/dL — ABNORMAL HIGH (ref 0.44–1.00)
GFR calc Af Amer: 8 mL/min — ABNORMAL LOW (ref >60)
GFR calc Af Amer: 7 mL/min — ABNORMAL LOW (ref >60)
GFR calc Af Amer: 7 mL/min — ABNORMAL LOW (ref >60)
GFR calc non Af Amer: 7 mL/min — ABNORMAL LOW (ref >60)
GFR calc non Af Amer: 6 mL/min — ABNORMAL LOW (ref >60)
GFR calc non Af Amer: 6 mL/min — ABNORMAL LOW (ref >60)
Glucose, Bld: 109 mg/dL — ABNORMAL HIGH (ref 70–99)
Glucose, Bld: 109 mg/dL — ABNORMAL HIGH (ref 70–99)
Glucose, Bld: 122 mg/dL — ABNORMAL HIGH (ref 70–99)
Potassium: 5.3 mmol/L — ABNORMAL HIGH (ref 3.5–5.1)
Potassium: 5.4 mmol/L — ABNORMAL HIGH (ref 3.5–5.1)
Potassium: 5.6 mmol/L — ABNORMAL HIGH (ref 3.5–5.1)
Sodium: 143 mmol/L (ref 135–145)
Sodium: 146 mmol/L — ABNORMAL HIGH (ref 135–145)
Sodium: 146 mmol/L — ABNORMAL HIGH (ref 135–145)

## 2018-12-01 LAB — FERRITIN: Ferritin: 528 ng/mL — ABNORMAL HIGH (ref 11–307)

## 2018-12-01 LAB — CBC
HCT: 24.8 % — ABNORMAL LOW (ref 36.0–46.0)
Hemoglobin: 7.7 g/dL — ABNORMAL LOW (ref 12.0–15.0)
MCH: 28.6 pg (ref 26.0–34.0)
MCHC: 31 g/dL (ref 30.0–36.0)
MCV: 92.2 fL (ref 80.0–100.0)
Platelets: 163 10*3/uL (ref 150–400)
RBC: 2.69 MIL/uL — ABNORMAL LOW (ref 3.87–5.11)
RDW: 12.8 % (ref 11.5–15.5)
WBC: 7.1 10*3/uL (ref 4.0–10.5)
nRBC: 0 % (ref 0.0–0.2)

## 2018-12-01 LAB — D-DIMER, QUANTITATIVE: D-Dimer, Quant: 2.31 ug/mL-FEU — ABNORMAL HIGH (ref 0.00–0.50)

## 2018-12-01 LAB — LACTIC ACID, PLASMA: Lactic Acid, Venous: 0.9 mmol/L (ref 0.5–1.9)

## 2018-12-01 MED ORDER — VANCOMYCIN VARIABLE DOSE PER UNSTABLE RENAL FUNCTION (PHARMACIST DOSING): Status: DC

## 2018-12-01 MED ORDER — SODIUM ZIRCONIUM CYCLOSILICATE 10 G PO PACK
10.0000 g | PACK | Freq: Two times a day (BID) | ORAL | Status: DC
  Administered 2018-12-01 – 2018-12-03 (×3): 10 g via ORAL
  Filled 2018-12-01 (×4): qty 1

## 2018-12-01 MED ORDER — ACETAMINOPHEN 325 MG PO TABS
650.0000 mg | ORAL_TABLET | Freq: Four times a day (QID) | ORAL | Status: AC | PRN
  Administered 2018-12-01: 650 mg via ORAL
  Filled 2018-12-01 (×2): qty 2

## 2018-12-01 MED ORDER — SODIUM BICARBONATE 650 MG PO TABS
1300.0000 mg | ORAL_TABLET | Freq: Three times a day (TID) | ORAL | Status: DC
  Administered 2018-12-01 – 2018-12-03 (×8): 1300 mg via ORAL
  Filled 2018-12-01 (×10): qty 2

## 2018-12-01 MED ORDER — ACETAMINOPHEN 650 MG RE SUPP
650.0000 mg | Freq: Four times a day (QID) | RECTAL | Status: DC | PRN
  Administered 2018-12-01: 650 mg via RECTAL
  Filled 2018-12-01: qty 1

## 2018-12-01 MED ORDER — SODIUM CHLORIDE 0.9 % IV BOLUS
500.0000 mL | Freq: Once | INTRAVENOUS | Status: AC
  Administered 2018-12-01: 500 mL via INTRAVENOUS

## 2018-12-01 MED ORDER — SODIUM CHLORIDE 0.9 % IV SOLN
1.0000 g | INTRAVENOUS | Status: DC
  Administered 2018-12-01 – 2018-12-02 (×2): 1 g via INTRAVENOUS
  Filled 2018-12-01 (×4): qty 1

## 2018-12-01 NOTE — Progress Notes (Signed)
Julia Thomas [facility].  PROGRESS NOTE    Julia SalisburyDorothy Thomas  ZOX:[id number] DOB: 01/05/1941 DOA: 12/01/2018 PCP: [name], Daniel, MD   Brief Narrative:   Patient is a 78 year old African-American female, skilled nursing facility resident, with past medical history significant for recently positive test for COVID 19 3 days ago, chronic kidney disease with unknown baseline, diabetes mellitus, hypertension, CHF, dementia amongst other past medical and cardiac history.  Patient is unable to give any significant history.  Over the last several days, patient is said to have developed decreased p.o. intake.  BMP MP done at the skilled nursing facility revealed that the patient had developed acute kidney injury, hence, the decision to transfer patient to the hospital for further assessment and management.  On presentation to the hospital, BUN was noted to be 72, weight serum creatinine of 5.9, potassium of 5.6 and CO2 of 13.  Patient is saturating at 100% on 2 L supplemental oxygen.  Patient be admitted for further assessment and management.    Assessment & Plan:   Active Problems:   AKI (acute kidney injury) (HCC)   Acute kidney injury on chronic kidney disease stage unknown:     - baseline renal function unknown, patient not able to tell at this time     - nephrology consulted; no HD at this point, will determin over next 24 - 48 hours; appreciate assistance  Hyperkalemia:     - Likely secondary to AKI/CKD     - Lokelma     - BMP every 6 hours  Metabolic acidosis:     - Likely secondary to AKI/CKD     - bicarb tablets per nephro  Positive COVID-19/coronavirus:     - Manage expectantly.     - Patient is on 2 L supplemental oxygen and saturating at 100%.  PNA     - hazy opacity noted on CXR c/w COVID     - s/p vanc/cefepime x 1; let's continue that for now w/ pharm consult for dosing until we are sure COVID is source     - if COVID is source and patient not needing HD, will consider Tx to [facility]  Dementia:      - No behavioral problems noted so far.     - Continue to monitor closely.  Diabetes mellitus:     - SSI  Hypertension:     - metoprolol  Upgrade to progressive  DVT prophylaxis: Heparin Code Status: FULL Family Communication: None at beside   Disposition Plan: TBD   Consultants:   Nephrology  Procedures:   None  Antimicrobials:  . Vanc, cefepime    Subjective: Acutely ill, confused.   Objective: Vitals:   12/01/18 0943 12/01/18 1100 12/01/18 1300 12/01/18 1600  BP: 120/66 118/63  125/67  Pulse: 97 97  96  Resp:  18    Temp: (!) 102 F (38.9 C) (!) 101.3 F (38.5 C) (!) 100.5 F (38.1 C) 99.1 F (37.3 C)  TempSrc: Rectal Rectal  Rectal  SpO2:  93%  95%  Weight:      Height:        Intake/Output Summary (Last 24 hours) at 12/01/2018 1725 Last data filed at 12/01/2018 1400 Gross per 24 hour  Intake 3914.29 ml  Output 725 ml  Net 3189.29 ml   Filed Weights   12/09/2018 1633 11/20/2018 2018  Weight: 90.7 kg 109.6 kg    Examination:  General: 78 y.o. female resting in bed in mild distress Cardiovascular: tachy, +S1, S2, no m/g/r, equal  pulses throughout Respiratory: b/l rhonchi, normal WOB GI: BS+, NDNT, no masses noted, no organomegaly noted MSK: No e/c/c Skin: No rashes, bruises, ulcerations noted Neuro: confused, following some commands   Data Reviewed: I have personally reviewed following labs and imaging studies.  CBC: Recent Labs  Lab Dec 29, 2018 1645 12-29-2018 1654 2018/12/29 2109 12/01/18 0438  WBC 8.7  --  7.3 7.1  NEUTROABS 6.8  --   --   --   HGB 8.1* 8.2* 7.5* 7.7*  HCT 26.7* 24.0* 24.8* 24.8*  MCV 94.7  --  93.6 92.2  PLT 185  --  161 163   Basic Metabolic Panel: Recent Labs  Lab 2018/12/29 1645 12-29-2018 1653 2018-12-29 1654 12-29-2018 2109 12/01/18 0438 12/01/18 0844  NA 142  --  144 145 143 146*  K 5.6*  --  5.4* 5.4* 5.3* 5.4*  CL 117*  --   --  119* 121* 123*  CO2 13*  --   --  16* 14* 14*  GLUCOSE 128*  --   --   111* 109* 109*  BUN 72*  --   --  73* 70* 68*  CREATININE 5.90* 5.90*  --  5.90* 5.73* 6.04*  CALCIUM 7.9*  --   --  7.5* 7.5* 7.6*  MG  --   --   --  2.1  --   --   PHOS  --   --   --  4.7*  --   --    GFR: Estimated Creatinine Clearance: 9.6 mL/min (A) (by C-G formula based on SCr of 6.04 mg/dL (H)). Liver Function Tests: Recent Labs  Lab 12-29-18 1645  AST 35  ALT 18  ALKPHOS 183*  BILITOT 0.4  PROT 6.5  ALBUMIN 2.7*   No results for input(s): LIPASE, AMYLASE in the last 168 hours. No results for input(s): AMMONIA in the last 168 hours. Coagulation Profile: No results for input(s): INR, PROTIME in the last 168 hours. Cardiac Enzymes: Recent Labs  Lab 2018/12/29 1645  TROPONINI 0.11*   BNP (last 3 results) No results for input(s): PROBNP in the last 8760 hours. HbA1C: No results for input(s): HGBA1C in the last 72 hours. CBG: Recent Labs  Lab 12-29-18 2210 12/01/18 0750 12/01/18 1125 12/01/18 1602  GLUCAP 102* 95 87 107*   Lipid Profile: Recent Labs    December 29, 2018 1645  TRIG 119   Thyroid Function Tests: No results for input(s): TSH, T4TOTAL, FREET4, T3FREE, THYROIDAB in the last 72 hours. Anemia Panel: Recent Labs    2018-12-29 1645 12/01/18 0438  FERRITIN 501* 528*   Sepsis Labs: Recent Labs  Lab Dec 29, 2018 1627 December 29, 2018 1645 12-29-18 1735 Dec 29, 2018 2109 12/01/18 0615  PROCALCITON  --  6.78  --  7.19  --   LATICACIDVEN 1.3  --  1.1  --  0.9    Recent Results (from the past 240 hour(s))  Blood Culture (routine x 2)     Status: None (Preliminary result)   Collection Time: Dec 29, 2018  4:45 PM  Result Value Ref Range Status   Specimen Description BLOOD RIGHT HAND  Final   Special Requests   Final    BOTTLES DRAWN AEROBIC AND ANAEROBIC Blood Culture adequate volume   Culture   Final    NO GROWTH < 24 HOURS Performed at [facility], [address], [place], [place] [address]    Report Status PENDING  Incomplete  Blood Culture (routine x 2)      Status: None (Preliminary result)  Collection Time: 12-30-18  5:10 PM  Result Value Ref Range Status   Specimen Description BLOOD LEFT HAND  Final   Special Requests   Final    BOTTLES DRAWN AEROBIC ONLY Blood Culture results may not be optimal due to an inadequate volume of blood received in culture bottles   Culture   Final    NO GROWTH < 24 HOURS Performed at [facility], [address] [place], [place] [address]    Report Status PENDING  Incomplete         Radiology Studies: Dg Chest Port 1 View  Result Date: 30-Dec-2018 CLINICAL DATA:  Dyspnea, COVID-19 positive EXAM: PORTABLE CHEST 1 VIEW COMPARISON:  None. FINDINGS: Mild enlargement of the cardiopericardial silhouette. Aortic arch atherosclerosis. Otherwise normal mediastinal contour. No pneumothorax. No pleural effusion. Patchy hazy opacity throughout right greater than left lung. IMPRESSION: Patchy hazy opacity throughout the right greater than left lung, compatible with COVID-19 pneumonia. Mild enlargement of the cardiopericardial silhouette. Electronically Signed   By: Delbert Phenix M.D.   On: 2018-12-30 17:31   Dg Abd Portable 1v  Result Date: Dec 30, 2018 CLINICAL DATA:  Abdominal pain and distension. EXAM: PORTABLE ABDOMEN - 1 VIEW COMPARISON:  None. FINDINGS: Evidence of free air. No small bowel dilatation to suggest obstruction. Mild gaseous distention of colon. Stool distends the rectum. Multiple overlying monitoring devices. No radiopaque calculi. IMPRESSION: Nonobstructive bowel gas pattern with mild gaseous colonic distention, suspect a degree of colonic ileus. Mild rectal distention with stool. Electronically Signed   By: Narda Rutherford M.D.   On: Dec 30, 2018 23:44        Scheduled Meds: . heparin  5,000 Units Subcutaneous Q8H  . insulin aspart  0-5 Units Subcutaneous QHS  . insulin aspart  0-9 Units Subcutaneous TID WC  . metoprolol tartrate  25 mg Oral BID  . sodium bicarbonate  1,300 mg Oral TID  .  sodium zirconium cyclosilicate  5 g Oral TID  . vancomycin variable dose per unstable renal function (pharmacist dosing)   Does not apply See admin instructions   Continuous Infusions: . ceFEPime (MAXIPIME) IV       LOS: 1 day    Time spent: 45 minutes spent in the coordination of care today.    Teddy Spike, DO [facility] Pager [phone]  If 7PM-7AM, please contact night-coverage [email or web address] Password [email or web address] 12/01/2018, 5:25 PM

## 2018-12-01 NOTE — Consult Note (Signed)
[facility]  INPATIENT CONSULTATION  Reason for Consultation: AKI Requesting Provider: Dr. Ronaldo Miyamoto  HPI: Julia Thomas is an 78 y.o. female with DM, HTN, dementia, HL,OA, h/o CKD with unknown baseline who presented to [facility] ED yesterday evening from her SNF ([facility]) with dyspnea and abnormal labs in the setting of COVID19+ test 3 days prior.  Nephrology is consulted for AKI.   Per reports reviewed in her medical chart she was reported to have poor po intake in the days prior to admission, perhaps with emesis as well.  CXR showed +pneumonia and she was started on levaquin 3 days prior.  Labs checked at the facility showed Cr 5.9, bicarb 13.  For those reasons she was sent to the ED.  By report she has CKD - staff at SNF tells me 10/2018 Cr 3.5.  Daughter unaware of CKD details.    Presenting labs showed K 5.6, Bicarb 14, BUN 72, Cr 5.9, albumin 2.7, LDH 328, ferritin 501, CRP 16, WBC 8.7, Hb 8.1 PLt 185, Ddimer 1.78, fibrinogen 591. Lactate normal.   Since presentation yesterday afternoon net I/Os +3.2L.  Yesterday UOP documented.  Spoke with RN and today 7am to 3pm; currently NS 157mL/hr. She has been febrile with Tm 103 at 3am.  BP have been normal.  Sats initially 100% on 2L [place], now 93% on 2L.  She's rec'd vancomycin and cefepime.  Lokelma 10g yesterday, 5 TID now.   Most recent labs at 8:44 am show Na 146, K 5.4, Bicarb 14, BUN 68, CR 6.04, Ca 7.6.   I spoke with her RN today who tells me she is oriented only to person.  She is taking oral pills if crushed.  She has eaten some today as long as food doesn't require much chewing and she is hand fed.    I spoke with her daughter who tells me at baseline she is oriented and fully conversant.  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  Earlier in the week she called her daughter and told her she was too shaky to feed herself.  They though maybe she was nervous after finding out COVID+.    PMH: Past Medical  History:  Diagnosis Date  . Anxiety   . Aphasia   . CHF (congestive heart failure) (HCC)   . Diabetes (HCC)   . Diabetic retinopathy (HCC)    NPDR OU  . Herpes virus disease   . Hyperlipemia   . Hypertension   . Insomnia   . Kidney disease   . Major depression, chronic   . Osteoarthritis   . Pneumonia   . Respiratory failure (HCC)    PSH: Past Surgical History:  Procedure Laterality Date  . BELOW KNEE LEG AMPUTATION    . CATARACT EXTRACTION    . EYE SURGERY      Past Medical History:  Diagnosis Date  . Anxiety   . Aphasia   . CHF (congestive heart failure) (HCC)   . Diabetes (HCC)   . Diabetic retinopathy (HCC)    NPDR OU  . Herpes virus disease   . Hyperlipemia   . Hypertension   . Insomnia   . Kidney disease   . Major depression, chronic   . Osteoarthritis   . Pneumonia   . Respiratory failure (HCC)     Medications:  I have reviewed the patient's current medications.  Facility-Administered Medications Prior to Admission  Medication Dose Route Frequency Provider Last Rate Last Dose  . triamcinolone  acetonide (TRIESENCE) 40 MG/ML subtenons injection 4 mg  4 mg Intravitreal  Rennis Chris, MD   4 mg at 05/30/18 1723   Medications Prior to Admission  Medication Sig Dispense Refill  . acetaminophen (TYLENOL) 325 MG tablet Take 650 mg by mouth 3 (three) times daily.     [name] Kitchen aluminum chloride (DRYSOL) 20 % external solution Apply 1 application topically See admin instructions. Apply to right stump at bedtime as needed for moisture    . aspirin 81 MG chewable tablet Chew 81 mg by mouth daily.    [name] Kitchen atorvastatin (LIPITOR) 10 MG tablet Take 10 mg by mouth at bedtime.     . brimonidine (ALPHAGAN) 0.2 % ophthalmic solution Place 1 drop into the left eye 2 (two) times a day.    . Cholecalciferol (VITAMIN D3) 1.25 MG (50000 UT) CAPS Take 50,000 Units by mouth every 30 (thirty) days.    . Insulin Glargine (LANTUS SOLOSTAR) 100 UNIT/ML Solostar Pen Inject 10 Units into the  skin daily.     [name] Kitchen loperamide (IMODIUM A-D) 2 MG tablet Take 2-4 mg by mouth See admin instructions. Take 4 mg by mouth as a first dose as needed for diarrhea/loose stools, then decrease to 2 mg with each subsequent loose stool    . metoprolol tartrate (LOPRESSOR) 25 MG tablet Take 25 mg by mouth 2 (two) times daily.     . polyethylene glycol (MIRALAX / GLYCOLAX) packet Take 17 g by mouth daily. MIX INTO 8 OUNCES OF FLUID OF CHOICE    . Polysaccharide Iron Complex (FERREX 150 PO) Take 150 mg by mouth daily.    . promethazine (PHENERGAN) 25 MG suppository Place 25 mg rectally as needed for nausea or vomiting.    . sodium chloride 0.9 % infusion Inject 60 mLs into the vein See admin instructions. 60 ml's/hr via IV continuously until 12/03/2018 @@ 23:59    . cefTRIAXone (ROCEPHIN) 1 g injection     . fluticasone (FLONASE) 50 MCG/ACT nasal spray Place 1 spray into both nostrils daily.     [name] Kitchen ketorolac (ACULAR) 0.5 % ophthalmic solution     . lidocaine (XYLOCAINE) 1 % (with preservative) injection     . mupirocin cream (BACTROBAN) 2 % Apply 2 application topically 2 (two) times daily.    . prednisoLONE acetate (PRED FORTE) 1 % ophthalmic suspension     . Vitamin D, Ergocalciferol, (DRISDOL) 50000 units CAPS capsule 50,000 Units every 30 days.      ALLERGIES:  No Known Allergies  FAM HX: History reviewed. No pertinent family history.  Social History:   reports that she has never smoked. She has never used smokeless tobacco. She reports that she does not drink alcohol or use drugs.  ROS: unable to obtain due to patient factors  Blood pressure 118/63, pulse 97, temperature (!) 100.5 F (38.1 C), resp. rate 18, height  (1.626 m), weight 109.6 kg, SpO2 93 %.  No direct physical exam performed by myself in light of COVID 19 mitigation procedures.  Reviewed physical exam findings of ED and admitting physician, discussed with Dr. Ronaldo Miyamoto and RN currently caring for her.  No s/s gross volume  overload.  Frail.    Results for orders placed or performed during the hospital encounter of 12/13/2018 (from the past 48 hour(s))  Lactic acid, plasma     Status: None   Collection Time: 12/07/2018  4:27 PM  Result Value Ref Range   Lactic Acid, Venous 1.3 0.5 - 1.9 mmol/L  Comment: Performed at [facility], [address] [place], [place] [address]  Blood Culture (routine x 2)     Status: None (Preliminary result)   Collection Time: 05-Dec-2018  4:45 PM  Result Value Ref Range   Specimen Description BLOOD RIGHT HAND    Special Requests      BOTTLES DRAWN AEROBIC AND ANAEROBIC Blood Culture adequate volume   Culture      NO GROWTH < 24 HOURS Performed at [facility], [address] [place], [place] [address]    Report Status PENDING   CBC WITH DIFFERENTIAL     Status: Abnormal   Collection Time: 05-Dec-2018  4:45 PM  Result Value Ref Range   WBC 8.7 4.0 - 10.5 K/uL   RBC 2.82 (L) 3.87 - 5.11 MIL/uL   Hemoglobin 8.1 (L) 12.0 - 15.0 g/dL   HCT 09.826.7 (L) 11.936.0 - 14.746.0 %   MCV 94.7 80.0 - 100.0 fL   MCH 28.7 26.0 - 34.0 pg   MCHC 30.3 30.0 - 36.0 g/dL   RDW 82.912.8 56.211.5 - 13.015.5 %   Platelets 185 150 - 400 K/uL   nRBC 0.0 0.0 - 0.2 %   Neutrophils Relative % 79 %   Neutro Abs 6.8 1.7 - 7.7 K/uL   Lymphocytes Relative 16 %   Lymphs Abs 1.4 0.7 - 4.0 K/uL   Monocytes Relative 4 %   Monocytes Absolute 0.4 0.1 - 1.0 K/uL   Eosinophils Relative 0 %   Eosinophils Absolute 0.0 0.0 - 0.5 K/uL   Basophils Relative 0 %   Basophils Absolute 0.0 0.0 - 0.1 K/uL   Immature Granulocytes 1 %   Abs Immature Granulocytes 0.11 (H) 0.00 - 0.07 K/uL    Comment: Performed at [facility], [address] [place], [place] [address]  Comprehensive metabolic panel     Status: Abnormal   Collection Time: 05-Dec-2018  4:45 PM  Result Value Ref Range   Sodium 142 135 - 145 mmol/L   Potassium 5.6 (H) 3.5 - 5.1 mmol/L   Chloride 117 (H) 98 - 111 mmol/L   CO2 13 (L) 22 - 32 mmol/L   Glucose, Bld  128 (H) 70 - 99 mg/dL   BUN 72 (H) 8 - 23 mg/dL   Creatinine, Ser 4.695.90 (H) 0.44 - 1.00 mg/dL   Calcium 7.9 (L) 8.9 - 10.3 mg/dL   Total Protein 6.5 6.5 - 8.1 g/dL   Albumin 2.7 (L) 3.5 - 5.0 g/dL   AST 35 15 - 41 U/L   ALT 18 0 - 44 U/L   Alkaline Phosphatase 183 (H) 38 - 126 U/L   Total Bilirubin 0.4 0.3 - 1.2 mg/dL   GFR calc non Af Amer 6 (L) >60 mL/min   GFR calc Af Amer 7 (L) >60 mL/min   Anion gap 12 5 - 15    Comment: Performed at [facility], [address] [place], [place] [address]  D-dimer, quantitative     Status: Abnormal   Collection Time: 05-Dec-2018  4:45 PM  Result Value Ref Range   D-Dimer, Quant 1.78 (H) 0.00 - 0.50 ug/mL-FEU    Comment: (NOTE) At the manufacturer cut-off of 0.50 ug/mL FEU, this assay has been documented to exclude PE with a sensitivity and negative predictive value of 97 to 99%.  At this time, this assay has not been approved by the FDA to exclude DVT/VTE. Results should be correlated with clinical presentation. Performed  at [facility], [address] [place], [place] [address]   Procalcitonin     Status: None   Collection Time: 12/06/18  4:45 PM  Result Value Ref Range   Procalcitonin 6.78 ng/mL    Comment:        Interpretation: PCT > 2 ng/mL: Systemic infection (sepsis) is likely, unless other causes are known. (NOTE)       Sepsis PCT Algorithm           Lower Respiratory Tract                                      Infection PCT Algorithm    ----------------------------     ----------------------------         PCT < 0.25 ng/mL                PCT < 0.10 ng/mL         Strongly encourage             Strongly discourage   discontinuation of antibiotics    initiation of antibiotics    ----------------------------     -----------------------------       PCT 0.25 - 0.50 ng/mL            PCT 0.10 - 0.25 ng/mL               OR       >80% decrease in PCT            Discourage initiation of                                             antibiotics      Encourage discontinuation           of antibiotics    ----------------------------     -----------------------------         PCT >= 0.50 ng/mL              PCT 0.26 - 0.50 ng/mL               AND       <80% decrease in PCT              Encourage initiation of                                             antibiotics       Encourage continuation           of antibiotics    ----------------------------     -----------------------------        PCT >= 0.50 ng/mL                  PCT > 0.50 ng/mL               AND         increase in PCT                  Strongly encourage  initiation of antibiotics    Strongly encourage escalation           of antibiotics                                     -----------------------------                                           PCT <= 0.25 ng/mL                                                 OR                                        > 80% decrease in PCT                                     Discontinue / Do not initiate                                             antibiotics Performed at [facility], [address] [place], [place] [address]   Lactate dehydrogenase     Status: Abnormal   Collection Time: 12/15/18  4:45 PM  Result Value Ref Range   LDH 328 (H) 98 - 192 U/L    Comment: Performed at [facility], [address], [place], [place] [address]  Ferritin     Status: Abnormal   Collection Time: 2018/12/15  4:45 PM  Result Value Ref Range   Ferritin 501 (H) 11 - 307 ng/mL    Comment: Performed at [facility], [address] [place], [place] [address]  Triglycerides     Status: None   Collection Time: 12/15/2018  4:45 PM  Result Value Ref Range   Triglycerides 119 <150 mg/dL    Comment: Performed at [facility], [address] [place], [place] [address]  Fibrinogen     Status: Abnormal   Collection Time: 2018/12/15  4:45 PM  Result Value Ref Range   Fibrinogen  591 (H) 210 - 475 mg/dL    Comment: Performed at [facility], [address], [place], [place] [address]  C-reactive protein     Status: Abnormal   Collection Time: Dec 15, 2018  4:45 PM  Result Value Ref Range   CRP 15.8 (H) <1.0 mg/dL    Comment: Performed at [facility], [address] [place], [place] [address]  Troponin I - Once     Status: Abnormal   Collection Time: 12-15-18  4:45 PM  Result Value Ref Range   Troponin I 0.11 (HH) <0.03 ng/mL    Comment: CRITICAL RESULT CALLED TO, READ BACK BY AND VERIFIED WITH: A OLEARY,RN 1743 12-15-2018 D BRADLEY Performed at [facility], [address], [place], [place] [address]   I-Stat Creatinine, ED (  not at [facility])     Status: Abnormal   Collection Time: 12/13/2018  4:53 PM  Result Value Ref Range   Creatinine, Ser 5.90 (H) 0.44 - 1.00 mg/dL  POCT I-Stat EG7     Status: Abnormal   Collection Time: 11/20/2018  4:54 PM  Result Value Ref Range   pH, Ven 7.277 7.250 - 7.430   pCO2, Ven 32.7 (L) 44.0 - 60.0 mmHg   pO2, Ven 37.0 32.0 - 45.0 mmHg   Bicarbonate 15.3 (L) 20.0 - 28.0 mmol/L   TCO2 16 (L) 22 - 32 mmol/L   O2 Saturation 65.0 %   Acid-base deficit 11.0 (H) 0.0 - 2.0 mmol/L   Sodium 144 135 - 145 mmol/L   Potassium 5.4 (H) 3.5 - 5.1 mmol/L   Calcium, Ion 1.08 (L) 1.15 - 1.40 mmol/L   HCT 24.0 (L) 36.0 - 46.0 %   Hemoglobin 8.2 (L) 12.0 - 15.0 g/dL   Patient temperature HIDE    Sample type VENOUS    Comment NOTIFIED PHYSICIAN   Blood Culture (routine x 2)     Status: None (Preliminary result)   Collection Time: 11/20/2018  5:10 PM  Result Value Ref Range   Specimen Description BLOOD LEFT HAND    Special Requests      BOTTLES DRAWN AEROBIC ONLY Blood Culture results may not be optimal due to an inadequate volume of blood received in culture bottles   Culture      NO GROWTH < 24 HOURS Performed at [facility], [address] [place], [place] [address]    Report Status PENDING   Lactic acid, plasma      Status: None   Collection Time: 12/04/2018  5:35 PM  Result Value Ref Range   Lactic Acid, Venous 1.1 0.5 - 1.9 mmol/L    Comment: Performed at [facility], [address] [place], [place] [address]  Urinalysis, Complete w Microscopic     Status: Abnormal   Collection Time: 11/23/2018  8:41 PM  Result Value Ref Range   Color, Urine YELLOW YELLOW   APPearance CLOUDY (A) CLEAR   Specific Gravity, Urine 1.014 1.005 - 1.030   pH 5.0 5.0 - 8.0   Glucose, UA NEGATIVE NEGATIVE mg/dL   Hgb urine dipstick SMALL (A) NEGATIVE   Bilirubin Urine NEGATIVE NEGATIVE   Ketones, ur NEGATIVE NEGATIVE mg/dL   Protein, ur >=829 (A) NEGATIVE mg/dL   Nitrite NEGATIVE NEGATIVE   Leukocytes,Ua NEGATIVE NEGATIVE   RBC / HPF 0-5 0 - 5 RBC/hpf   WBC, UA 6-10 0 - 5 WBC/hpf   Bacteria, UA RARE (A) NONE SEEN   Squamous Epithelial / LPF 0-5 0 - 5   Mucus PRESENT    Amorphous Crystal PRESENT     Comment: Performed at [facility], [address] [place], [place] [address]  ABO/Rh     Status: None   Collection Time: 11/17/2018  9:00 PM  Result Value Ref Range   ABO/RH(D)      O POS Performed at [facility], [address] [place], [place] [address]   CBC     Status: Abnormal   Collection Time: 11/25/2018  9:09 PM  Result Value Ref Range   WBC 7.3 4.0 - 10.5 K/uL   RBC 2.65 (L) 3.87 - 5.11 MIL/uL   Hemoglobin 7.5 (L) 12.0 - 15.0 g/dL   HCT 84.6 (L) 96.2 - 95.2 %   MCV 93.6 80.0 - 100.0  fL   MCH 28.3 26.0 - 34.0 pg   MCHC 30.2 30.0 - 36.0 g/dL   RDW 16.1 09.6 - 04.5 %   Platelets 161 150 - 400 K/uL   nRBC 0.0 0.0 - 0.2 %    Comment: Performed at [facility], [address], [place], [place] [address]  Magnesium     Status: None   Collection Time: 12/09/2018  9:09 PM  Result Value Ref Range   Magnesium 2.1 1.7 - 2.4 mg/dL    Comment: Performed at [facility], [address], [place], [place] [address]  Phosphorus     Status: Abnormal   Collection Time: 11/19/2018  9:09 PM   Result Value Ref Range   Phosphorus 4.7 (H) 2.5 - 4.6 mg/dL    Comment: Performed at [facility], [address] [place], [place] [address]  Basic metabolic panel     Status: Abnormal   Collection Time: 11/17/2018  9:09 PM  Result Value Ref Range   Sodium 145 135 - 145 mmol/L   Potassium 5.4 (H) 3.5 - 5.1 mmol/L   Chloride 119 (H) 98 - 111 mmol/L   CO2 16 (L) 22 - 32 mmol/L   Glucose, Bld 111 (H) 70 - 99 mg/dL   BUN 73 (H) 8 - 23 mg/dL   Creatinine, Ser 2.13 (H) 0.44 - 1.00 mg/dL   Calcium 7.5 (L) 8.9 - 10.3 mg/dL   GFR calc non Af Amer 6 (L) >60 mL/min   GFR calc Af Amer 7 (L) >60 mL/min   Anion gap 10 5 - 15    Comment: Performed at [facility], [address], [place], [place] [address]  Procalcitonin - Baseline     Status: None   Collection Time: 12/02/2018  9:09 PM  Result Value Ref Range   Procalcitonin 7.19 ng/mL    Comment:        Interpretation: PCT > 2 ng/mL: Systemic infection (sepsis) is likely, unless other causes are known. (NOTE)       Sepsis PCT Algorithm           Lower Respiratory Tract                                      Infection PCT Algorithm    ----------------------------     ----------------------------         PCT < 0.25 ng/mL                PCT < 0.10 ng/mL         Strongly encourage             Strongly discourage   discontinuation of antibiotics    initiation of antibiotics    ----------------------------     -----------------------------       PCT 0.25 - 0.50 ng/mL            PCT 0.10 - 0.25 ng/mL               OR       >80% decrease in PCT            Discourage initiation of  antibiotics      Encourage discontinuation           of antibiotics    ----------------------------     -----------------------------         PCT >= 0.50 ng/mL              PCT 0.26 - 0.50 ng/mL               AND       <80% decrease in PCT              Encourage initiation of                                              antibiotics       Encourage continuation           of antibiotics    ----------------------------     -----------------------------        PCT >= 0.50 ng/mL                  PCT > 0.50 ng/mL               AND         increase in PCT                  Strongly encourage                                      initiation of antibiotics    Strongly encourage escalation           of antibiotics                                     -----------------------------                                           PCT <= 0.25 ng/mL                                                 OR                                        > 80% decrease in PCT                                     Discontinue / Do not initiate                                             antibiotics Performed at [facility], [address], [place], [place] [address]   Glucose, capillary     Status: Abnormal   Collection Time: 12/08/2018  10:10 PM  Result Value Ref Range   Glucose-Capillary 102 (H) 70 - 99 mg/dL  Basic metabolic panel     Status: Abnormal   Collection Time: 12/01/18  4:38 AM  Result Value Ref Range   Sodium 143 135 - 145 mmol/L   Potassium 5.3 (H) 3.5 - 5.1 mmol/L   Chloride 121 (H) 98 - 111 mmol/L   CO2 14 (L) 22 - 32 mmol/L   Glucose, Bld 109 (H) 70 - 99 mg/dL   BUN 70 (H) 8 - 23 mg/dL   Creatinine, Ser 1.61 (H) 0.44 - 1.00 mg/dL   Calcium 7.5 (L) 8.9 - 10.3 mg/dL   GFR calc non Af Amer 7 (L) >60 mL/min   GFR calc Af Amer 8 (L) >60 mL/min   Anion gap 8 5 - 15    Comment: Performed at [facility], [address], [place], [place] [address]  CBC     Status: Abnormal   Collection Time: 12/01/18  4:38 AM  Result Value Ref Range   WBC 7.1 4.0 - 10.5 K/uL   RBC 2.69 (L) 3.87 - 5.11 MIL/uL   Hemoglobin 7.7 (L) 12.0 - 15.0 g/dL   HCT 54.0 (L) 98.1 - 19.1 %   MCV 92.2 80.0 - 100.0 fL   MCH 28.6 26.0 - 34.0 pg   MCHC 31.0 30.0 - 36.0 g/dL   RDW 47.8 29.5 - 62.1 %   Platelets 163 150 - 400 K/uL   nRBC  0.0 0.0 - 0.2 %    Comment: Performed at [facility], [address] [place], [place] [address]  D-dimer, quantitative (not at [facility])     Status: Abnormal   Collection Time: 12/01/18  4:38 AM  Result Value Ref Range   D-Dimer, Quant 2.31 (H) 0.00 - 0.50 ug/mL-FEU    Comment: (NOTE) At the manufacturer cut-off of 0.50 ug/mL FEU, this assay has been documented to exclude PE with a sensitivity and negative predictive value of 97 to 99%.  At this time, this assay has not been approved by the FDA to exclude DVT/VTE. Results should be correlated with clinical presentation. Performed at [facility], [address] [place], [place] [address]   Ferritin     Status: Abnormal   Collection Time: 12/01/18  4:38 AM  Result Value Ref Range   Ferritin 528 (H) 11 - 307 ng/mL    Comment: Performed at [facility], [address], [place], [place] [address]  Lactic acid, plasma     Status: None   Collection Time: 12/01/18  6:15 AM  Result Value Ref Range   Lactic Acid, Venous 0.9 0.5 - 1.9 mmol/L    Comment: Performed at [facility], [address] [place], [place] [address]  Glucose, capillary     Status: None   Collection Time: 12/01/18  7:50 AM  Result Value Ref Range   Glucose-Capillary 95 70 - 99 mg/dL  Basic metabolic panel     Status: Abnormal   Collection Time: 12/01/18  8:44 AM  Result Value Ref Range   Sodium 146 (H) 135 - 145 mmol/L   Potassium 5.4 (H) 3.5 - 5.1 mmol/L   Chloride 123 (H) 98 - 111 mmol/L   CO2 14 (L) 22 - 32 mmol/L   Glucose, Bld 109 (H) 70 - 99 mg/dL   BUN 68 (H) 8 - 23 mg/dL   Creatinine, Ser 4.40 (H) 0.44 - 1.00 mg/dL  Calcium 7.6 (L) 8.9 - 10.3 mg/dL   GFR calc non Af Amer 6 (L) >60 mL/min   GFR calc Af Amer 7 (L) >60 mL/min   Anion gap 9 5 - 15    Comment: Performed at [facility], [address] [place], [place] [address]  Glucose, capillary     Status: None   Collection Time: 12/01/18 11:25 AM  Result Value Ref Range    Glucose-Capillary 87 70 - 99 mg/dL    Dg Chest Port 1 View  Result Date: 12/15/2018 CLINICAL DATA:  Dyspnea, COVID-19 positive EXAM: PORTABLE CHEST 1 VIEW COMPARISON:  None. FINDINGS: Mild enlargement of the cardiopericardial silhouette. Aortic arch atherosclerosis. Otherwise normal mediastinal contour. No pneumothorax. No pleural effusion. Patchy hazy opacity throughout right greater than left lung. IMPRESSION: Patchy hazy opacity throughout the right greater than left lung, compatible with COVID-19 pneumonia. Mild enlargement of the cardiopericardial silhouette. Electronically Signed   By: Delbert Phenix M.D.   On: 11/20/2018 17:31   Dg Abd Portable 1v  Result Date: 12/14/2018 CLINICAL DATA:  Abdominal pain and distension. EXAM: PORTABLE ABDOMEN - 1 VIEW COMPARISON:  None. FINDINGS: Evidence of free air. No small bowel dilatation to suggest obstruction. Mild gaseous distention of colon. Stool distends the rectum. Multiple overlying monitoring devices. No radiopaque calculi. IMPRESSION: Nonobstructive bowel gas pattern with mild gaseous colonic distention, suspect a degree of colonic ileus. Mild rectal distention with stool. Electronically Signed   By: Narda Rutherford M.D.   On: 11/18/2018 23:44    Assessment/Plan 1.  Hypoxic respiratory failure secondary to COVID 19 +/- bacterial PNA:  Antibiotics per primary, dose vanc for renal function.  CXR without pulmonary edema but in oliguric state with severe AKI would be very judicious with IVF - she currently has NS 131mL/hr running which her RN will hold.  I don't see a need for diuresis at this point to help pulmonary status.   2.  Sepsis secondary to #1:  Care per above.    3.  AKI on CKD: unknown baseline but per staff at [facility] 10/2018 Cr 3.5, now with severe AKI in setting of #1 and 2 above.  Suspect ATN type picture in setting of #1 and 2 above.  UA with small blood (no RBC on microscopy) and 2+ proteinuria.  AKI is quite common with COVID 19  and proteinuria is a frequent feature, though she has underlying DM and could have assoc proteinuria at baseline.  Renal US deferred for CT to r/o obstruction.    Per above with oliguria hold IVF at this time, but can administer as clinical course dictates.   She has no indications for dialysis and is a marginal dialysis candidate at baseline.  Hopefully she will improve with supportive care. I have discussed at length with her daughter and she will consider options in case need for dialysis arises, however she favors a time limited trial of RRT should the need arise.  This will be an ongoing discussion.   4.  Hyperkalemia: mild, on lokelma 5 TID for now.  Check BMP now and titrate lokelma if needed.   5.  Non anion gap metabolic acidosis secondary to AKI:  With worsening hypoxia would like to be judicious with administration of IVF.  For now will rx na bicarb 1300 TID.  Ongoing assessment for need for IV bicarb either bolus or via bicarb gtt.     6.  AMS: sounds like delerium in setting of acute illness, supportive care +  care per above.   7.  Anemia:  Hb on presentation 8.1 > 7.7 now.  No indication for transfusion currently, CTM.  Check iron, B12, folate.  Consider ESA.   Tyler Pita 12/01/2018, 2:48 PM

## 2018-12-01 NOTE — Social Work (Signed)
CSW acknowledging consult for SNF placement. Pt is LTC resident at [facility].  Octavio Graves, MSW, [facility] Clinical Social Work [phone]

## 2018-12-02 ENCOUNTER — Inpatient Hospital Stay (HOSPITAL_COMMUNITY): Payer: 59

## 2018-12-02 DIAGNOSIS — J9601 Acute respiratory failure with hypoxia: Secondary | ICD-10-CM

## 2018-12-02 DIAGNOSIS — U071 COVID-19: Secondary | ICD-10-CM

## 2018-12-02 DIAGNOSIS — A419 Sepsis, unspecified organism: Secondary | ICD-10-CM

## 2018-12-02 DIAGNOSIS — J181 Lobar pneumonia, unspecified organism: Secondary | ICD-10-CM

## 2018-12-02 DIAGNOSIS — R509 Fever, unspecified: Secondary | ICD-10-CM

## 2018-12-02 DIAGNOSIS — R6520 Severe sepsis without septic shock: Secondary | ICD-10-CM

## 2018-12-02 LAB — BLOOD GAS, ARTERIAL
Acid-base deficit: 13.7 mmol/L — ABNORMAL HIGH (ref 0.0–2.0)
Bicarbonate: 12.4 mmol/L — ABNORMAL LOW (ref 20.0–28.0)
Drawn by: 535471
O2 Content: 1 L/min
O2 Saturation: 97.3 %
Patient temperature: 101
pCO2 arterial: 32.5 mmHg (ref 32.0–48.0)
pH, Arterial: 7.214 — ABNORMAL LOW (ref 7.350–7.450)
pO2, Arterial: 115 mmHg — ABNORMAL HIGH (ref 83.0–108.0)

## 2018-12-02 LAB — CBC WITH DIFFERENTIAL/PLATELET
Abs Immature Granulocytes: 0.1 10*3/uL — ABNORMAL HIGH (ref 0.00–0.07)
Basophils Absolute: 0 10*3/uL (ref 0.0–0.1)
Basophils Relative: 0 %
Eosinophils Absolute: 0.1 10*3/uL (ref 0.0–0.5)
Eosinophils Relative: 1 %
HCT: 25.6 % — ABNORMAL LOW (ref 36.0–46.0)
Hemoglobin: 7.7 g/dL — ABNORMAL LOW (ref 12.0–15.0)
Immature Granulocytes: 1 %
Lymphocytes Relative: 17 %
Lymphs Abs: 1.3 10*3/uL (ref 0.7–4.0)
MCH: 28.1 pg (ref 26.0–34.0)
MCHC: 30.1 g/dL (ref 30.0–36.0)
MCV: 93.4 fL (ref 80.0–100.0)
Monocytes Absolute: 0.3 10*3/uL (ref 0.1–1.0)
Monocytes Relative: 4 %
Neutro Abs: 6.1 10*3/uL (ref 1.7–7.7)
Neutrophils Relative %: 77 %
Platelets: 159 10*3/uL (ref 150–400)
RBC: 2.74 MIL/uL — ABNORMAL LOW (ref 3.87–5.11)
RDW: 13.2 % (ref 11.5–15.5)
WBC: 7.8 10*3/uL (ref 4.0–10.5)
nRBC: 0 % (ref 0.0–0.2)

## 2018-12-02 LAB — IRON AND TIBC
Iron: 25 ug/dL — ABNORMAL LOW (ref 28–170)
Saturation Ratios: 12 % (ref 10.4–31.8)
TIBC: 204 ug/dL — ABNORMAL LOW (ref 250–450)
UIBC: 179 ug/dL

## 2018-12-02 LAB — POCT I-STAT 7, (LYTES, BLD GAS, ICA,H+H)
Acid-base deficit: 14 mmol/L — ABNORMAL HIGH (ref 0.0–2.0)
Acid-base deficit: 13 mmol/L — ABNORMAL HIGH (ref 0.0–2.0)
Bicarbonate: 12.7 mmol/L — ABNORMAL LOW (ref 20.0–28.0)
Bicarbonate: 12.7 mmol/L — ABNORMAL LOW (ref 20.0–28.0)
Calcium, Ion: 1.19 mmol/L (ref 1.15–1.40)
Calcium, Ion: 1.18 mmol/L (ref 1.15–1.40)
HCT: 18 % — ABNORMAL LOW (ref 36.0–46.0)
HCT: 16 % — ABNORMAL LOW (ref 36.0–46.0)
Hemoglobin: 6.1 g/dL — CL (ref 12.0–15.0)
Hemoglobin: 5.4 g/dL — CL (ref 12.0–15.0)
O2 Saturation: 99 %
O2 Saturation: 95 %
Patient temperature: 101.9
Patient temperature: 100.5
Potassium: 5.4 mmol/L — ABNORMAL HIGH (ref 3.5–5.1)
Potassium: 5.3 mmol/L — ABNORMAL HIGH (ref 3.5–5.1)
Sodium: 149 mmol/L — ABNORMAL HIGH (ref 135–145)
Sodium: 147 mmol/L — ABNORMAL HIGH (ref 135–145)
TCO2: 14 mmol/L — ABNORMAL LOW (ref 22–32)
TCO2: 14 mmol/L — ABNORMAL LOW (ref 22–32)
pCO2 arterial: 32.7 mmHg (ref 32.0–48.0)
pCO2 arterial: 30.3 mmHg — ABNORMAL LOW (ref 32.0–48.0)
pH, Arterial: 7.205 — ABNORMAL LOW (ref 7.350–7.450)
pH, Arterial: 7.236 — ABNORMAL LOW (ref 7.350–7.450)
pO2, Arterial: 152 mmHg — ABNORMAL HIGH (ref 83.0–108.0)
pO2, Arterial: 93 mmHg (ref 83.0–108.0)

## 2018-12-02 LAB — VANCOMYCIN, RANDOM: Vancomycin Rm: 19

## 2018-12-02 LAB — RENAL FUNCTION PANEL
Albumin: 2.4 g/dL — ABNORMAL LOW (ref 3.5–5.0)
Anion gap: 12 (ref 5–15)
BUN: 81 mg/dL — ABNORMAL HIGH (ref 8–23)
CO2: 14 mmol/L — ABNORMAL LOW (ref 22–32)
Calcium: 8.1 mg/dL — ABNORMAL LOW (ref 8.9–10.3)
Chloride: 124 mmol/L — ABNORMAL HIGH (ref 98–111)
Creatinine, Ser: 6.55 mg/dL — ABNORMAL HIGH (ref 0.44–1.00)
GFR calc Af Amer: 7 mL/min — ABNORMAL LOW (ref >60)
GFR calc non Af Amer: 6 mL/min — ABNORMAL LOW (ref >60)
Glucose, Bld: 127 mg/dL — ABNORMAL HIGH (ref 70–99)
Phosphorus: 5.5 mg/dL — ABNORMAL HIGH (ref 2.5–4.6)
Potassium: 5.5 mmol/L — ABNORMAL HIGH (ref 3.5–5.1)
Sodium: 150 mmol/L — ABNORMAL HIGH (ref 135–145)

## 2018-12-02 LAB — FERRITIN: Ferritin: 622 ng/mL — ABNORMAL HIGH (ref 11–307)

## 2018-12-02 LAB — PREPARE RBC (CROSSMATCH)

## 2018-12-02 LAB — FOLATE: Folate: 10.4 ng/mL (ref >5.9)

## 2018-12-02 LAB — GLUCOSE, CAPILLARY: Glucose-Capillary: 120 mg/dL — ABNORMAL HIGH (ref 70–99)

## 2018-12-02 LAB — MAGNESIUM: Magnesium: 2.2 mg/dL (ref 1.7–2.4)

## 2018-12-02 LAB — VITAMIN B12: Vitamin B-12: 2373 pg/mL — ABNORMAL HIGH (ref 180–914)

## 2018-12-02 LAB — D-DIMER, QUANTITATIVE: D-Dimer, Quant: 2.16 ug/mL-FEU — ABNORMAL HIGH (ref 0.00–0.50)

## 2018-12-02 MED ORDER — VITAL HIGH PROTEIN PO LIQD
1000.0000 mL | ORAL | Status: DC
  Administered 2018-12-02 – 2018-12-03 (×2): 1000 mL

## 2018-12-02 MED ORDER — HEPARIN SODIUM (PORCINE) 5000 UNIT/ML IJ SOLN
10000.0000 [IU] | Freq: Three times a day (TID) | INTRAMUSCULAR | Status: DC
  Administered 2018-12-02: 16:00:00 10000 [IU] via SUBCUTANEOUS
  Filled 2018-12-02: qty 2

## 2018-12-02 MED ORDER — MIDAZOLAM HCL 2 MG/2ML IJ SOLN
INTRAMUSCULAR | Status: AC
  Filled 2018-12-02: qty 4

## 2018-12-02 MED ORDER — FUROSEMIDE 10 MG/ML IJ SOLN
40.0000 mg | Freq: Once | INTRAMUSCULAR | Status: AC
  Administered 2018-12-02: 40 mg via INTRAVENOUS
  Filled 2018-12-02: qty 4

## 2018-12-02 MED ORDER — FENTANYL CITRATE (PF) 100 MCG/2ML IJ SOLN
100.0000 ug | Freq: Once | INTRAMUSCULAR | Status: AC
  Administered 2018-12-02: 100 ug via INTRAVENOUS

## 2018-12-02 MED ORDER — ROCURONIUM BROMIDE 50 MG/5ML IV SOLN
20.0000 mg | Freq: Once | INTRAVENOUS | Status: AC
  Administered 2018-12-02: 20 mg via INTRAVENOUS
  Filled 2018-12-02: qty 2

## 2018-12-02 MED ORDER — ETOMIDATE 2 MG/ML IV SOLN
20.0000 mg | Freq: Once | INTRAVENOUS | Status: AC
  Administered 2018-12-02: 11:00:00 40 mg via INTRAVENOUS

## 2018-12-02 MED ORDER — FENTANYL CITRATE (PF) 100 MCG/2ML IJ SOLN
INTRAMUSCULAR | Status: AC
  Filled 2018-12-02: qty 2

## 2018-12-02 MED ORDER — LORAZEPAM 2 MG/ML IJ SOLN
0.5000 mg | Freq: Once | INTRAMUSCULAR | Status: AC
  Administered 2018-12-02: 0.5 mg via INTRAVENOUS
  Filled 2018-12-02: qty 1

## 2018-12-02 MED ORDER — MIDAZOLAM BOLUS VIA INFUSION
2.0000 mg | Freq: Once | INTRAVENOUS | Status: DC
  Administered 2018-12-02: 2 mg via INTRAVENOUS
  Filled 2018-12-02: qty 2

## 2018-12-02 MED ORDER — SODIUM BICARBONATE 8.4 % IV SOLN
INTRAVENOUS | Status: DC
  Administered 2018-12-02 – 2018-12-03 (×2): via INTRAVENOUS
  Filled 2018-12-02 (×7): qty 150

## 2018-12-02 MED ORDER — SODIUM BICARBONATE 8.4 % IV SOLN
INTRAVENOUS | Status: DC
  Filled 2018-12-02: qty 100

## 2018-12-02 MED ORDER — ETOMIDATE 2 MG/ML IV SOLN
INTRAVENOUS | Status: AC
  Filled 2018-12-02: qty 20

## 2018-12-02 MED ORDER — FREE WATER
200.0000 mL | Freq: Three times a day (TID) | Status: DC
  Administered 2018-12-02 – 2018-12-09 (×18): 200 mL

## 2018-12-02 MED ORDER — VANCOMYCIN HCL IN DEXTROSE 1-5 GM/200ML-% IV SOLN
1000.0000 mg | Freq: Once | INTRAVENOUS | Status: AC
  Administered 2018-12-02: 1000 mg via INTRAVENOUS
  Filled 2018-12-02: qty 200

## 2018-12-02 MED ORDER — ORAL CARE MOUTH RINSE
15.0000 mL | OROMUCOSAL | Status: DC
  Administered 2018-12-02 – 2018-12-10 (×77): 15 mL via OROMUCOSAL

## 2018-12-02 MED ORDER — CHLORHEXIDINE GLUCONATE 0.12% ORAL RINSE (MEDLINE KIT)
15.0000 mL | Freq: Two times a day (BID) | OROMUCOSAL | Status: DC
  Administered 2018-12-02 – 2018-12-10 (×15): 15 mL via OROMUCOSAL

## 2018-12-02 MED ORDER — MIDAZOLAM HCL 2 MG/2ML IJ SOLN
2.0000 mg | Freq: Once | INTRAMUSCULAR | Status: AC
  Administered 2018-12-02: 2 mg via INTRAVENOUS

## 2018-12-02 MED ORDER — PRO-STAT SUGAR FREE PO LIQD
30.0000 mL | Freq: Two times a day (BID) | ORAL | Status: DC
  Administered 2018-12-02 – 2018-12-04 (×4): 30 mL
  Filled 2018-12-02 (×4): qty 30

## 2018-12-02 MED ORDER — MIDAZOLAM HCL 2 MG/2ML IJ SOLN
INTRAMUSCULAR | Status: AC
  Filled 2018-12-02: qty 2

## 2018-12-02 MED ORDER — HALOPERIDOL LACTATE 5 MG/ML IJ SOLN
2.5000 mg | Freq: Once | INTRAMUSCULAR | Status: AC
  Administered 2018-12-02: 2.5 mg via INTRAVENOUS
  Filled 2018-12-02: qty 1

## 2018-12-02 MED ORDER — SODIUM CHLORIDE 0.45 % IV BOLUS
500.0000 mL | Freq: Once | INTRAVENOUS | Status: AC
  Administered 2018-12-02: 16:00:00 500 mL via INTRAVENOUS

## 2018-12-02 MED ORDER — DEXMEDETOMIDINE HCL IN NACL 400 MCG/100ML IV SOLN
0.2000 ug/kg/h | INTRAVENOUS | Status: AC
  Administered 2018-12-02: 0.4 ug/kg/h via INTRAVENOUS
  Administered 2018-12-02: 0.5 ug/kg/h via INTRAVENOUS
  Administered 2018-12-03: 10:00:00 0.4 ug/kg/h via INTRAVENOUS
  Administered 2018-12-03: 02:00:00 0.5 ug/kg/h via INTRAVENOUS
  Filled 2018-12-02 (×4): qty 100

## 2018-12-02 MED ORDER — PANTOPRAZOLE SODIUM 40 MG IV SOLR
40.0000 mg | Freq: Two times a day (BID) | INTRAVENOUS | Status: DC
  Administered 2018-12-02 – 2018-12-10 (×16): 40 mg via INTRAVENOUS
  Filled 2018-12-02 (×17): qty 40

## 2018-12-02 MED ORDER — MIDAZOLAM HCL 2 MG/2ML IJ SOLN
INTRAMUSCULAR | Status: AC
  Administered 2018-12-02: 12:00:00 2 mg
  Filled 2018-12-02: qty 2

## 2018-12-02 MED ORDER — CHLORHEXIDINE GLUCONATE CLOTH 2 % EX PADS
6.0000 | MEDICATED_PAD | Freq: Every day | CUTANEOUS | Status: DC
  Administered 2018-12-03 – 2018-12-10 (×8): 6 via TOPICAL

## 2018-12-02 MED ORDER — STERILE WATER FOR INJECTION IJ SOLN
INTRAMUSCULAR | Status: AC
  Filled 2018-12-02: qty 10

## 2018-12-02 MED ORDER — NOREPINEPHRINE 4 MG/250ML-% IV SOLN
0.0000 ug/min | INTRAVENOUS | Status: DC
  Administered 2018-12-02: 2 ug/min via INTRAVENOUS
  Filled 2018-12-02: qty 250

## 2018-12-02 MED ORDER — SODIUM CHLORIDE 0.9% IV SOLUTION
Freq: Once | INTRAVENOUS | Status: AC
  Administered 2018-12-02: 16:00:00 via INTRAVENOUS

## 2018-12-02 NOTE — Progress Notes (Signed)
RT NOTE: RT obtained ABG and reported critical value to Wynona Neat, MD. Hb 5.4. ABG as follows: pH 7.23, CO2 30.3, PO2 93, HCO3 12.7. No new orders for RT at this time. RT will continue to monitor.

## 2018-12-02 NOTE — Procedures (Signed)
Central Venous Catheter Insertion Procedure Note Julia Thomas [id number] 04/13/1941  Procedure: Insertion of Central Venous Catheter Indications: Assessment of intravascular volume and Drug and/or fluid administration  Procedure Details Consent: Risks of procedure as well as the alternatives and risks of each were explained to the (patient/caregiver).  Consent for procedure obtained. Time Out: Verified patient identification, verified procedure, site/side was marked, verified correct patient position, special equipment/implants available, medications/allergies/relevent history reviewed, required imaging and test results available.  Performed  Maximum sterile technique was used including antiseptics, cap, gloves, gown, hand hygiene, mask and sheet. Skin prep: Chlorhexidine; local anesthetic administered Julia antimicrobial bonded/coated triple lumen catheter was placed in the right internal jugular vein using the Seldinger technique.  Evaluation Blood flow good Complications: No apparent complications Patient did tolerate procedure well. Chest X-ray ordered to verify placement.  CXR: pending.  Julia Thomas Julia Thomas 12/02/2018, 12:13 PM

## 2018-12-02 NOTE — Consult Note (Signed)
NAME:  Julia Thomas, MRN:  [id number], DOB:  1940/12/05, LOS: 2 ADMISSION DATE:  12/13/2018, CONSULTATION DATE: 12/02/2018 REFERRING MD: Dr. Ronaldo Miyamoto, CHIEF COMPLAINT: Respiratory failure  Brief History   Patient in a nursing home Decreased p.o. intake Developed acute kidney injury COVID positive  History of present illness   Patient is a 78 year old African-American female, skilled nursing facility resident, with past medical history significant for recently positive test for COVID 19 3 days ago, chronic kidney disease with unknown baseline, diabetes mellitus, hypertension, CHF, dementia amongst other past medical and cardiac history.  Patient is unable to give any significant history.  Over the last several days, patient is said to have developed decreased p.o. intake.  BMP MP done at the skilled nursing facility revealed that the patient had developed acute kidney injury, hence, the decision to transfer patient to the hospital for further assessment and management.  On presentation to the hospital, BUN was noted to be 72, weight serum creatinine of 5.9, potassium of 5.6 and CO2 of 13.  Patient was saturating at 100% on 2 L supplemental oxygen.    Now requiring nonrebreather mask, more confused, agitated-transferred to ICU patient was admitted for acute kidney injury  Past Medical History   Past Medical History:  Diagnosis Date  . Anxiety   . Aphasia   . CHF (congestive heart failure) (HCC)   . Diabetes (HCC)   . Diabetic retinopathy (HCC)    NPDR OU  . Herpes virus disease   . Hyperlipemia   . Hypertension   . Insomnia   . Kidney disease   . Major depression, chronic   . Osteoarthritis   . Pneumonia   . Respiratory failure (HCC)    Significant Hospital Events   Increased oxygen requirement  Consults:  Renal 12/01/2018 PCCM 12/02/2018  Procedures:    Significant Diagnostic Tests:  Chest x-ray-multifocal infiltrate  Micro Data:  Blood cultures 5/14  Antimicrobials:   Cefepime 5/14>> Vancomycin 5/14>>   Interim history/subjective:  Progressive deterioration Increasing FiO2 requirement Persistent fever  Objective   Blood pressure (!) 115/94, pulse 92, temperature (!) 101 F (38.3 C), temperature source Rectal, resp. rate (!) 26, height  (1.626 m), weight 109.6 kg, SpO2 100 %.        Intake/Output Summary (Last 24 hours) at 12/02/2018 1022 Last data filed at 12/02/2018 0250 Gross per 24 hour  Intake 784.76 ml  Output 375 ml  Net 409.76 ml   Filed Weights   11/28/2018 1633 12/17/2018 2018  Weight: 90.7 kg 109.6 kg    Examination: General: Elderly, mild respiratory distress HENT: Dry oral mucosa Lungs: Occasional rhonchi Cardiovascular: S1-S2 appreciated Abdomen: Soft, bowel sounds appreciated Extremities: No clubbing, no edema Neuro: Confused, agitated GU: Oliguria  Resolved Hospital Problem list     Assessment & Plan:  COVID infection -Progressive decompensation with increasing oxygen requirement -Patient will transfer to the intensive care unit -Patient will be intubated -Ventilator management-vent settings per protocol  Acute hypoxemic respiratory failure -Multifactorial may be related to pneumonia -Possible healthcare associated pneumonia -Continue ventilator support  Multilobar pneumonia -Continue current antibiotics on vancomycin and cefepime -Follow cultures  Acute kidney injury on chronic kidney disease -Being followed by renal -No acute indication for dialysis at present -Continue to monitor closely  Hyperkalemia -Trend electrolytes -Avoid nephrotoxic  Hypernatremia -We will start on free water  Metabolic acidosis -Likely secondary to AKI/CKD  History of dementia -Supportive measures  Hypertension -Mo[place]oria Mclees  GERD discussed ongoing issues with patient's daughter  Progressive disease with acute decompensation At risk for requiring dialysis Risk for acute decompensation-already requiring  nonrebreather mask at present  Nutrition -Place OG tube -Start tube feeding per protocol  Best practice:  Diet: N.p.o. Pain/Anxiety/Delirium protocol (if indicated): As needed  VAP protocol (if indicated): In place DVT prophylaxis: Heparin GI prophylaxis: Protonix Glucose control: SSI Mobility: Bedrest Code Status: Full code Family Communication: Discussed with daughter Disposition: See you  Labs   CBC: Recent Labs  Lab 12/02/2018 1645 12/16/2018 1654 12/09/2018 2109 12/01/18 0438 12/02/18 0526  WBC 8.7  --  7.3 7.1 7.8  NEUTROABS 6.8  --   --   --  6.1  HGB 8.1* 8.2* 7.5* 7.7* 7.7*  HCT 26.7* 24.0* 24.8* 24.8* 25.6*  MCV 94.7  --  93.6 92.2 93.4  PLT 185  --  161 163 159    Basic Metabolic Panel: Recent Labs  Lab 11/28/2018 2109 12/01/18 0438 12/01/18 0844 12/01/18 1658 12/02/18 0526  NA 145 143 146* 146* 150*  K 5.4* 5.3* 5.4* 5.6* 5.5*  CL 119* 121* 123* 122* 124*  CO2 16* 14* 14* 13* 14*  GLUCOSE 111* 109* 109* 122* 127*  BUN 73* 70* 68* 72* 81*  CREATININE 5.90* 5.73* 6.04* 6.34* 6.55*  CALCIUM 7.5* 7.5* 7.6* 7.8* 8.1*  MG 2.1  --   --   --  2.2  PHOS 4.7*  --   --   --  5.5*   GFR: Estimated Creatinine Clearance: 8.8 mL/min (A) (by C-G formula based on SCr of 6.55 mg/dL (H)). Recent Labs  Lab 12/01/2018 1627 12/04/2018 1645 11/24/2018 1735 11/27/2018 2109 12/01/18 0438 12/01/18 0615 12/02/18 0526  PROCALCITON  --  6.78  --  7.19  --   --   --   WBC  --  8.7  --  7.3 7.1  --  7.8  LATICACIDVEN 1.3  --  1.1  --   --  0.9  --     Liver Function Tests: Recent Labs  Lab 12/09/2018 1645 12/02/18 0526  AST 35  --   ALT 18  --   ALKPHOS 183*  --   BILITOT 0.4  --   PROT 6.5  --   ALBUMIN 2.7* 2.4*   No results for input(s): LIPASE, AMYLASE in the last 168 hours. No results for input(s): AMMONIA in the last 168 hours.  ABG    Component Value Date/Time   PHART 7.214 (L) 12/02/2018 0855   PCO2ART 32.5 12/02/2018 0855   PO2ART 115 (H) 12/02/2018 0855    HCO3 12.4 (L) 12/02/2018 0855   TCO2 16 (L) 12/14/2018 1654   ACIDBASEDEF 13.7 (H) 12/02/2018 0855   O2SAT 97.3 12/02/2018 0855     Coagulation Profile: No results for input(s): INR, PROTIME in the last 168 hours.  Cardiac Enzymes: Recent Labs  Lab 12/08/2018 1645  TROPONINI 0.11*    HbA1C: No results found for: HGBA1C  CBG: Recent Labs  Lab 12/11/2018 2210 12/01/18 0750 12/01/18 1125 12/01/18 1602 12/01/18 2233  GLUCAP 102* 95 87 107* 110*    Review of Systems:   Patient is confused, unobtainable  Past Medical History  She,  has a past medical history of Anxiety, Aphasia, CHF (congestive heart failure) (HCC), Diabetes (HCC), Diabetic retinopathy (HCC), Herpes virus disease, Hyperlipemia, Hypertension, Insomnia, Kidney disease, Major depression, chronic, Osteoarthritis, Pneumonia, and Respiratory failure (HCC).   Surgical History    Past Surgical History:  Procedure Laterality Date  . BELOW KNEE LEG AMPUTATION    .  CATARACT EXTRACTION    . EYE SURGERY       Social History   reports that she has never smoked. She has never used smokeless tobacco. She reports that she does not drink alcohol or use drugs.   Family History   Her family history is not on file.   Allergies No Known Allergies   Critical care time: The patient is critically ill with multiple organ system failure and requires high complexity decision making for assessment and support, frequent evaluation and titration of therapies, advanced monitoring, review of radiographic studies and interpretation of complex data.    Critical Care Time devoted to patient care services, exclusive of separately billable procedures, described in this note is 40 minutes.

## 2018-12-02 NOTE — Progress Notes (Signed)
Pharmacy Antibiotic Note  Judyth Flaugher is a 78 y.o. female admitted on 11/25/2018 with HCAP/PNA, also COVID +.  Pharmacy has been consulted for cefepime/vancomycin dosing.  Renal function worsening and random vancomycin level post loading dose is < 20 mcg/mL.   SCr up 6.55 (unknown baseline), Tmax 101, WBC WNL.    CXR - worsening infiltrates.  CT - infection vs aspiration.   Plan: Vanc 1gm IV x 1 tonight, dose per level Cefepime 1gm IV Q24H Monitor renal fxn/plans, clinical progress, vanc level PRN   Height: 5\' 4"  (162.6 cm) Weight: 241 lb 10 oz (109.6 kg) IBW/kg (Calculated) : 54.7  Temp (24hrs), Avg:99.3 F (37.4 C), Min:97.4 F (36.3 C), Max:101.3 F (38.5 C)  Recent Labs  Lab 11/17/2018 1627 12/15/2018 1645  11/28/2018 1735 12/05/2018 2109 12/01/18 0438 12/01/18 0615 12/01/18 0844 12/01/18 1658 12/02/18 0526  WBC  --  8.7  --   --  7.3 7.1  --   --   --  7.8  CREATININE  --  5.90*   < >  --  5.90* 5.73*  --  6.04* 6.34* 6.55*  LATICACIDVEN 1.3  --   --  1.1  --   --  0.9  --   --   --   VANCORANDOM  --   --   --   --   --   --   --   --   --  19   < > = values in this interval not displayed.    Estimated Creatinine Clearance: 8.8 mL/min (A) (by C-G formula based on SCr of 6.55 mg/dL (H)).    No Known Allergies   Vanc 5/14 >> Cefepime 5/14 >>  5/14 Vanc 1750mg  IV x 1 at 1800 5/16 at 0530 VR = 19 mcg/mL >> redose with 1gm around 2000  5/14 BCx - NGTD 5/14 UCx - negative 5/11 COVID - positive   Tahjir Silveria D. Laney Potash, PharmD, BCPS, BCCCP 12/02/2018, 10:41 AM

## 2018-12-02 NOTE — Significant Event (Signed)
Rapid Response Event Note  Overview: Ongoing Respiratory Distress   Initial Focused Assessment: I went to see this patient this morning because patient was requiring a NRB on 15L for oxygenation. Per nurse, patient acutely became hypoxic last night and since than has required NRB. Upon arrival, patient was distress, increased WOB, + accessory muscle use, agitated/delirious/confused, 100 % saturation on NRB 15L, RR in the mid 30s, 101.1 rectal temperature, HR 100-110, stable BP. TRH MD was already at the bedside when I arrived.   Interventions:  -- STAT ABG - 7.21/32.5/115/13.7 and saturation of 97.3  Plan of Care: -- PCCM MD came to bedside, patient was seen, decision made to move to ICU for intubation.  -- 2W nurses transferred patient to ICU.   Event Summary:   Start Time: 0830 End Time: 0930  Julia Thomas R

## 2018-12-02 NOTE — Progress Notes (Signed)
Patient will be transferred to the intensive care unit  Discussed with the patient's daughter  Intubation Central line placement-consent obtained from her daughter

## 2018-12-02 NOTE — Procedures (Signed)
Endotracheal Intubation Procedure Note  Indication for endotracheal intubation: respiratory failure. Airway Assessment: Mallampati Class: II (hard and soft palate, upper portion of tonsils anduvula visible). Sedation: etomidate, fentanyl and midazolam.  100 mcg fentanyl, 40 mg etomidate, 2 mg midazolam,  Paralytic: rocuronium.  40 mg Lidocaine: no. Atropine: no. Equipment: Glide scope. Cricoid Pressure: no. Number of attempts: 1. ETT location confirmed by by auscultation and ETCO2 monitor.  Naleyah Ohlinger A Jeniyah Menor 12/02/2018

## 2018-12-02 NOTE — Progress Notes (Signed)
Patient's family updated. No concerns or questions at this time.

## 2018-12-02 NOTE — Progress Notes (Signed)
ABG noted for metabolic acidosis -Continue bicarb drip  Anemia with hematocrit continue to trend downwards  Stool noted to be dark-colored but no bright red blood noted  Hold tube feeds Order for transfusion of 2 units in place  Protonix 40 every 12  Hold heparin subcu   Source of bleeding is unclear however a GI bleed is possible with a uremia, no other bleeding noted  I will repeat chest x-ray Follow-up blood gas at 2000 hrs. H/H to be monitored following transfusion

## 2018-12-02 NOTE — Progress Notes (Signed)
[name] Kitchen  PROGRESS NOTE    Julia Thomas  NHA:[id number] DOB: [birth year] DOA: 11/20/2018 PCP: Jarome Matin, MD   Brief Narrative:   Patient is a 78 year old African-American female, skilled nursing facility resident, with past medical history significant for recently positive test for COVID 19 3 days ago, chronic kidney disease with unknown baseline, diabetes mellitus, hypertension, CHF, dementia amongst other past medical and cardiac history. Patient is unable to give any significant history. Over the last several days, patient is said to have developed decreased p.o. intake. BMP MP done at the skilled nursing facility revealed that the patient had developed acute kidney injury, hence, the decision to transfer patient to the hospital for further assessment and management. On presentation to the hospital, BUN was noted to be 72, weight serum creatinine of 5.9, potassium of 5.6and CO2 of 13. Patient is saturating at 100% on 2 L supplemental oxygen. Patient be admitted for further assessment and management.   Assessment & Plan:   Active Problems:   AKI (acute kidney injury) (HCC)   Acute kidney injury on chronic kidney disease stage unknown:     - baseline renal function unknown, patient not able to tell at this time     - nephrology consulted; no HD at this point, will determin over next 24 - 48 hours; appreciate assistance     - renal number slightly up this AM; fluid balance last 24hr is +830cc  Hyperkalemia:     - Likely secondary to AKI/CKD     Kath Caul     - monitor  Metabolic acidosis:     - Likely secondary to AKI/CKD     - bicarb tablets per nephro  Positive COVID-19/coronavirus:     - Manage expectantly.     - Patient was on 2L [place] yesterday, but has been increased to NRB this morning     - she appears uncomfortable, though satting 100% at this time     - repeat CXR shows worsening b/l infiltrates     - consult PCCM, check ABG, consider upgrade to ICU  PNA  - hazy opacity noted on CXR c/w COVID     - continue vanc/cefepime w/ pharm dosing     -  Dementia:     - No behavioral problems noted so far.     - Continue to monitor closely.  Diabetes mellitus:     - SSI  Hypertension:     - metoprolol   DVT prophylaxis: heparin Code Status: FULL Family Communication: Dtr updated by phone   Disposition Plan: TBD   Consultants:   Neprhology  PCCM   Antimicrobials:   Vanc, cefepime    Subjective: Increasing O2  Objective: Vitals:   12/02/18 0430 12/02/18 0447 12/02/18 0800 12/02/18 0828  BP:  (!) 126/57 93/65 123/65  Pulse: 80 82 92 92  Resp: 19 (!) 24 (!) 24 (!) 28  Temp:  99.8 F (37.7 C) (!) 97.4 F (36.3 C) (!) 97.5 F (36.4 C)  TempSrc:  Rectal Rectal Rectal  SpO2: 100% 100% 100% 100%  Weight:      Height:        Intake/Output Summary (Last 24 hours) at 12/02/2018 0845 Last data filed at 12/02/2018 0250 Gross per 24 hour  Intake 1209.76 ml  Output 375 ml  Net 834.76 ml   Filed Weights   12/17/2018 1633 12/05/2018 2018  Weight: 90.7 kg 109.6 kg    Examination:  General: 78 y.o. female resting in bed in mild distress  Cardiovascular: tachy, +S1, S2, no m/g/r, equal pulses throughout Respiratory: b/l rhonchi, increased WOB GI: BS+, NDNT, no masses noted, no organomegaly noted MSK: No e/c/c Skin: No rashes, bruises, ulcerations noted Neuro: confused, following some commands, agitated    Data Reviewed: I have personally reviewed following labs and imaging studies.  CBC: Recent Labs  Lab 11/23/2018 1645 11/23/2018 1654 12/01/2018 2109 12/01/18 0438 12/02/18 0526  WBC 8.7  --  7.3 7.1 7.8  NEUTROABS 6.8  --   --   --  6.1  HGB 8.1* 8.2* 7.5* 7.7* 7.7*  HCT 26.7* 24.0* 24.8* 24.8* 25.6*  MCV 94.7  --  93.6 92.2 93.4  PLT 185  --  161 163 159   Basic Metabolic Panel: Recent Labs  Lab 12/07/2018 2109 12/01/18 0438 12/01/18 0844 12/01/18 1658 12/02/18 0526  NA 145 143 146* 146* 150*  K 5.4* 5.3*  5.4* 5.6* 5.5*  CL 119* 121* 123* 122* 124*  CO2 16* 14* 14* 13* 14*  GLUCOSE 111* 109* 109* 122* 127*  BUN 73* 70* 68* 72* 81*  CREATININE 5.90* 5.73* 6.04* 6.34* 6.55*  CALCIUM 7.5* 7.5* 7.6* 7.8* 8.1*  MG 2.1  --   --   --  2.2  PHOS 4.7*  --   --   --  5.5*   GFR: Estimated Creatinine Clearance: 8.8 mL/min (A) (by C-G formula based on SCr of 6.55 mg/dL (H)). Liver Function Tests: Recent Labs  Lab 12/03/2018 1645 12/02/18 0526  AST 35  --   ALT 18  --   ALKPHOS 183*  --   BILITOT 0.4  --   PROT 6.5  --   ALBUMIN 2.7* 2.4*   No results for input(s): LIPASE, AMYLASE in the last 168 hours. No results for input(s): AMMONIA in the last 168 hours. Coagulation Profile: No results for input(s): INR, PROTIME in the last 168 hours. Cardiac Enzymes: Recent Labs  Lab 12/06/2018 1645  TROPONINI 0.11*   BNP (last 3 results) No results for input(s): PROBNP in the last 8760 hours. HbA1C: No results for input(s): HGBA1C in the last 72 hours. CBG: Recent Labs  Lab 11/22/2018 2210 12/01/18 0750 12/01/18 1125 12/01/18 1602 12/01/18 2233  GLUCAP 102* 95 87 107* 110*   Lipid Profile: Recent Labs    12/05/2018 1645  TRIG 119   Thyroid Function Tests: No results for input(s): TSH, T4TOTAL, FREET4, T3FREE, THYROIDAB in the last 72 hours. Anemia Panel: Recent Labs    12/01/18 0438 12/02/18 0526  VITAMINB12  --  2,373*  FOLATE  --  10.4  FERRITIN 528* 622*  TIBC  --  204*  IRON  --  25*   Sepsis Labs: Recent Labs  Lab 11/20/2018 1627 12/05/2018 1645 12/17/2018 1735 12/04/2018 2109 12/01/18 0615  PROCALCITON  --  6.78  --  7.19  --   LATICACIDVEN 1.3  --  1.1  --  0.9    Recent Results (from the past 240 hour(s))  Blood Culture (routine x 2)     Status: None (Preliminary result)   Collection Time: 11/28/2018  4:45 PM  Result Value Ref Range Status   Specimen Description BLOOD RIGHT HAND  Final   Special Requests   Final    BOTTLES DRAWN AEROBIC AND ANAEROBIC Blood Culture  adequate volume   Culture   Final    NO GROWTH < 24 HOURS Performed at [facility], [address] [place], [place] [address]    Report Status PENDING  Incomplete  Blood Culture (routine x 2)     Status: None (Preliminary result)   Collection Time: November 03, 2018  5:10 PM  Result Value Ref Range Status   Specimen Description BLOOD LEFT HAND  Final   Special Requests   Final    BOTTLES DRAWN AEROBIC ONLY Blood Culture results may not be optimal due to an inadequate volume of blood received in culture bottles   Culture   Final    NO GROWTH < 24 HOURS Performed at [facility], [address] [place], [place] [address]    Report Status PENDING  Incomplete  Urine culture     Status: Abnormal   Collection Time: November 03, 2018  5:28 PM  Result Value Ref Range Status   Specimen Description URINE, CATHETERIZED  Final   Special Requests NONE  Final   Culture (A)  Final    <10,000 COLONIES/mL INSIGNIFICANT GROWTH Performed at [facility], [address] [place], [place] [address]    Report Status 12/01/2018 FINAL  Final         Radiology Studies: Dg Chest Port 1 View  Result Date: 12/02/2018 CLINICAL DATA:  Follow-up COVID-19 positive patient. EXAM: PORTABLE CHEST 1 VIEW COMPARISON:  Nov 30, 2018 FINDINGS: Increasing bilateral pulmonary infiltrates are identified, right greater than left, consistent with the patient's history. No pneumothorax. No change in the cardiomediastinal silhouette. IMPRESSION: Worsening bilateral infiltrates, right greater than left, consistent with the patient's history. Electronically Signed   By: Gerome Samavid  Williams III M.D   On: 12/02/2018 08:07   Dg Chest Port 1 View  Result Date: 10/18/18 CLINICAL DATA:  Dyspnea, COVID-19 positive EXAM: PORTABLE CHEST 1 VIEW COMPARISON:  None. FINDINGS: Mild enlargement of the cardiopericardial silhouette. Aortic arch atherosclerosis. Otherwise normal mediastinal contour. No pneumothorax. No pleural effusion.  Patchy hazy opacity throughout right greater than left lung. IMPRESSION: Patchy hazy opacity throughout the right greater than left lung, compatible with COVID-19 pneumonia. Mild enlargement of the cardiopericardial silhouette. Electronically Signed   By: Delbert PhenixJason A Poff M.D.   On: April 17, 202020 17:31   Dg Abd Portable 1v  Result Date: 10/18/18 CLINICAL DATA:  Abdominal pain and distension. EXAM: PORTABLE ABDOMEN - 1 VIEW COMPARISON:  None. FINDINGS: Evidence of free air. No small bowel dilatation to suggest obstruction. Mild gaseous distention of colon. Stool distends the rectum. Multiple overlying monitoring devices. No radiopaque calculi. IMPRESSION: Nonobstructive bowel gas pattern with mild gaseous colonic distention, suspect a degree of colonic ileus. Mild rectal distention with stool. Electronically Signed   By: Narda RutherfordMelanie  Sanford M.D.   On: April 17, 202020 23:44   Ct Renal Stone Study  Result Date: 12/01/2018 CLINICAL DATA:  AKI, rule out renal obstruction EXAM: CT ABDOMEN AND PELVIS WITHOUT CONTRAST TECHNIQUE: Multidetector CT imaging of the abdomen and pelvis was performed following the standard protocol without IV contrast. COMPARISON:  None. FINDINGS: Examination of the abdomen is somewhat limited by breath motion artifact throughout. Lower chest: Bibasilar ground-glass and heterogeneous airspace opacities and small bilateral pleural effusions. Coronary artery calcifications. Hepatobiliary: No focal liver abnormality is seen. No gallstones, gallbladder wall thickening, or biliary dilatation. Pancreas: Unremarkable. No pancreatic ductal dilatation or surrounding inflammatory changes. Spleen: Normal in size without focal abnormality. Adrenals/Urinary Tract: Adrenal glands are unremarkable. No evidence of renal calculus or hydronephrosis. There is a calcification in the vicinity of the mid left ureter, which appears to be within the left ovarian vein, reflecting a phlebolith. The urinary bladder is  decompressed by a Foley catheter. Stomach/Bowel: Stomach is within  normal limits. Appendix appears normal. No evidence of bowel wall thickening, distention, or inflammatory changes. Vascular/Lymphatic: Calcific atherosclerosis. No enlarged abdominal or pelvic lymph nodes. Reproductive: Uterine fibroids. Other: No abdominal wall hernia or abnormality. No abdominopelvic ascites. Musculoskeletal: No acute or significant osseous findings. IMPRESSION: 1. Examination of the abdomen is somewhat limited by breath motion artifact throughout. 2. No evidence of renal calculus or hydronephrosis. There is a calcification in the vicinity of the mid left ureter, which appears to be within the left ovarian vein, reflecting a phlebolith. The urinary bladder is decompressed by a Foley catheter. 3. Bibasilar ground-glass and heterogeneous airspace opacities and small bilateral pleural effusions, generally consistent with infection or aspiration. 4.  Other chronic and incidental findings as detailed above. Electronically Signed   By: Lauralyn Primes M.D.   On: 12/01/2018 21:18        Scheduled Meds:  heparin  5,000 Units Subcutaneous Q8H   insulin aspart  0-5 Units Subcutaneous QHS   insulin aspart  0-9 Units Subcutaneous TID WC   metoprolol tartrate  25 mg Oral BID   sodium bicarbonate  1,300 mg Oral TID   sodium zirconium cyclosilicate  10 g Oral BID   vancomycin variable dose per unstable renal function (pharmacist dosing)   Does not apply See admin instructions   Continuous Infusions:  ceFEPime (MAXIPIME) IV Stopped (12/01/18 2332)     LOS: 2 days    Time spent: 40 minutes spent in the coordination of care today including speaking w/ consultants and family.    Teddy Spike, DO [facility] Pager [phone]  If 7PM-7AM, please contact night-coverage [email or web address] Password [email or web address] 12/02/2018, 8:45 AM

## 2018-12-02 NOTE — Progress Notes (Signed)
[facility] Progress Note   Subjective:   Clinically worsening and moved to ICU this AM, requiring intubation. Hypotension noted. UOP 375 yesterday.   Objective Vitals:   12/02/18 1000 12/02/18 1100 12/02/18 1200 12/02/18 1300  BP:  109/63 (!) 85/50 (!) 111/53  Pulse: 92 93 84 94  Resp: (!) 31 (!) 26 (!) 28 (!) 26  Temp:   (!) 101.9 F (38.8 C)   TempSrc:   Oral   SpO2:  100% 100% 100%  Weight:      Height:       Physical Exam Intubated and sedated currently having TLC placed by PCCM.  Remainder of exam deferred in light of COVID19 mitigation strategies.   Reviewed MD physical exam noting no edema or rales, Dry MM  Additional Objective Labs: Basic Metabolic Panel: Recent Labs  Lab 12/26/2018 2109  12/01/18 0844 12/01/18 1658 12/02/18 0526  NA 145   < > 146* 146* 150*  K 5.4*   < > 5.4* 5.6* 5.5*  CL 119*   < > 123* 122* 124*  CO2 16*   < > 14* 13* 14*  GLUCOSE 111*   < > 109* 122* 127*  BUN 73*   < > 68* 72* 81*  CREATININE 5.90*   < > 6.04* 6.34* 6.55*  CALCIUM 7.5*   < > 7.6* 7.8* 8.1*  PHOS 4.7*  --   --   --  5.5*   < > = values in this interval not displayed.   Liver Function Tests: Recent Labs  Lab 12/26/2018 1645 12/02/18 0526  AST 35  --   ALT 18  --   ALKPHOS 183*  --   BILITOT 0.4  --   PROT 6.5  --   ALBUMIN 2.7* 2.4*   No results for input(s): LIPASE, AMYLASE in the last 168 hours. CBC: Recent Labs  Lab 2018-12-26 1645  12/26/18 2109 12/01/18 0438 12/02/18 0526  WBC 8.7  --  7.3 7.1 7.8  NEUTROABS 6.8  --   --   --  6.1  HGB 8.1*   < > 7.5* 7.7* 7.7*  HCT 26.7*   < > 24.8* 24.8* 25.6*  MCV 94.7  --  93.6 92.2 93.4  PLT 185  --  161 163 159   < > = values in this interval not displayed.   Blood Culture    Component Value Date/Time   SDES URINE, CATHETERIZED 2018-12-26 1728   SPECREQUEST NONE Dec 26, 2018 1728   CULT (A) December 26, 2018 1728    <10,000 COLONIES/mL INSIGNIFICANT GROWTH Performed at [facility],  [address], [place], [place] [address]    REPTSTATUS 12/01/2018 FINAL 12/26/2018 1728    Cardiac Enzymes: Recent Labs  Lab 26-Dec-2018 1645  TROPONINI 0.11*   CBG: Recent Labs  Lab 12/01/18 0750 12/01/18 1125 12/01/18 1602 12/01/18 2233 12/02/18 0803  GLUCAP 95 87 107* 110* 120*   Iron Studies:  Recent Labs    12/02/18 0526  IRON 25*  TIBC 204*  FERRITIN 622*   @lablastinr3 @ Studies/Results: Dg Chest Port 1 View  Result Date: 12/02/2018 CLINICAL DATA:  Follow-up COVID-19 positive patient. EXAM: PORTABLE CHEST 1 VIEW COMPARISON:  12-26-2018 FINDINGS: Increasing bilateral pulmonary infiltrates are identified, right greater than left, consistent with the patient's history. No pneumothorax. No change in the cardiomediastinal silhouette. IMPRESSION: Worsening bilateral infiltrates, right greater than left, consistent with the patient's history. Electronically Signed   By: Gerome Sam III M.D   On:  12/02/2018 08:07   Dg Chest Port 1 View  Result Date: Dec 16, 2018 CLINICAL DATA:  Dyspnea, COVID-19 positive EXAM: PORTABLE CHEST 1 VIEW COMPARISON:  None. FINDINGS: Mild enlargement of the cardiopericardial silhouette. Aortic arch atherosclerosis. Otherwise normal mediastinal contour. No pneumothorax. No pleural effusion. Patchy hazy opacity throughout right greater than left lung. IMPRESSION: Patchy hazy opacity throughout the right greater than left lung, compatible with COVID-19 pneumonia. Mild enlargement of the cardiopericardial silhouette. Electronically Signed   By: Delbert Phenix M.D.   On: Dec 16, 2018 17:31   Dg Abd Portable 1v  Result Date: 2018/12/16 CLINICAL DATA:  Abdominal pain and distension. EXAM: PORTABLE ABDOMEN - 1 VIEW COMPARISON:  None. FINDINGS: Evidence of free air. No small bowel dilatation to suggest obstruction. Mild gaseous distention of colon. Stool distends the rectum. Multiple overlying monitoring devices. No radiopaque calculi. IMPRESSION: Nonobstructive  bowel gas pattern with mild gaseous colonic distention, suspect a degree of colonic ileus. Mild rectal distention with stool. Electronically Signed   By: Narda Rutherford M.D.   On: Dec 16, 2018 23:44   Ct Renal Stone Study  Result Date: 12/01/2018 CLINICAL DATA:  AKI, rule out renal obstruction EXAM: CT ABDOMEN AND PELVIS WITHOUT CONTRAST TECHNIQUE: Multidetector CT imaging of the abdomen and pelvis was performed following the standard protocol without IV contrast. COMPARISON:  None. FINDINGS: Examination of the abdomen is somewhat limited by breath motion artifact throughout. Lower chest: Bibasilar ground-glass and heterogeneous airspace opacities and small bilateral pleural effusions. Coronary artery calcifications. Hepatobiliary: No focal liver abnormality is seen. No gallstones, gallbladder wall thickening, or biliary dilatation. Pancreas: Unremarkable. No pancreatic ductal dilatation or surrounding inflammatory changes. Spleen: Normal in size without focal abnormality. Adrenals/Urinary Tract: Adrenal glands are unremarkable. No evidence of renal calculus or hydronephrosis. There is a calcification in the vicinity of the mid left ureter, which appears to be within the left ovarian vein, reflecting a phlebolith. The urinary bladder is decompressed by a Foley catheter. Stomach/Bowel: Stomach is within normal limits. Appendix appears normal. No evidence of bowel wall thickening, distention, or inflammatory changes. Vascular/Lymphatic: Calcific atherosclerosis. No enlarged abdominal or pelvic lymph nodes. Reproductive: Uterine fibroids. Other: No abdominal wall hernia or abnormality. No abdominopelvic ascites. Musculoskeletal: No acute or significant osseous findings. IMPRESSION: 1. Examination of the abdomen is somewhat limited by breath motion artifact throughout. 2. No evidence of renal calculus or hydronephrosis. There is a calcification in the vicinity of the mid left ureter, which appears to be within the  left ovarian vein, reflecting a phlebolith. The urinary bladder is decompressed by a Foley catheter. 3. Bibasilar ground-glass and heterogeneous airspace opacities and small bilateral pleural effusions, generally consistent with infection or aspiration. 4.  Other chronic and incidental findings as detailed above. Electronically Signed   By: Lauralyn Primes M.D.   On: 12/01/2018 21:18   Medications: . ceFEPime (MAXIPIME) IV Stopped (12/01/18 2332)  . dexmedetomidine (PRECEDEX) IV infusion    . sodium chloride    . vancomycin     . feeding supplement (PRO-STAT SUGAR FREE 64)  30 mL Per Tube BID  . feeding supplement (VITAL HIGH PROTEIN)  1,000 mL Per Tube Q24H  . fentaNYL      . free water  200 mL Per Tube Q8H  . heparin  10,000 Units Subcutaneous Q8H  . insulin aspart  0-5 Units Subcutaneous QHS  . insulin aspart  0-9 Units Subcutaneous TID WC  . metoprolol tartrate  25 mg Oral BID  . midazolam      . midazolam  2 mg Intravenous Once  . sodium bicarbonate  1,300 mg Oral TID  . sodium zirconium cyclosilicate  10 g Oral BID  . vancomycin variable dose per unstable renal function (pharmacist dosing)   Does not apply See admin instructions    Assessment/Plan: 1.  Hypoxic respiratory failure secondary to COVID 19 +/- bacterial PNA:  Clinically worsening and now intubated.  HCAP antibiotics per primary, dose vanc for renal function.  CXR with worsening infiltrates but without pulmonary edema but in oliguric state with severe AKI would be very judicious with IVF.  I don't see a need for diuresis at this point to help pulmonary status.   2.  Sepsis secondary to #1:  Care per above.    3.  AKI on CKD: unknown baseline [facility], now with severe AKI in setting of #1 and 2 above.  Suspect ATN type picture in setting of #1 and 2 above.  UA with small blood (no RBC on microscopy) and 2+ proteinuria.  AKI is quite common with COVID 19 and proteinuria is a frequent feature,  though she has underlying DM and could have assoc proteinuria at baseline.  CT without obstruction.   Per above with oliguria hold IVF at this time, but can administer as clinical course dictates.   She has no indications for dialysis and is a marginal dialysis candidate at baseline. I suspect in the next 24h she will develop indications for RRT but currently has none.  Will continue to monitor closely.   4.  Hyperkalemia: mild, on lokelma 10 BID for now.  K remains in the mid 5s, tolerable.  5.  Non anion gap metabolic acidosis secondary to AKI:  With worsening hypoxia would like to be judicious with administration of IVF.  For now will rx na bicarb 1300 TID.  PH this morning 7.2 which is acceptable.  Ongoing assessment for need for IV bicarb either bolus or via bicarb gtt.     6.  Hypernatremia:  Na 150 this AM.  Primary initiated FWF. CTM.   7.  Anemia:  Hb on presentation 8.1 > 7.7 now.  No indication for transfusion currently, CTM.  Check iron, B12, folate.  Consider ESA.   Julia BakesLindsay Kaysey Berndt MD 12/02/2018, 1:42 PM  [facility] Pager: [phone]

## 2018-12-02 NOTE — Progress Notes (Signed)
RT NOTE: RT pulled back ETT 1cm per MD order. ETT now at 23cm at the lip. RT will continue to monitor.

## 2018-12-02 NOTE — Progress Notes (Signed)
Informed about patient's ABG Metabolic acidosis-likely related to acute kidney injury We will start on bicarb drip  Hemoglobin of 6.1 Transfuse 2 units and monitor May be GI blood loss Further evaluation when more stable

## 2018-12-02 NOTE — Procedures (Signed)
Intubation Procedure Note Julia Thomas [id number] 07-04-1941  Procedure: Intubation Indications: Acute hypoxemic respiratory failure  Procedure Details Consent: Risks of procedure as well as the alternatives and risks of each were explained to the (patient/caregiver).  Consent for procedure obtained. Time Out: Verified patient identification, verified procedure, site/side was marked, verified correct patient position, special equipment/implants available, medications/allergies/relevent history reviewed, required imaging and test results available.  Performed  Maximum sterile technique was used including cap, gloves, gown, hand hygiene and mask.  3    Evaluation Hemodynamic Status: BP stable throughout; O2 sats: stable throughout Patient's Current Condition: stable Complications: No apparent complications Patient did tolerate procedure well. Chest X-ray ordered to verify placement.  CXR: pending.   Julia Thomas Julia Thomas 12/02/2018

## 2018-12-02 NOTE — Progress Notes (Signed)
RT NOTE: RT obtained ABG and paged Olalere, MD to report critical value Hb 6.1. ABG as follows: pH 7.20, CO2 32.7, PO2 152 and HCO3 12.7. RT and RN waiting for MD to call RT for new orders. RT will continue to monitor.

## 2018-12-03 ENCOUNTER — Inpatient Hospital Stay (HOSPITAL_COMMUNITY): Payer: 59

## 2018-12-03 DIAGNOSIS — Z452 Encounter for adjustment and management of vascular access device: Secondary | ICD-10-CM

## 2018-12-03 DIAGNOSIS — N179 Acute kidney failure, unspecified: Secondary | ICD-10-CM

## 2018-12-03 DIAGNOSIS — G9340 Encephalopathy, unspecified: Secondary | ICD-10-CM

## 2018-12-03 LAB — CBC
HCT: 29.8 % — ABNORMAL LOW (ref 36.0–46.0)
Hemoglobin: 9.6 g/dL — ABNORMAL LOW (ref 12.0–15.0)
MCH: 28.2 pg (ref 26.0–34.0)
MCHC: 32.2 g/dL (ref 30.0–36.0)
MCV: 87.4 fL (ref 80.0–100.0)
Platelets: 144 10*3/uL — ABNORMAL LOW (ref 150–400)
RBC: 3.41 MIL/uL — ABNORMAL LOW (ref 3.87–5.11)
RDW: 15.5 % (ref 11.5–15.5)
WBC: 6 10*3/uL (ref 4.0–10.5)
nRBC: 0 % (ref 0.0–0.2)

## 2018-12-03 LAB — DIFFERENTIAL
Abs Immature Granulocytes: 0.07 10*3/uL (ref 0.00–0.07)
Basophils Absolute: 0 10*3/uL (ref 0.0–0.1)
Basophils Relative: 0 %
Eosinophils Absolute: 0.1 10*3/uL (ref 0.0–0.5)
Eosinophils Relative: 2 %
Immature Granulocytes: 1 %
Lymphocytes Relative: 15 %
Lymphs Abs: 0.9 10*3/uL (ref 0.7–4.0)
Monocytes Absolute: 0.2 10*3/uL (ref 0.1–1.0)
Monocytes Relative: 3 %
Neutro Abs: 4.7 10*3/uL (ref 1.7–7.7)
Neutrophils Relative %: 79 %

## 2018-12-03 LAB — TYPE AND SCREEN
ABO/RH(D): O POS
Antibody Screen: NEGATIVE
Unit division: 0
Unit division: 0

## 2018-12-03 LAB — POCT I-STAT 7, (LYTES, BLD GAS, ICA,H+H)
Acid-base deficit: 11 mmol/L — ABNORMAL HIGH (ref 0.0–2.0)
Bicarbonate: 15.2 mmol/L — ABNORMAL LOW (ref 20.0–28.0)
Calcium, Ion: 1.12 mmol/L — ABNORMAL LOW (ref 1.15–1.40)
HCT: 25 % — ABNORMAL LOW (ref 36.0–46.0)
Hemoglobin: 8.5 g/dL — ABNORMAL LOW (ref 12.0–15.0)
O2 Saturation: 43 %
Patient temperature: 98.5
Potassium: 5 mmol/L (ref 3.5–5.1)
Sodium: 146 mmol/L — ABNORMAL HIGH (ref 135–145)
TCO2: 16 mmol/L — ABNORMAL LOW (ref 22–32)
pCO2 arterial: 34.1 mmHg (ref 32.0–48.0)
pH, Arterial: 7.257 — ABNORMAL LOW (ref 7.350–7.450)
pO2, Arterial: 27 mmHg — CL (ref 83.0–108.0)

## 2018-12-03 LAB — GLUCOSE, CAPILLARY
Glucose-Capillary: 108 mg/dL — ABNORMAL HIGH (ref 70–99)
Glucose-Capillary: 132 mg/dL — ABNORMAL HIGH (ref 70–99)
Glucose-Capillary: 143 mg/dL — ABNORMAL HIGH (ref 70–99)
Glucose-Capillary: 159 mg/dL — ABNORMAL HIGH (ref 70–99)
Glucose-Capillary: 174 mg/dL — ABNORMAL HIGH (ref 70–99)
Glucose-Capillary: 226 mg/dL — ABNORMAL HIGH (ref 70–99)
Glucose-Capillary: 227 mg/dL — ABNORMAL HIGH (ref 70–99)
Glucose-Capillary: 238 mg/dL — ABNORMAL HIGH (ref 70–99)
Glucose-Capillary: 240 mg/dL — ABNORMAL HIGH (ref 70–99)

## 2018-12-03 LAB — BASIC METABOLIC PANEL
Anion gap: 12 (ref 5–15)
BUN: 82 mg/dL — ABNORMAL HIGH (ref 8–23)
CO2: 15 mmol/L — ABNORMAL LOW (ref 22–32)
Calcium: 7.4 mg/dL — ABNORMAL LOW (ref 8.9–10.3)
Chloride: 118 mmol/L — ABNORMAL HIGH (ref 98–111)
Creatinine, Ser: 6.62 mg/dL — ABNORMAL HIGH (ref 0.44–1.00)
GFR calc Af Amer: 6 mL/min — ABNORMAL LOW (ref >60)
GFR calc non Af Amer: 6 mL/min — ABNORMAL LOW (ref >60)
Glucose, Bld: 250 mg/dL — ABNORMAL HIGH (ref 70–99)
Potassium: 4.9 mmol/L (ref 3.5–5.1)
Sodium: 145 mmol/L (ref 135–145)

## 2018-12-03 LAB — PHOSPHORUS: Phosphorus: 5.3 mg/dL — ABNORMAL HIGH (ref 2.5–4.6)

## 2018-12-03 LAB — BPAM RBC
Blood Product Expiration Date: 202006062359
Blood Product Expiration Date: 202006132359
ISSUE DATE / TIME: 202005161807
ISSUE DATE / TIME: 202005162132
Unit Type and Rh: 5100
Unit Type and Rh: 5100

## 2018-12-03 LAB — LACTIC ACID, PLASMA: Lactic Acid, Venous: 0.9 mmol/L (ref 0.5–1.9)

## 2018-12-03 LAB — HEMOGLOBIN AND HEMATOCRIT, BLOOD
HCT: 30 % — ABNORMAL LOW (ref 36.0–46.0)
Hemoglobin: 9.7 g/dL — ABNORMAL LOW (ref 12.0–15.0)

## 2018-12-03 LAB — MAGNESIUM: Magnesium: 2.2 mg/dL (ref 1.7–2.4)

## 2018-12-03 LAB — D-DIMER, QUANTITATIVE: D-Dimer, Quant: 1.61 ug/mL-FEU — ABNORMAL HIGH (ref 0.00–0.50)

## 2018-12-03 LAB — MRSA PCR SCREENING: MRSA by PCR: POSITIVE — AB

## 2018-12-03 LAB — PROTIME-INR
INR: 1.1 (ref 0.8–1.2)
Prothrombin Time: 14.1 seconds (ref 11.4–15.2)

## 2018-12-03 LAB — FERRITIN: Ferritin: 898 ng/mL — ABNORMAL HIGH (ref 11–307)

## 2018-12-03 MED ORDER — FENTANYL CITRATE (PF) 100 MCG/2ML IJ SOLN
100.0000 ug | Freq: Once | INTRAMUSCULAR | Status: AC
  Administered 2018-12-03: 50 ug via INTRAVENOUS

## 2018-12-03 MED ORDER — INSULIN ASPART 100 UNIT/ML ~~LOC~~ SOLN
0.0000 [IU] | SUBCUTANEOUS | Status: DC
  Administered 2018-12-03: 08:00:00 2 [IU] via SUBCUTANEOUS
  Administered 2018-12-03 (×2): 3 [IU] via SUBCUTANEOUS

## 2018-12-03 MED ORDER — PRISMASOL BGK 0/2.5 32-2.5 MEQ/L IV SOLN
INTRAVENOUS | Status: DC
  Filled 2018-12-03 (×4): qty 5000

## 2018-12-03 MED ORDER — DEXMEDETOMIDINE HCL IN NACL 400 MCG/100ML IV SOLN
0.2000 ug/kg/h | INTRAVENOUS | Status: DC
  Administered 2018-12-03 (×2): 0.7 ug/kg/h via INTRAVENOUS
  Administered 2018-12-04: 09:00:00 0.701 ug/kg/h via INTRAVENOUS
  Administered 2018-12-04: 04:00:00 0.7 ug/kg/h via INTRAVENOUS
  Filled 2018-12-03 (×4): qty 100

## 2018-12-03 MED ORDER — INSULIN ASPART 100 UNIT/ML ~~LOC~~ SOLN
2.0000 [IU] | SUBCUTANEOUS | Status: DC
  Administered 2018-12-03 – 2018-12-10 (×44): 2 [IU] via SUBCUTANEOUS

## 2018-12-03 MED ORDER — MUPIROCIN 2 % EX OINT
1.0000 "application " | TOPICAL_OINTMENT | Freq: Two times a day (BID) | CUTANEOUS | Status: AC
  Administered 2018-12-03 – 2018-12-07 (×10): 1 via NASAL
  Filled 2018-12-03 (×2): qty 22

## 2018-12-03 MED ORDER — HEPARIN SODIUM (PORCINE) 1000 UNIT/ML DIALYSIS
1000.0000 [IU] | INTRAMUSCULAR | Status: DC | PRN
  Administered 2018-12-03: 12:00:00 3000 [IU] via INTRAVENOUS_CENTRAL
  Filled 2018-12-03: qty 6
  Filled 2018-12-03: qty 3
  Filled 2018-12-03: qty 6

## 2018-12-03 MED ORDER — HEPARIN BOLUS VIA INFUSION (CRRT)
1000.0000 [IU] | INTRAVENOUS | Status: DC | PRN
  Filled 2018-12-03: qty 1000

## 2018-12-03 MED ORDER — PRISMASOL BGK 4/2.5 32-4-2.5 MEQ/L IV SOLN
INTRAVENOUS | Status: DC
  Administered 2018-12-03 – 2018-12-09 (×35): via INTRAVENOUS_CENTRAL
  Filled 2018-12-03 (×6): qty 5000

## 2018-12-03 MED ORDER — PRISMASOL BGK 4/2.5 32-4-2.5 MEQ/L REPLACEMENT SOLN
Status: DC
  Administered 2018-12-03 – 2018-12-09 (×6): via INTRAVENOUS_CENTRAL
  Filled 2018-12-03: qty 5000

## 2018-12-03 MED ORDER — SODIUM CHLORIDE 0.9 % IV SOLN
250.0000 [IU]/h | INTRAVENOUS | Status: DC
  Administered 2018-12-03 – 2018-12-06 (×3): 500 [IU]/h via INTRAVENOUS_CENTRAL
  Filled 2018-12-03 (×2): qty 2
  Filled 2018-12-03: qty 10000
  Filled 2018-12-03 (×2): qty 2

## 2018-12-03 MED ORDER — FENTANYL CITRATE (PF) 100 MCG/2ML IJ SOLN
INTRAMUSCULAR | Status: AC
  Filled 2018-12-03: qty 2

## 2018-12-03 MED ORDER — PRISMASOL BGK 4/2.5 32-4-2.5 MEQ/L REPLACEMENT SOLN
Status: DC
  Administered 2018-12-03 – 2018-12-09 (×5): via INTRAVENOUS_CENTRAL
  Filled 2018-12-03: qty 5000

## 2018-12-03 MED ORDER — SODIUM CHLORIDE 0.9 % IV SOLN
2.0000 g | Freq: Two times a day (BID) | INTRAVENOUS | Status: AC
  Administered 2018-12-03 – 2018-12-06 (×7): 2 g via INTRAVENOUS
  Filled 2018-12-03 (×9): qty 2

## 2018-12-03 MED ORDER — INSULIN ASPART 100 UNIT/ML ~~LOC~~ SOLN
3.0000 [IU] | SUBCUTANEOUS | Status: DC
  Administered 2018-12-03: 6 [IU] via SUBCUTANEOUS
  Administered 2018-12-03: 9 [IU] via SUBCUTANEOUS
  Administered 2018-12-03: 20:00:00 6 [IU] via SUBCUTANEOUS
  Administered 2018-12-03: 16:00:00 3 [IU] via SUBCUTANEOUS
  Administered 2018-12-04: 23:00:00 6 [IU] via SUBCUTANEOUS
  Administered 2018-12-04: 9 [IU] via SUBCUTANEOUS
  Administered 2018-12-04: 20:00:00 6 [IU] via SUBCUTANEOUS
  Administered 2018-12-04 (×3): 9 [IU] via SUBCUTANEOUS
  Administered 2018-12-05 (×2): 3 [IU] via SUBCUTANEOUS
  Administered 2018-12-05 – 2018-12-06 (×9): 6 [IU] via SUBCUTANEOUS
  Administered 2018-12-06: 9 [IU] via SUBCUTANEOUS
  Administered 2018-12-07 (×3): 6 [IU] via SUBCUTANEOUS
  Administered 2018-12-07: 08:00:00 9 [IU] via SUBCUTANEOUS
  Administered 2018-12-07: 16:00:00 6 [IU] via SUBCUTANEOUS
  Administered 2018-12-08 (×2): 9 [IU] via SUBCUTANEOUS
  Administered 2018-12-08 – 2018-12-09 (×8): 6 [IU] via SUBCUTANEOUS
  Administered 2018-12-09 – 2018-12-10 (×5): 9 [IU] via SUBCUTANEOUS
  Administered 2018-12-10: 16:00:00 6 [IU] via SUBCUTANEOUS
  Administered 2018-12-10: 08:00:00 9 [IU] via SUBCUTANEOUS

## 2018-12-03 MED ORDER — TOCILIZUMAB 400 MG/20ML IV SOLN
800.0000 mg | Freq: Once | INTRAVENOUS | Status: AC
  Administered 2018-12-03: 800 mg via INTRAVENOUS
  Filled 2018-12-03: qty 40

## 2018-12-03 MED ORDER — VANCOMYCIN HCL IN DEXTROSE 1-5 GM/200ML-% IV SOLN
1000.0000 mg | INTRAVENOUS | Status: AC
  Administered 2018-12-03 – 2018-12-06 (×4): 1000 mg via INTRAVENOUS
  Filled 2018-12-03 (×6): qty 200

## 2018-12-03 MED ORDER — PRISMASOL BGK 0/2.5 32-2.5 MEQ/L IV SOLN
INTRAVENOUS | Status: DC
  Filled 2018-12-03: qty 5000

## 2018-12-03 MED ORDER — METHYLPREDNISOLONE SODIUM SUCC 125 MG IJ SOLR
60.0000 mg | Freq: Four times a day (QID) | INTRAMUSCULAR | Status: DC
  Administered 2018-12-03 – 2018-12-05 (×9): 60 mg via INTRAVENOUS
  Filled 2018-12-03 (×10): qty 2

## 2018-12-03 NOTE — Consult Note (Signed)
NAME:  Julia Thomas, MRN:  [id number], DOB:  1941-04-24, LOS: 3 ADMISSION DATE:  12-29-2018, CONSULTATION DATE: 12/02/2018 REFERRING MD: Dr. Ronaldo Miyamoto, CHIEF COMPLAINT: Respiratory failure  Brief History   Patient in a nursing home Decreased p.o. intake Developed acute kidney injury COVID positive  History of present illness   Patient is a 78 year old African-American female, skilled nursing facility resident, with past medical history significant for recently positive test for COVID 19 3 days ago, chronic kidney disease with unknown baseline, diabetes mellitus, hypertension, CHF, dementia amongst other past medical and cardiac history.  Patient is unable to give any significant history.  Over the last several days, patient is said to have developed decreased p.o. intake.  BMP MP done at the skilled nursing facility revealed that the patient had developed acute kidney injury, hence, the decision to transfer patient to the hospital for further assessment and management.  On presentation to the hospital, BUN was noted to be 72, weight serum creatinine of 5.9, potassium of 5.6 and CO2 of 13.  Patient was saturating at 100% on 2 L supplemental oxygen.    Now requiring nonrebreather mask, more confused, agitated-transferred to ICU patient was admitted for acute kidney injury  Past Medical History   Past Medical History:  Diagnosis Date  . Anxiety   . Aphasia   . CHF (congestive heart failure) (HCC)   . Diabetes (HCC)   . Diabetic retinopathy (HCC)    NPDR OU  . Herpes virus disease   . Hyperlipemia   . Hypertension   . Insomnia   . Kidney disease   . Major depression, chronic   . Osteoarthritis   . Pneumonia   . Respiratory failure (HCC)    Significant Hospital Events   Increased oxygen requirement  Consults:  Renal 12/01/2018 PCCM 12/02/2018  Procedures:    Significant Diagnostic Tests:  Chest x-ray-multifocal infiltrate  Micro Data:  Blood cultures 5/14  Antimicrobials:   Cefepime 5/14>> Vancomycin 5/14>>   Interim history/subjective:  GI bleeding overnight that appeared to be old blood, Hg is now stable post transfusion Worsening renal function  Objective   Blood pressure (!) 102/52, pulse 61, temperature 98.5 F (36.9 C), temperature source Oral, resp. rate (!) 25, height  (1.626 m), weight 111 kg, SpO2 100 %.    Vent Mode: PRVC FiO2 (%):  [60 %-100 %] 60 % Set Rate:  [16 bmp] 16 bmp Vt Set:  [430 mL] 430 mL PEEP:  [6 cmH20-10 cmH20] 10 cmH20   Intake/Output Summary (Last 24 hours) at 12/03/2018 0959 Last data filed at 12/03/2018 0600 Gross per 24 hour  Intake 3143.51 ml  Output 625 ml  Net 2518.51 ml   Filed Weights   2018-12-29 1633 12-29-18 2018 12/03/18 0354  Weight: 90.7 kg 109.6 kg 111 kg    Examination: General: Acute on chronically ill appearing female, NAD, sedate on vent HENT: [place]/AT, PERRL, EOM-I and MMM, ETT in place Lungs: Soft, NT, ND and +BS Cardiovascular: RRR, Nl S1/S2 and -M/R/G Abdomen: Soft, NT, ND and +BS Extremities: Right lower ext amputation, 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 Neuro: Sedate, not following commands GU: Oliguria  I reviewed CXR myself, pulmonary edema noted and ETT is in a good position  Resolved Hospital Problem list     Assessment & Plan:  COVID infection - Solumedrol 60 mg IV q6 - CRP 15.8 - Holding high dose SQ heparin given concern for GI bleeding - Transfer to GVC once HD catheter is placed  Acute hypoxemic  respiratory failure - Continue full vent support - Adjust vent for ABG - Titrate O2 for sat of 88-92% - VAP prevention - CXR and ABG in AM  Multilobar pneumonia - Continue current antibiotics on vancomycin and cefepime - Follow cultures  Acute kidney injury on chronic kidney disease - Place HD catheter - Begin CRRT today - BMET in AM - Replace electrolytes - Once CRRT is started, d/c bicarb drip  Hypotension: - Levophed for BP support - Tele monitoring - Treat infection - D/C  lopressor  Metabolic acidosis - Likely secondary to AKI/CKD, d/c bicarb once CRRT is started  History of dementia - Supportive measures  GI bleeding concern: reportedly old blood now - H&H q6 - Hold further transfusions for now - If changes may need GI involvement  Nutrition - Fube feeding per protocol  Best practice:  Diet: N.p.o. Pain/Anxiety/Delirium protocol (if indicated): As needed  VAP protocol (if indicated): In place DVT prophylaxis: Heparin GI prophylaxis: Protonix Glucose control: SSI Mobility: Bedrest Code Status: Full code Family Communication: Discussed with daughter Disposition: See you  Labs   CBC: Recent Labs  Lab December 19, 2018 1645  December 19, 2018 2109 12/01/18 0438 12/02/18 0526 12/02/18 1500 12/02/18 1752 12/03/18 0249 12/03/18 0414 12/03/18 0945  WBC 8.7  --  7.3 7.1 7.8  --   --  6.0  --   --   NEUTROABS 6.8  --   --   --  6.1  --   --  4.7  --   --   HGB 8.1*   < > 7.5* 7.7* 7.7* 6.1* 5.4* 9.6* 8.5* 9.7*  HCT 26.7*   < > 24.8* 24.8* 25.6* 18.0* 16.0* 29.8* 25.0* 30.0*  MCV 94.7  --  93.6 92.2 93.4  --   --  87.4  --   --   PLT 185  --  161 163 159  --   --  144*  --   --    < > = values in this interval not displayed.    Basic Metabolic Panel: Recent Labs  Lab December 19, 2018 2109 12/01/18 0438 12/01/18 0844 12/01/18 1658 12/02/18 0526 12/02/18 1500 12/02/18 1752 12/03/18 0249 12/03/18 0414  NA 145 143 146* 146* 150* 149* 147* 145 146*  K 5.4* 5.3* 5.4* 5.6* 5.5* 5.4* 5.3* 4.9 5.0  CL 119* 121* 123* 122* 124*  --   --  118*  --   CO2 16* 14* 14* 13* 14*  --   --  15*  --   GLUCOSE 111* 109* 109* 122* 127*  --   --  250*  --   BUN 73* 70* 68* 72* 81*  --   --  82*  --   CREATININE 5.90* 5.73* 6.04* 6.34* 6.55*  --   --  6.62*  --   CALCIUM 7.5* 7.5* 7.6* 7.8* 8.1*  --   --  7.4*  --   MG 2.1  --   --   --  2.2  --   --  2.2  --   PHOS 4.7*  --   --   --  5.5*  --   --  5.3*  --    GFR: Estimated Creatinine Clearance: 8.8 mL/min (A) (by C-G  formula based on SCr of 6.62 mg/dL (H)). Recent Labs  Lab December 19, 2018 1627  December 19, 2018 1645 December 19, 2018 1735 December 19, 2018 2109 12/01/18 0438 12/01/18 0615 12/02/18 0526 12/03/18 0249  PROCALCITON  --   --  6.78  --  7.19  --   --   --   --  WBC  --    < > 8.7  --  7.3 7.1  --  7.8 6.0  LATICACIDVEN 1.3  --   --  1.1  --   --  0.9  --  0.9   < > = values in this interval not displayed.    Liver Function Tests: Recent Labs  Lab 11/17/2018 1645 12/02/18 0526  AST 35  --   ALT 18  --   ALKPHOS 183*  --   BILITOT 0.4  --   PROT 6.5  --   ALBUMIN 2.7* 2.4*   No results for input(s): LIPASE, AMYLASE in the last 168 hours. No results for input(s): AMMONIA in the last 168 hours.  ABG    Component Value Date/Time   PHART 7.257 (L) 12/03/2018 0414   PCO2ART 34.1 12/03/2018 0414   PO2ART 27.0 (LL) 12/03/2018 0414   HCO3 15.2 (L) 12/03/2018 0414   TCO2 16 (L) 12/03/2018 0414   ACIDBASEDEF 11.0 (H) 12/03/2018 0414   O2SAT 43.0 12/03/2018 0414     Coagulation Profile: No results for input(s): INR, PROTIME in the last 168 hours.  Cardiac Enzymes: Recent Labs  Lab 12/08/2018 1645  TROPONINI 0.11*    HbA1C: No results found for: HGBA1C  CBG: Recent Labs  Lab 12/02/18 1612 12/02/18 1941 12/03/18 0024 12/03/18 0342 12/03/18 0724  GLUCAP 108* 132* 227* 226* 238*   The patient is critically ill with multiple organ systems failure and requires high complexity decision making for assessment and support, frequent evaluation and titration of therapies, application of advanced monitoring technologies and extensive interpretation of multiple databases.   Critical Care Time devoted to patient care services described in this note is  33  Minutes. This time reflects time of care of this signee Dr Koren Bound. This critical care time does not reflect procedure time, or teaching time or supervisory time of PA/NP/Med student/Med Resident etc but could involve care discussion time.  Alyson Reedy, M.D. [facility] Pulmonary/Critical Care Medicine. Pager: [phone]. After hours pager: [phone].

## 2018-12-03 NOTE — Progress Notes (Signed)
Pharmacy Antibiotic Note  Kalyse Pavlovic is a 78 y.o. female admitted on 2018/12/07 with HCAP/PNA, also COVID +.  Pharmacy has been consulted for cefepime/vancomycin dosing.  Renal function worsening and patient to start CRRT today.  Tmax 101.9, WBC WNL, LA 0.9.  Plan: Schedule vanc 1gm IV Q24H for trough 15-20 mcg/mL Change cefepime to 2gm IV Q12H Monitor CRRT tolerance/interruption, clinical progress, vanc level as indicated   Height: 5\' 4"  (162.6 cm) Weight: 244 lb 11.4 oz (111 kg) IBW/kg (Calculated) : 54.7  Temp (24hrs), Avg:100.3 F (37.9 C), Min:98.5 F (36.9 C), Max:101.9 F (38.8 C)  Recent Labs  Lab 12/07/18 1627 12-07-18 1645  12-07-2018 1735 12/07/2018 2109 12/01/18 0438 12/01/18 0615 12/01/18 0844 12/01/18 1658 12/02/18 0526 12/03/18 0249  WBC  --  8.7  --   --  7.3 7.1  --   --   --  7.8 6.0  CREATININE  --  5.90*   < >  --  5.90* 5.73*  --  6.04* 6.34* 6.55* 6.62*  LATICACIDVEN 1.3  --   --  1.1  --   --  0.9  --   --   --  0.9  VANCORANDOM  --   --   --   --   --   --   --   --   --  19  --    < > = values in this interval not displayed.    Estimated Creatinine Clearance: 8.8 mL/min (A) (by C-G formula based on SCr of 6.62 mg/dL (H)).    No Known Allergies   Vanc 5/14 >> Cefepime 5/14 >>  5/14 Vanc 1750mg  IV x 1 at 1800 5/16 at 0530 VR = 19 mcg/mL >> redose with 1gm at 2030 5/17 start CRRT  5/11 COVID - positive  5/14 BCx - NGTD 5/14 UCx - negative 5/16 MRSA PCR - positive 5/17 BCx -   Thursa Emme D. Laney Potash, PharmD, BCPS, BCCCP 12/03/2018, 11:13 AM

## 2018-12-03 NOTE — Significant Event (Signed)
PCCM Interval Note  I spoke with the patient's daughter and HCPOA Lynford Humphrey.  Explained to her that her mother is to be transferred to [facility] today to continue her care.   I explained to her the rationale for off label Actemra, the potential risks and benefits.  In particular explained that there may be some increased risk for superimposed bacterial infection, but that the patient is covered with antibiotics which attenuate some of that risk.  Thelma Barge agreed that the benefits outweigh the risks and agrees to proceed with Actemra as per our current plans.  We will give the first dose when she arrives and is stabilized here in our ICU.  Levy Pupa, MD, PhD 12/03/2018, 1:41 PM [place] [facility] [phone] or if no answer [phone]

## 2018-12-03 NOTE — Procedures (Signed)
Central Venous dialysis Catheter Insertion Procedure Note Gypsie Lybrook [id number] [birth year]  Procedure: Insertion of Central Venous Catheter Indications: dialysis   Procedure Details Consent: Risks of procedure as well as the alternatives and risks of each were explained to the (patient/caregiver).  Consent for procedure obtained. Time Out: Verified patient identification, verified procedure, site/side was marked, verified correct patient position, special equipment/implants available, medications/allergies/relevent history reviewed, required imaging and test results available.  Performed Real time [place] used to ID and cannulate  Maximum sterile technique was used including antiseptics, cap, gloves, gown, hand hygiene, mask and sheet. Skin prep: Chlorhexidine; local anesthetic administered A antimicrobial bonded/coated triple lumen catheter was placed in the left internal jugular vein using the Seldinger technique.  Evaluation Blood flow good Complications: No apparent complications Patient did tolerate procedure well. Chest X-ray ordered to verify placement.  CXR: pending.  Shelby Mattocks 12/03/2018, 11:54 AM  Simonne Martinet ACNP-BC [facility] Pulmonary/Critical Care Pager # [phone] OR # [phone] if no answer

## 2018-12-03 NOTE — Progress Notes (Addendum)
[facility] Progress Note   Subjective:   Remains intubated on 100% FiO2.  Modest hypotension currently on NE 1. On bicarb gtt now. UOP 625 yesterday, net +2.5L.  Req'd pRBC for Hb down to 5.4 yesterday, dark stool noted.   Objective Vitals:   12/03/18 0600 12/03/18 0615 12/03/18 0630 12/03/18 0645  BP: 115/61 (!) 118/58 (!) 115/58 (!) 121/52  Pulse: (!) 59 (!) 59 (!) 59 (!) 59  Resp: (!) 21 (!) 23 (!) 25 (!) 25  Temp:      TempSrc:      SpO2: 100% 100% 100% 100%  Weight:      Height:       Physical Exam Intubated and sedated currently on 100% FIO2, PEEP 6.  Remainder of exam deferred in light of COVID19 mitigation strategies.  Discussed with RN in ICU.  Additional Objective Labs: Basic Metabolic Panel: Recent Labs  Lab 12/06/2018 2109  12/01/18 1658 12/02/18 0526  12/02/18 1752 12/03/18 0249 12/03/18 0414  NA 145   < > 146* 150*   < > 147* 145 146*  K 5.4*   < > 5.6* 5.5*   < > 5.3* 4.9 5.0  CL 119*   < > 122* 124*  --   --  118*  --   CO2 16*   < > 13* 14*  --   --  15*  --   GLUCOSE 111*   < > 122* 127*  --   --  250*  --   BUN 73*   < > 72* 81*  --   --  82*  --   CREATININE 5.90*   < > 6.34* 6.55*  --   --  6.62*  --   CALCIUM 7.5*   < > 7.8* 8.1*  --   --  7.4*  --   PHOS 4.7*  --   --  5.5*  --   --  5.3*  --    < > = values in this interval not displayed.   Liver Function Tests: Recent Labs  Lab 11/27/2018 1645 12/02/18 0526  AST 35  --   ALT 18  --   ALKPHOS 183*  --   BILITOT 0.4  --   PROT 6.5  --   ALBUMIN 2.7* 2.4*   No results for input(s): LIPASE, AMYLASE in the last 168 hours. CBC: Recent Labs  Lab 11/28/2018 1645  11/21/2018 2109 12/01/18 0438 12/02/18 0526  12/02/18 1752 12/03/18 0249 12/03/18 0414  WBC 8.7  --  7.3 7.1 7.8  --   --  6.0  --   NEUTROABS 6.8  --   --   --  6.1  --   --  4.7  --   HGB 8.1*   < > 7.5* 7.7* 7.7*   < > 5.4* 9.6* 8.5*  HCT 26.7*   < > 24.8* 24.8* 25.6*   < > 16.0* 29.8* 25.0*  MCV 94.7  --   93.6 92.2 93.4  --   --  87.4  --   PLT 185  --  161 163 159  --   --  144*  --    < > = values in this interval not displayed.   Blood Culture    Component Value Date/Time   SDES URINE, CATHETERIZED 11/29/2018 1728   SPECREQUEST NONE 11/17/2018 1728   CULT (A) 12/12/2018 1728    <10,000 COLONIES/mL INSIGNIFICANT GROWTH Performed at [facility]  [address] [place], [place] [address]    REPTSTATUS 12/01/2018 FINAL 12/06/2018 1728    Cardiac Enzymes: Recent Labs  Lab 11/27/2018 1645  TROPONINI 0.11*   CBG: Recent Labs  Lab 12/02/18 1612 12/02/18 1941 12/03/18 0024 12/03/18 0342 12/03/18 0724  GLUCAP 108* 132* 227* 226* 238*   Iron Studies:  Recent Labs    12/02/18 0526 12/03/18 0249  IRON 25*  --   TIBC 204*  --   FERRITIN 622* 898*   @lablastinr3 @ Studies/Results: Dg Abd 1 View  Result Date: 12/02/2018 CLINICAL DATA:  Bedside nasogastric tube placement. EXAM: ABDOMEN - 1 VIEW COMPARISON:  CT abdomen and pelvis yesterday. Abdominal x-ray 11/21/2018. FINDINGS: Nasogastric tube looped in the stomach with its tip at the expected location of the gastric body. Visualized bowel gas pattern nonobstructive and unchanged. IMPRESSION: Nasogastric tube looped in the stomach with its tip at the expected location of the gastric body. Electronically Signed   By: Evangeline Dakin M.D.   On: 12/02/2018 14:17   Dg Chest Port 1 View  Result Date: 12/02/2018 CLINICAL DATA:  Respiratory failure. EXAM: PORTABLE CHEST 1 VIEW COMPARISON:  Radiographs earlier this day. FINDINGS: Endotracheal tube tip is 16 mm from the carina. Tip and side port of the enteric tube below the diaphragm in the stomach. Right internal jugular central venous catheter tip in the atrial caval junction. Patient is rotated. Multifocal airspace opacities, right greater than left, slightly progressed in the upper lung zones. Unchanged heart size and mediastinal contours allowing for rotation. No large pleural  effusion. No pneumothorax. IMPRESSION: 1. Slightly radiographic progression of multifocal Covid pneumonia, right greater than left. 2. Endotracheal tube tip borderline low 16 mm from the carina. Electronically Signed   By: Keith Rake M.D.   On: 12/02/2018 21:46   Dg Chest Port 1 View  Result Date: 12/02/2018 CLINICAL DATA:  Ventilator dependent respiratory failure. Current history of COVID-19 pneumonia. EXAM: PORTABLE CHEST 1 VIEW COMPARISON:  Portable chest x-ray earlier same day at 4:04 a.m. and on 12/01/2018. FINDINGS: Endotracheal tube tip projects approximately 2-3 cm above the carina. RIGHT jugular central venous catheter tip projects at or near the cavoatrial junction. Nasogastric tube courses below the diaphragm into the stomach. Since the examination earlier same day, no significant change in the airspace opacities throughout the RIGHT lung, at the LEFT lung base, and in the LEFT mid lung. No new pulmonary parenchymal abnormalities. IMPRESSION: 1. Support apparatus satisfactory. 2. Stable pneumonia throughout the RIGHT lung, the LEFT lung base and the LEFT mid lung. 3. No new abnormalities. Electronically Signed   By: Evangeline Dakin M.D.   On: 12/02/2018 14:15   Dg Chest Port 1 View  Result Date: 12/02/2018 CLINICAL DATA:  Follow-up COVID-19 positive patient. EXAM: PORTABLE CHEST 1 VIEW COMPARISON:  Nov 30, 2018 FINDINGS: Increasing bilateral pulmonary infiltrates are identified, right greater than left, consistent with the patient's history. No pneumothorax. No change in the cardiomediastinal silhouette. IMPRESSION: Worsening bilateral infiltrates, right greater than left, consistent with the patient's history. Electronically Signed   By: Dorise Bullion III M.D   On: 12/02/2018 08:07   Ct Renal Stone Study  Result Date: 12/01/2018 CLINICAL DATA:  AKI, rule out renal obstruction EXAM: CT ABDOMEN AND PELVIS WITHOUT CONTRAST TECHNIQUE: Multidetector CT imaging of the abdomen and pelvis  was performed following the standard protocol without IV contrast. COMPARISON:  None. FINDINGS: Examination of the abdomen is somewhat limited by breath motion artifact throughout. Lower chest: Bibasilar ground-glass and heterogeneous airspace opacities  and small bilateral pleural effusions. Coronary artery calcifications. Hepatobiliary: No focal liver abnormality is seen. No gallstones, gallbladder wall thickening, or biliary dilatation. Pancreas: Unremarkable. No pancreatic ductal dilatation or surrounding inflammatory changes. Spleen: Normal in size without focal abnormality. Adrenals/Urinary Tract: Adrenal glands are unremarkable. No evidence of renal calculus or hydronephrosis. There is a calcification in the vicinity of the mid left ureter, which appears to be within the left ovarian vein, reflecting a phlebolith. The urinary bladder is decompressed by a Foley catheter. Stomach/Bowel: Stomach is within normal limits. Appendix appears normal. No evidence of bowel wall thickening, distention, or inflammatory changes. Vascular/Lymphatic: Calcific atherosclerosis. No enlarged abdominal or pelvic lymph nodes. Reproductive: Uterine fibroids. Other: No abdominal wall hernia or abnormality. No abdominopelvic ascites. Musculoskeletal: No acute or significant osseous findings. IMPRESSION: 1. Examination of the abdomen is somewhat limited by breath motion artifact throughout. 2. No evidence of renal calculus or hydronephrosis. There is a calcification in the vicinity of the mid left ureter, which appears to be within the left ovarian vein, reflecting a phlebolith. The urinary bladder is decompressed by a Foley catheter. 3. Bibasilar ground-glass and heterogeneous airspace opacities and small bilateral pleural effusions, generally consistent with infection or aspiration. 4.  Other chronic and incidental findings as detailed above. Electronically Signed   By: Eddie Candle M.D.   On: 12/01/2018 21:18   Medications: .  ceFEPime (MAXIPIME) IV 1 g (12/02/18 2352)  . dexmedetomidine (PRECEDEX) IV infusion 0.5 mcg/kg/hr (12/03/18 0600)  . norepinephrine (LEVOPHED) Adult infusion 1 mcg/min (12/03/18 0600)  .  sodium bicarbonate  infusion 1000 mL 50 mL/hr at 12/03/18 0600   . chlorhexidine gluconate (MEDLINE KIT)  15 mL Mouth Rinse BID  . Chlorhexidine Gluconate Cloth  6 each Topical Q0600  . feeding supplement (PRO-STAT SUGAR FREE 64)  30 mL Per Tube BID  . feeding supplement (VITAL HIGH PROTEIN)  1,000 mL Per Tube Q24H  . free water  200 mL Per Tube Q8H  . insulin aspart  0-9 Units Subcutaneous Q4H  . mouth rinse  15 mL Mouth Rinse 10 times per day  . metoprolol tartrate  25 mg Oral BID  . mupirocin ointment  1 application Nasal BID  . pantoprazole (PROTONIX) IV  40 mg Intravenous Q12H  . sodium bicarbonate  1,300 mg Oral TID  . sodium zirconium cyclosilicate  10 g Oral BID  . vancomycin variable dose per unstable renal function (pharmacist dosing)   Does not apply See admin instructions    Assessment/Plan: 1.  Hypoxic respiratory failure secondary to COVID 19 +/- bacterial PNA:  Clinically worsening and now intubated.  HCAP antibiotics per primary, dose vanc for renal function.  CXR with worsening infiltrates but without pulmonary edema but in oliguric state with severe AKI would be very judicious with IVF.  I don't see a need for diuresis at this point to help pulmonary status.   2.  Sepsis secondary to #1:  Care per above.    3.  AKI on CKD: unknown baseline but per staff at [facility] 10/2018 Cr 3.5, now with severe AKI in setting of #1 and 2 above.  Suspect ATN type picture in setting of #1 and 2 above.  UA with small blood (no RBC on microscopy) and 2+ proteinuria.  AKI is quite common with COVID 19 and proteinuria is a frequent feature, though she has underlying DM and could have assoc proteinuria at baseline.  CT without obstruction.   She has no indications for dialysis and is  a marginal chronic  dialysis candidate at baseline. She has no urgent indications for dialysis (though concern for GI bleed contributor is uremia which would be an indication) at this time but I expect she will develop them in the next 24-48hrs.  I would favor proceeding with CRRT now on a nonemergent basis but will plan to d/w PCCM as they round.    4.  Hyperkalemia: mild, on lokelma 10 BID for now.  K 5 this AM.  Stop lokelma if CRRT initiated.   5.  Non anion gap metabolic acidosis secondary to AKI:  Bicarb gtt 50/hr + oral bicarb.  Improving     6.  Hypernatremia:  Resolved with addition of FWF. CTM.   7.  Anemia:  Hb on presentation 8.1 > 7.7 > 5.4 > transfusion 2u > 9.6 > 8.5.  PPI initiated after dark stool noted.  Iron sat 12% but hold on IV iron in setting of infection. Normal B12, folate.    Jannifer Hick MD 12/03/2018, 8:08 AM  [facility] Pager: [phone]  ADDENDUM: reviewed PCCM MD Nelda Marseille notes, plan start CRRT.  Thanks for placing catheter.  Orders for CRRT placed, given COVID increased risk for clotting use heparin infusion from initiation of CRRT to maintain filter patency.  Called daughter Dub Mikes and updated via phone as well; she wishes to proceed with RRT.  All questions answered. Vidal Schwalbe

## 2018-12-03 NOTE — Progress Notes (Signed)
Patient's ABG documented as arterial, but informed via RT that the sample was venous. Elink called and notified.

## 2018-12-04 ENCOUNTER — Inpatient Hospital Stay (HOSPITAL_COMMUNITY): Payer: 59

## 2018-12-04 DIAGNOSIS — J9621 Acute and chronic respiratory failure with hypoxia: Secondary | ICD-10-CM

## 2018-12-04 DIAGNOSIS — R680 Hypothermia, not associated with low environmental temperature: Secondary | ICD-10-CM

## 2018-12-04 DIAGNOSIS — J80 Acute respiratory distress syndrome: Secondary | ICD-10-CM

## 2018-12-04 DIAGNOSIS — N170 Acute kidney failure with tubular necrosis: Secondary | ICD-10-CM

## 2018-12-04 LAB — HEMOGLOBIN AND HEMATOCRIT, BLOOD
HCT: 33.8 % — ABNORMAL LOW (ref 36.0–46.0)
HCT: 35.1 % — ABNORMAL LOW (ref 36.0–46.0)
Hemoglobin: 11.2 g/dL — ABNORMAL LOW (ref 12.0–15.0)
Hemoglobin: 11.6 g/dL — ABNORMAL LOW (ref 12.0–15.0)

## 2018-12-04 LAB — POCT I-STAT 7, (LYTES, BLD GAS, ICA,H+H)
Acid-base deficit: 5 mmol/L — ABNORMAL HIGH (ref 0.0–2.0)
Acid-base deficit: 7 mmol/L — ABNORMAL HIGH (ref 0.0–2.0)
Acid-base deficit: 7 mmol/L — ABNORMAL HIGH (ref 0.0–2.0)
Acid-base deficit: 5 mmol/L — ABNORMAL HIGH (ref 0.0–2.0)
Acid-base deficit: 6 mmol/L — ABNORMAL HIGH (ref 0.0–2.0)
Bicarbonate: 18.4 mmol/L — ABNORMAL LOW (ref 20.0–28.0)
Bicarbonate: 22.5 mmol/L (ref 20.0–28.0)
Bicarbonate: 21.9 mmol/L (ref 20.0–28.0)
Bicarbonate: 22.1 mmol/L (ref 20.0–28.0)
Bicarbonate: 22.2 mmol/L (ref 20.0–28.0)
Calcium, Ion: 1.06 mmol/L — ABNORMAL LOW (ref 1.15–1.40)
Calcium, Ion: 1.08 mmol/L — ABNORMAL LOW (ref 1.15–1.40)
Calcium, Ion: 1.06 mmol/L — ABNORMAL LOW (ref 1.15–1.40)
Calcium, Ion: 1.09 mmol/L — ABNORMAL LOW (ref 1.15–1.40)
Calcium, Ion: 1.14 mmol/L — ABNORMAL LOW (ref 1.15–1.40)
HCT: 32 % — ABNORMAL LOW (ref 36.0–46.0)
HCT: 34 % — ABNORMAL LOW (ref 36.0–46.0)
HCT: 32 % — ABNORMAL LOW (ref 36.0–46.0)
HCT: 32 % — ABNORMAL LOW (ref 36.0–46.0)
HCT: 33 % — ABNORMAL LOW (ref 36.0–46.0)
Hemoglobin: 10.9 g/dL — ABNORMAL LOW (ref 12.0–15.0)
Hemoglobin: 11.6 g/dL — ABNORMAL LOW (ref 12.0–15.0)
Hemoglobin: 10.9 g/dL — ABNORMAL LOW (ref 12.0–15.0)
Hemoglobin: 10.9 g/dL — ABNORMAL LOW (ref 12.0–15.0)
Hemoglobin: 11.2 g/dL — ABNORMAL LOW (ref 12.0–15.0)
O2 Saturation: 85 %
O2 Saturation: 83 %
O2 Saturation: 93 %
O2 Saturation: 91 %
O2 Saturation: 93 %
Patient temperature: 34.2
Patient temperature: 98.6
Patient temperature: 98.4
Patient temperature: 97.9
Patient temperature: 97.9
Potassium: 4 mmol/L (ref 3.5–5.1)
Potassium: 4.1 mmol/L (ref 3.5–5.1)
Potassium: 4 mmol/L (ref 3.5–5.1)
Potassium: 4 mmol/L (ref 3.5–5.1)
Potassium: 4.1 mmol/L (ref 3.5–5.1)
Sodium: 142 mmol/L (ref 135–145)
Sodium: 141 mmol/L (ref 135–145)
Sodium: 142 mmol/L (ref 135–145)
Sodium: 140 mmol/L (ref 135–145)
Sodium: 139 mmol/L (ref 135–145)
TCO2: 19 mmol/L — ABNORMAL LOW (ref 22–32)
TCO2: 24 mmol/L (ref 22–32)
TCO2: 24 mmol/L (ref 22–32)
TCO2: 24 mmol/L (ref 22–32)
TCO2: 24 mmol/L (ref 22–32)
pCO2 arterial: 24 mmHg — ABNORMAL LOW (ref 32.0–48.0)
pCO2 arterial: 62.7 mmHg — ABNORMAL HIGH (ref 32.0–48.0)
pCO2 arterial: 57.4 mmHg — ABNORMAL HIGH (ref 32.0–48.0)
pCO2 arterial: 50.6 mmHg — ABNORMAL HIGH (ref 32.0–48.0)
pCO2 arterial: 52.1 mmHg — ABNORMAL HIGH (ref 32.0–48.0)
pH, Arterial: 7.481 — ABNORMAL HIGH (ref 7.350–7.450)
pH, Arterial: 7.163 — CL (ref 7.350–7.450)
pH, Arterial: 7.189 — CL (ref 7.350–7.450)
pH, Arterial: 7.246 — ABNORMAL LOW (ref 7.350–7.450)
pH, Arterial: 7.235 — ABNORMAL LOW (ref 7.350–7.450)
pO2, Arterial: 38 mmHg — CL (ref 83.0–108.0)
pO2, Arterial: 61 mmHg — ABNORMAL LOW (ref 83.0–108.0)
pO2, Arterial: 86 mmHg (ref 83.0–108.0)
pO2, Arterial: 71 mmHg — ABNORMAL LOW (ref 83.0–108.0)
pO2, Arterial: 80 mmHg — ABNORMAL LOW (ref 83.0–108.0)

## 2018-12-04 LAB — BASIC METABOLIC PANEL
Anion gap: 11 (ref 5–15)
BUN: 60 mg/dL — ABNORMAL HIGH (ref 8–23)
CO2: 20 mmol/L — ABNORMAL LOW (ref 22–32)
Calcium: 7.3 mg/dL — ABNORMAL LOW (ref 8.9–10.3)
Chloride: 109 mmol/L (ref 98–111)
Creatinine, Ser: 3.98 mg/dL — ABNORMAL HIGH (ref 0.44–1.00)
GFR calc Af Amer: 12 mL/min — ABNORMAL LOW (ref >60)
GFR calc non Af Amer: 10 mL/min — ABNORMAL LOW (ref >60)
Glucose, Bld: 264 mg/dL — ABNORMAL HIGH (ref 70–99)
Potassium: 4.1 mmol/L (ref 3.5–5.1)
Sodium: 140 mmol/L (ref 135–145)

## 2018-12-04 LAB — CBC
HCT: 33.7 % — ABNORMAL LOW (ref 36.0–46.0)
Hemoglobin: 11.2 g/dL — ABNORMAL LOW (ref 12.0–15.0)
MCH: 28.6 pg (ref 26.0–34.0)
MCHC: 33.2 g/dL (ref 30.0–36.0)
MCV: 86.2 fL (ref 80.0–100.0)
Platelets: 129 10*3/uL — ABNORMAL LOW (ref 150–400)
RBC: 3.91 MIL/uL (ref 3.87–5.11)
RDW: 15.9 % — ABNORMAL HIGH (ref 11.5–15.5)
WBC: 6 10*3/uL (ref 4.0–10.5)
nRBC: 0.5 % — ABNORMAL HIGH (ref 0.0–0.2)

## 2018-12-04 LAB — RENAL FUNCTION PANEL
Albumin: 2.2 g/dL — ABNORMAL LOW (ref 3.5–5.0)
Anion gap: 10 (ref 5–15)
BUN: 52 mg/dL — ABNORMAL HIGH (ref 8–23)
CO2: 22 mmol/L (ref 22–32)
Calcium: 7.3 mg/dL — ABNORMAL LOW (ref 8.9–10.3)
Chloride: 107 mmol/L (ref 98–111)
Creatinine, Ser: 3.24 mg/dL — ABNORMAL HIGH (ref 0.44–1.00)
GFR calc Af Amer: 15 mL/min — ABNORMAL LOW (ref >60)
GFR calc non Af Amer: 13 mL/min — ABNORMAL LOW (ref >60)
Glucose, Bld: 269 mg/dL — ABNORMAL HIGH (ref 70–99)
Phosphorus: 4.9 mg/dL — ABNORMAL HIGH (ref 2.5–4.6)
Potassium: 4.2 mmol/L (ref 3.5–5.1)
Sodium: 139 mmol/L (ref 135–145)

## 2018-12-04 LAB — C-REACTIVE PROTEIN: CRP: 25.5 mg/dL — ABNORMAL HIGH (ref <1.0)

## 2018-12-04 LAB — FERRITIN: Ferritin: 1267 ng/mL — ABNORMAL HIGH (ref 11–307)

## 2018-12-04 LAB — TYPE AND SCREEN
ABO/RH(D): O POS
ABO/RH(D): O POS
Antibody Screen: NEGATIVE
Antibody Screen: NEGATIVE

## 2018-12-04 LAB — GLUCOSE, CAPILLARY
Glucose-Capillary: 238 mg/dL — ABNORMAL HIGH (ref 70–99)
Glucose-Capillary: 238 mg/dL — ABNORMAL HIGH (ref 70–99)
Glucose-Capillary: 206 mg/dL — ABNORMAL HIGH (ref 70–99)
Glucose-Capillary: 233 mg/dL — ABNORMAL HIGH (ref 70–99)
Glucose-Capillary: 198 mg/dL — ABNORMAL HIGH (ref 70–99)
Glucose-Capillary: 198 mg/dL — ABNORMAL HIGH (ref 70–99)

## 2018-12-04 LAB — D-DIMER, QUANTITATIVE: D-Dimer, Quant: 2.15 ug/mL-FEU — ABNORMAL HIGH (ref 0.00–0.50)

## 2018-12-04 LAB — APTT: aPTT: 73 seconds — ABNORMAL HIGH (ref 24–36)

## 2018-12-04 LAB — MAGNESIUM: Magnesium: 2.3 mg/dL (ref 1.7–2.4)

## 2018-12-04 LAB — PHOSPHORUS: Phosphorus: 3.4 mg/dL (ref 2.5–4.6)

## 2018-12-04 MED ORDER — MIDAZOLAM BOLUS VIA INFUSION
1.0000 mg | INTRAVENOUS | Status: DC | PRN
  Filled 2018-12-04: qty 2

## 2018-12-04 MED ORDER — MIDAZOLAM 50MG/50ML (1MG/ML) PREMIX INFUSION
0.0000 mg/h | INTRAVENOUS | Status: DC
  Administered 2018-12-04: 4 mg/h via INTRAVENOUS
  Filled 2018-12-04: qty 50

## 2018-12-04 MED ORDER — MIDAZOLAM HCL 2 MG/2ML IJ SOLN
1.0000 mg | INTRAMUSCULAR | Status: DC | PRN
  Administered 2018-12-04 (×2): 1 mg via INTRAVENOUS
  Filled 2018-12-04: qty 2

## 2018-12-04 MED ORDER — MIDAZOLAM HCL 2 MG/2ML IJ SOLN
INTRAMUSCULAR | Status: AC
  Filled 2018-12-04: qty 2

## 2018-12-04 MED ORDER — FENTANYL 2500MCG IN NS 250ML (10MCG/ML) PREMIX INFUSION
25.0000 ug/h | INTRAVENOUS | Status: DC
  Administered 2018-12-04: 300 ug/h via INTRAVENOUS
  Administered 2018-12-05: 250 ug/h via INTRAVENOUS
  Administered 2018-12-05: 275 ug/h via INTRAVENOUS
  Administered 2018-12-05: 300 ug/h via INTRAVENOUS
  Administered 2018-12-06: 250 ug/h via INTRAVENOUS
  Filled 2018-12-04 (×5): qty 250

## 2018-12-04 MED ORDER — VITAL HIGH PROTEIN PO LIQD
1000.0000 mL | ORAL | Status: DC
  Administered 2018-12-05 – 2018-12-09 (×3): 1000 mL

## 2018-12-04 MED ORDER — VECURONIUM BROMIDE 10 MG IV SOLR
0.0000 ug/kg/min | INTRAVENOUS | Status: DC
  Administered 2018-12-04 – 2018-12-06 (×4): 1 ug/kg/min via INTRAVENOUS
  Filled 2018-12-04 (×4): qty 100

## 2018-12-04 MED ORDER — MIDAZOLAM 50MG/50ML (1MG/ML) PREMIX INFUSION
2.0000 mg/h | INTRAVENOUS | Status: DC
  Administered 2018-12-04 (×3): 10 mg/h via INTRAVENOUS
  Administered 2018-12-05 (×3): 8 mg/h via INTRAVENOUS
  Administered 2018-12-05 – 2018-12-06 (×2): 6 mg/h via INTRAVENOUS
  Filled 2018-12-04 (×9): qty 50

## 2018-12-04 MED ORDER — TOCILIZUMAB 400 MG/20ML IV SOLN
800.0000 mg | Freq: Once | INTRAVENOUS | Status: AC
  Administered 2018-12-04: 16:00:00 800 mg via INTRAVENOUS
  Filled 2018-12-04: qty 40

## 2018-12-04 MED ORDER — ARTIFICIAL TEARS OPHTHALMIC OINT
1.0000 "application " | TOPICAL_OINTMENT | Freq: Three times a day (TID) | OPHTHALMIC | Status: DC
  Administered 2018-12-04 – 2018-12-06 (×6): 1 via OPHTHALMIC
  Filled 2018-12-04: qty 3.5

## 2018-12-04 MED ORDER — FENTANYL CITRATE (PF) 100 MCG/2ML IJ SOLN
25.0000 ug | Freq: Once | INTRAMUSCULAR | Status: AC
  Administered 2018-12-04: 25 ug via INTRAVENOUS

## 2018-12-04 MED ORDER — VECURONIUM BOLUS VIA INFUSION
0.0800 mg/kg | Freq: Once | INTRAVENOUS | Status: AC
  Administered 2018-12-04: 9.2 mg via INTRAVENOUS
  Filled 2018-12-04: qty 10

## 2018-12-04 MED ORDER — INSULIN GLARGINE 100 UNIT/ML ~~LOC~~ SOLN
10.0000 [IU] | Freq: Every day | SUBCUTANEOUS | Status: DC
  Administered 2018-12-04 – 2018-12-10 (×7): 10 [IU] via SUBCUTANEOUS
  Filled 2018-12-04 (×7): qty 0.1

## 2018-12-04 MED ORDER — FENTANYL 2500MCG IN NS 250ML (10MCG/ML) PREMIX INFUSION
25.0000 ug/h | INTRAVENOUS | Status: DC
  Administered 2018-12-04: 10:00:00 100 ug/h via INTRAVENOUS
  Filled 2018-12-04: qty 250

## 2018-12-04 MED ORDER — NOREPINEPHRINE 4 MG/250ML-% IV SOLN
0.0000 ug/min | INTRAVENOUS | Status: DC
  Administered 2018-12-04: 7 ug/min via INTRAVENOUS
  Administered 2018-12-05: 6 ug/min via INTRAVENOUS
  Administered 2018-12-05: 4 ug/min via INTRAVENOUS
  Administered 2018-12-06: 5 ug/min via INTRAVENOUS
  Filled 2018-12-04 (×5): qty 250

## 2018-12-04 MED ORDER — FENTANYL CITRATE (PF) 100 MCG/2ML IJ SOLN
25.0000 ug | INTRAMUSCULAR | Status: DC | PRN
  Administered 2018-12-04 (×2): 50 ug via INTRAVENOUS
  Filled 2018-12-04 (×2): qty 2

## 2018-12-04 MED ORDER — SODIUM BICARBONATE 650 MG PO TABS
1300.0000 mg | ORAL_TABLET | Freq: Three times a day (TID) | ORAL | Status: DC
  Administered 2018-12-04: 09:00:00 1300 mg
  Filled 2018-12-04 (×2): qty 2

## 2018-12-04 MED ORDER — FENTANYL BOLUS VIA INFUSION
25.0000 ug | INTRAVENOUS | Status: DC | PRN
  Filled 2018-12-04: qty 25

## 2018-12-04 MED ORDER — FENTANYL CITRATE (PF) 100 MCG/2ML IJ SOLN: 25.0000 ug | Freq: Once | INTRAMUSCULAR | Status: DC

## 2018-12-04 NOTE — Progress Notes (Signed)
PROGRESS NOTE  Julia Thomas QZR:[id number] DOB: 09-08-1940 DOA: 11/17/2018  PCP: Leanna Battles, MD  Brief History/Interval Summary: 78 year old African-American female, skilled nursing facility resident, with past medical history significant for recently positive test for COVID 19, chronic kidney disease with unknown baseline, diabetes mellitus, hypertension, CHF, dementia amongst other past medical and cardiac history.  At the skilled nursing facility patient was found to have poor oral intake.  Blood work revealed acute renal failure.  Patient was transferred to the hospital.  Patient became progressively hypoxic.  Transferred to the intensive care unit.  Subsequently intubated.  Also developed acute renal failure requiring CRRT.  Reason for Visit: Acute respiratory failure with hypoxia secondary to COVID-19  Consultants: Pulmonology.  Nephrology.  Procedures:  Intubated Dialysis catheter placement 5/17  Antibiotics: Anti-infectives (From admission, onward)   Start     Dose/Rate Route Frequency Ordered Stop   12/03/18 2200  vancomycin (VANCOCIN) IVPB 1000 mg/200 mL premix     1,000 mg 200 mL/hr over 60 Minutes Intravenous Every 24 hours 12/03/18 1115     12/03/18 1600  ceFEPIme (MAXIPIME) 2 g in sodium chloride 0.9 % 100 mL IVPB     2 g 200 mL/hr over 30 Minutes Intravenous Every 12 hours 12/03/18 1115     12/02/18 2000  vancomycin (VANCOCIN) IVPB 1000 mg/200 mL premix     1,000 mg 200 mL/hr over 60 Minutes Intravenous  Once 12/02/18 1043 12/02/18 2130   12/01/18 2200  ceFEPIme (MAXIPIME) 1 g in sodium chloride 0.9 % 100 mL IVPB  Status:  Discontinued     1 g 200 mL/hr over 30 Minutes Intravenous Every 24 hours 12/01/18 1227 12/03/18 1115   12/01/18 1800  ceFEPIme (MAXIPIME) 1 g in sodium chloride 0.9 % 100 mL IVPB  Status:  Discontinued     1 g 200 mL/hr over 30 Minutes Intravenous Every 24 hours 12/02/2018 1831 11/18/2018 2040   12/01/18 0754  vancomycin variable dose per  unstable renal function (pharmacist dosing)  Status:  Discontinued      Does not apply See admin instructions 12/01/18 0755 12/03/18 1115   11/17/2018 1830  vancomycin variable dose per unstable renal function (pharmacist dosing)  Status:  Discontinued      Does not apply See admin instructions 12/02/2018 1831 12/03/2018 2040   12/02/2018 1700  vancomycin (VANCOCIN) 1,750 mg in sodium chloride 0.9 % 500 mL IVPB     1,750 mg 250 mL/hr over 120 Minutes Intravenous  Once 11/17/2018 1646 12/01/2018 2102   12/04/2018 1700  ceFEPIme (MAXIPIME) 2 g in sodium chloride 0.9 % 100 mL IVPB     2 g 200 mL/hr over 30 Minutes Intravenous  Once 11/17/2018 1646 11/26/2018 1754       Subjective/Interval History: Patient intubated and sedated.  Noted to be hypothermic overnight.    Assessment/Plan:  Acute Hypoxic Resp. Failure due to Acute Covid 19 Viral Illness during the ongoing 2020 Covid 19 Pandemic  Vent Mode: PRVC FiO2 (%):  [60 %-100 %] 100 % Set Rate:  [16 bmp] 16 bmp Vt Set:  [430 mL-730 mL] 430 mL PEEP:  [10 cmH20-14 cmH20] 14 cmH20     Component Value Date/Time   PHART 7.481 (H) 12/04/2018 0520   PCO2ART 24.0 (L) 12/04/2018 0520   PO2ART 38.0 (LL) 12/04/2018 0520   HCO3 18.4 (L) 12/04/2018 0520   TCO2 19 (L) 12/04/2018 0520   ACIDBASEDEF 5.0 (H) 12/04/2018 0520   O2SAT 85.0 12/04/2018 0520    COVID-19 Labs  Recent Labs    12/02/18 0526 12/03/18 0249 12/04/18 0245  DDIMER 2.16* 1.61* 2.15*  FERRITIN 622* 898* 1,267*     Fever: Was febrile 2 days ago.  Over the last 12 hours she has been hypothermic Oxygen requirements: Mechanically ventilated.  Currently on 100% FiO2 saturating in the late 80s to 90s. Antibiotics: On vancomycin and cefepime Steroids: On Solu-Medrol 60 mg every 12 hours Diuretics: Not on scheduled diuretics Actemra: 1 dose given on 5/17.  2nd dose to be given today. Convalescent Plasma: Not yet Vitamin C and Zinc: None  Patient remains mechanically ventilated.  Chest  x-ray continues to show dense consolidation in the right upper lobe.  BC was normal.  Patient remains on broad-spectrum antibiotics.  Procalcitonin was noted to be significantly elevated at 7.19.  Patient is on steroids.  Was given Actemra x1 on 5/17.  Ferritin noted to be 1267 this morning.  CRP not checked.  D-dimer 2.15.  The treatment plan and use of medications and known side effects were discussed with patient/family. It was clearly explained that there is no proven definitive treatment for COVID-19 infection yet. Any medications used here are based on case reports/anecdotal data which are not peer-reviewed and has not been studied using randomized control trials.  Complete risks and long-term side effects are unknown, however in the best clinical judgment they seem to be of some clinical benefit rather than medical risks.  Patient/family agree with the treatment plan and want to receive these treatments as indicated.  Patient was subsequently given Actemra.  Hypothermia Most likely due to acute illness/sepsis.  Check cortisol level TSH.  Blood cultures are negative so far.    Acute kidney injury on chronic kidney disease with normal anion gap metabolic acidosis Nephrology is following.  Patient on CRRT.  Monitor urine output.  Septic shock Patient has been on Levophed.  Continue to monitor blood pressures.  Questionable acute blood loss anemia Hemoglobin dropped to 5.4.  Patient was noted to have dark stools.  Unclear if patient truly had GI bleed.  Patient was transfused.  Hemoglobin has been stable subsequently.  Continue to monitor.  Concern for GI bleeding Apparently had dark stool.  Had a drop in hemoglobin requiring blood transfusion.  Greenish colored stool noted in the rectal tube.  Hemoglobin has been stable since transfusion.  If remains stable then tomorrow we can reinstitute DVT prophylaxis.  Continue PPI every 12 hours for now.  History of dementia Supportive  measures.  Nutrition Continue tube feedings.   DVT Prophylaxis: Patient currently on SCDs.  Lab Results  Component Value Date   PLT 129 (L) 12/04/2018     PUD Prophylaxis: PPI every 12 hours Code Status: Full code Family Communication: PCCM to discuss with family Disposition Plan: Unclear.  Continue ICU care.   Medications:  Scheduled: . artificial tears  1 application Both Eyes Q2W  . chlorhexidine gluconate (MEDLINE KIT)  15 mL Mouth Rinse BID  . Chlorhexidine Gluconate Cloth  6 each Topical Q0600  . free water  200 mL Per Tube Q8H  . insulin aspart  2 Units Subcutaneous Q4H  . insulin aspart  3-9 Units Subcutaneous Q4H  . mouth rinse  15 mL Mouth Rinse 10 times per day  . methylPREDNISolone (SOLU-MEDROL) injection  60 mg Intravenous Q6H  . mupirocin ointment  1 application Nasal BID  . pantoprazole (PROTONIX) IV  40 mg Intravenous Q12H   Continuous: .  prismasol BGK 4/2.5 200 mL/hr at 12/03/18 1651  .  prismasol BGK 4/2.5 200 mL/hr at 12/03/18 1652  . ceFEPime (MAXIPIME) IV 2 g (12/04/18 0428)  . feeding supplement (VITAL HIGH PROTEIN) 60 mL/hr at 12/04/18 1211  . fentaNYL infusion INTRAVENOUS 300 mcg/hr (12/04/18 1300)  . heparin 10,000 units/ 20 mL infusion syringe 500 Units/hr (12/04/18 1300)  . midazolam 10 mg/hr (12/04/18 1300)  . norepinephrine (LEVOPHED) Adult infusion 5.013 mcg/min (12/04/18 1313)  . prismasol BGK 4/2.5 1,500 mL/hr at 12/04/18 1308  . vancomycin 1,000 mg (12/03/18 2249)  . vecuronium (NORCURON) infusion 1 mcg/kg/min (12/04/18 1300)   GHW:EXHBZJIRCVELF, fentaNYL, heparin, heparin, midazolam   Objective:  Vital Signs  Vitals:   12/04/18 1230 12/04/18 1245 12/04/18 1300 12/04/18 1315  BP: (!) 142/64 (!) 110/53 (!) 115/51 (!) 103/51  Pulse: 65 61    Resp: 16 (!) 24 (!) 24 (!) 24  Temp:      TempSrc:      SpO2: (!) 87% (!) 87%    Weight:      Height:        Intake/Output Summary (Last 24 hours) at 12/04/2018 1347 Last data filed  at 12/04/2018 1300 Gross per 24 hour  Intake 3668.27 ml  Output 3517 ml  Net 151.27 ml   Filed Weights   11/27/2018 2018 12/03/18 0354 12/04/18 0530  Weight: 109.6 kg 111 kg 115 kg    General appearance: Intubated and sedated Resp: Coarse breath sounds bilaterally.  Crackles bilaterally.  Few rhonchi on the right side.  No wheezing. Cardio: S1-S2 is normal regular.  No S3-S4.  No rubs murmurs or bruit GI: Abdomen is soft.  Nontender nondistended.  Bowel sounds are present normal.  No masses organomegaly Extremities: Minimal edema Neurologic: Sedated.   Lab Results:  Data Reviewed: I have personally reviewed following labs and imaging studies  CBC: Recent Labs  Lab 11/18/2018 1645  11/20/2018 2109 12/01/18 0438 12/02/18 0526  12/03/18 0249 12/03/18 0414 12/03/18 0945 12/04/18 0245 12/04/18 0520  WBC 8.7  --  7.3 7.1 7.8  --  6.0  --   --  6.0  --   NEUTROABS 6.8  --   --   --  6.1  --  4.7  --   --   --   --   HGB 8.1*   < > 7.5* 7.7* 7.7*   < > 9.6* 8.5* 9.7* 11.2*  11.2* 10.9*  HCT 26.7*   < > 24.8* 24.8* 25.6*   < > 29.8* 25.0* 30.0* 33.7*  33.8* 32.0*  MCV 94.7  --  93.6 92.2 93.4  --  87.4  --   --  86.2  --   PLT 185  --  161 163 159  --  144*  --   --  129*  --    < > = values in this interval not displayed.    Basic Metabolic Panel: Recent Labs  Lab 12/11/2018 2109  12/01/18 0844 12/01/18 1658 12/02/18 0526  12/02/18 1752 12/03/18 0249 12/03/18 0414 12/04/18 0245 12/04/18 0520  NA 145   < > 146* 146* 150*   < > 147* 145 146* 140 142  K 5.4*   < > 5.4* 5.6* 5.5*   < > 5.3* 4.9 5.0 4.1 4.0  CL 119*   < > 123* 122* 124*  --   --  118*  --  109  --   CO2 16*   < > 14* 13* 14*  --   --  15*  --  20*  --  GLUCOSE 111*   < > 109* 122* 127*  --   --  250*  --  264*  --   BUN 73*   < > 68* 72* 81*  --   --  82*  --  60*  --   CREATININE 5.90*   < > 6.04* 6.34* 6.55*  --   --  6.62*  --  3.98*  --   CALCIUM 7.5*   < > 7.6* 7.8* 8.1*  --   --  7.4*  --  7.3*  --    MG 2.1  --   --   --  2.2  --   --  2.2  --  2.3  --   PHOS 4.7*  --   --   --  5.5*  --   --  5.3*  --  3.4  --    < > = values in this interval not displayed.    GFR: Estimated Creatinine Clearance: 15 mL/min (A) (by C-G formula based on SCr of 3.98 mg/dL (H)).  Liver Function Tests: Recent Labs  Lab 12/14/2018 1645 12/02/18 0526  AST 35  --   ALT 18  --   ALKPHOS 183*  --   BILITOT 0.4  --   PROT 6.5  --   ALBUMIN 2.7* 2.4*     Coagulation Profile: Recent Labs  Lab 12/03/18 0945  INR 1.1    Cardiac Enzymes: Recent Labs  Lab 11/24/2018 1645  TROPONINI 0.11*     CBG: Recent Labs  Lab 12/03/18 2002 12/03/18 2320 12/04/18 0413 12/04/18 0742 12/04/18 1130  GLUCAP 159* 240* 238* 238* 206*    Anemia Panel: Recent Labs    12/02/18 0526 12/03/18 0249 12/04/18 0245  VITAMINB12 2,373*  --   --   FOLATE 10.4  --   --   FERRITIN 622* 898* 1,267*  TIBC 204*  --   --   IRON 25*  --   --     Recent Results (from the past 240 hour(s))  Blood Culture (routine x 2)     Status: None (Preliminary result)   Collection Time: 12/14/2018  4:45 PM  Result Value Ref Range Status   Specimen Description BLOOD RIGHT HAND  Final   Special Requests   Final    BOTTLES DRAWN AEROBIC AND ANAEROBIC Blood Culture adequate volume   Culture   Final    NO GROWTH 4 DAYS Performed at [facility], [place] [address] [place], [place] [address]    Report Status PENDING  Incomplete  Blood Culture (routine x 2)     Status: None (Preliminary result)   Collection Time: 11/21/2018  5:10 PM  Result Value Ref Range Status   Specimen Description BLOOD LEFT HAND  Final   Special Requests   Final    BOTTLES DRAWN AEROBIC ONLY Blood Culture results may not be optimal due to an inadequate volume of blood received in culture bottles   Culture   Final    NO GROWTH 4 DAYS Performed at [facility], [place] [address], [place], [place] [address]    Report Status PENDING  Incomplete  Urine  culture     Status: Abnormal   Collection Time: 12/04/2018  5:28 PM  Result Value Ref Range Status   Specimen Description URINE, CATHETERIZED  Final   Special Requests NONE  Final   Culture (A)  Final    <10,000 COLONIES/mL INSIGNIFICANT GROWTH Performed at [facility],  [address], [place], [place] [address]    Report Status 12/01/2018 FINAL  Final  MRSA PCR Screening     Status: Abnormal   Collection Time: 12/02/18 11:21 PM  Result Value Ref Range Status   MRSA by PCR POSITIVE (A) NEGATIVE Final    Comment:        The GeneXpert MRSA Assay (FDA approved for NASAL specimens only), is one component of a comprehensive MRSA colonization surveillance program. It is not intended to diagnose MRSA infection nor to guide or monitor treatment for MRSA infections. RESULT CALLED TO, READ BACK BY AND VERIFIED WITH: Crista Elliot RN @ [phone] ON 12/03/18 BY ROBINSON Z.  Performed at [facility], [place] [address] [place], [place] [address]   Culture, blood (routine x 2)     Status: None (Preliminary result)   Collection Time: 12/03/18  2:30 AM  Result Value Ref Range Status   Specimen Description BLOOD LEFT ANTECUBITAL  Final   Special Requests   Final    BOTTLES DRAWN AEROBIC ONLY Blood Culture adequate volume   Culture   Final    NO GROWTH 1 DAY Performed at [facility], [place] [address], [place], [place] [address]    Report Status PENDING  Incomplete  Culture, blood (routine x 2)     Status: None (Preliminary result)   Collection Time: 12/03/18  2:45 AM  Result Value Ref Range Status   Specimen Description BLOOD LEFT HAND  Final   Special Requests   Final    BOTTLES DRAWN AEROBIC ONLY Blood Culture adequate volume   Culture   Final    NO GROWTH 1 DAY Performed at [facility], [place] [address], [place], [place] [address]    Report Status PENDING  Incomplete      Radiology Studies: Dg Chest Port 1 View  Result Date: 12/04/2018 CLINICAL DATA:  Endotracheal  tube EXAM: PORTABLE CHEST 1 VIEW COMPARISON:  Yesterday FINDINGS: Endotracheal tube tip at the clavicular heads. Bilateral central line with tip at the upper cavoatrial junction. The orogastric tube at least reaches the stomach. Bilateral airspace disease with dense consolidation in the right upper lobe. No effusion or pneumothorax. Stable heart size. IMPRESSION: Stable hardware positioning and extensive airspace disease. Electronically Signed   By: Monte Fantasia M.D.   On: 12/04/2018 07:35   Dg Chest Port 1 View  Result Date: 12/03/2018 CLINICAL DATA:  See evaluate central venous catheter placement EXAM: PORTABLE CHEST 1 VIEW COMPARISON:  12/02/2018 FINDINGS: ET tube tip is above the carina. There is a right IJ catheter with tip in the projection of the cavoatrial junction. Interval placement of left IJ catheter. Tip is also in the projection of the cavoatrial junction. No pneumothorax identified. Enteric tube tip is below the level of the GE junction. There is been significant interval worsening of aeration to the right upper lobe compared with previous exam. Patchy airspace densities within the right lower lobe and left lung appears similar. IMPRESSION: 1. Left IJ catheter tip projects over the expected location of the cavoatrial junction. No pneumothorax. 2. Significant worsening aeration to the right upper lobe. Electronically Signed   By: Kerby Moors M.D.   On: 12/03/2018 13:33   Dg Chest Port 1 View  Result Date: 12/02/2018 CLINICAL DATA:  Respiratory failure. EXAM: PORTABLE CHEST 1 VIEW COMPARISON:  Radiographs earlier this day. FINDINGS: Endotracheal tube tip is 16 mm from the carina. Tip and side port of the enteric tube  below the diaphragm in the stomach. Right internal jugular central venous catheter tip in the atrial caval junction. Patient is rotated. Multifocal airspace opacities, right greater than left, slightly progressed in the upper lung zones. Unchanged heart size and mediastinal  contours allowing for rotation. No large pleural effusion. No pneumothorax. IMPRESSION: 1. Slightly radiographic progression of multifocal Covid pneumonia, right greater than left. 2. Endotracheal tube tip borderline low 16 mm from the carina. Electronically Signed   By: Keith Rake M.D.   On: 12/02/2018 21:46       LOS: 4 days   [facility] Pager on [email or web address]  12/04/2018, 1:47 PM

## 2018-12-04 NOTE — Progress Notes (Signed)
Spoke with patients daughter via phone call  and updated her with patients condition.

## 2018-12-04 NOTE — Progress Notes (Signed)
Patient currently on paralytic, tube feeds turned off.

## 2018-12-04 NOTE — Procedures (Signed)
Arterial Catheter Insertion Procedure Note Julia Thomas [id number] Jun 14, 1941  Procedure: Insertion of Arterial Catheter  Indications: Blood pressure monitoring and Frequent blood sampling  Procedure Details Consent: Unable to obtain consent because of emergent medical necessity. Time Out: Verified patient identification, verified procedure, site/side was marked, verified correct patient position, special equipment/implants available, medications/allergies/relevent history reviewed, required imaging and test results available.  Performed  Maximum sterile technique was used including antiseptics, cap, gloves, gown, hand hygiene, mask and sheet. Skin prep: Chlorhexidine; local anesthetic administered 20 gauge catheter was inserted into left radial artery using the Seldinger technique. ULTRASOUND GUIDANCE USED: NO Evaluation Blood flow good; BP tracing good. Complications: No apparent complications.   Rayburn Felt 12/04/2018

## 2018-12-04 NOTE — Progress Notes (Signed)
[facility] Progress Note   Subjective:   Transferred to [facility].  Hypothermic- BP soft, currently off pressors - no machine issues - a little urine   Objective Vitals:   12/04/18 0700 12/04/18 0800 12/04/18 0900 12/04/18 1000  BP: 114/73 108/80 115/65 112/73  Pulse: (!) 53 (!) 53 (!) 57 (!) 54  Resp: 18 19 (!) 22 16  Temp: (!) 94.8 F (34.9 C) (!) 95.4 F (35.2 C) (!) 95.4 F (35.2 C) (!) 95.5 F (35.3 C)  TempSrc: Esophageal     SpO2: 96% 96% 98% 98%  Weight:      Height:       Physical Exam Intubated and sedated currently on 100% FIO2, PEEP 6.  Remainder of exam deferred in light of COVID19 mitigation strategies.  Discussed with RN in ICU.  Additional Objective Labs: Basic Metabolic Panel: Recent Labs  Lab 12/02/18 0526  12/03/18 0249 12/03/18 0414 12/04/18 0245 12/04/18 0520  NA 150*   < > 145 146* 140 142  K 5.5*   < > 4.9 5.0 4.1 4.0  CL 124*  --  118*  --  109  --   CO2 14*  --  15*  --  20*  --   GLUCOSE 127*  --  250*  --  264*  --   BUN 81*  --  82*  --  60*  --   CREATININE 6.55*  --  6.62*  --  3.98*  --   CALCIUM 8.1*  --  7.4*  --  7.3*  --   PHOS 5.5*  --  5.3*  --  3.4  --    < > = values in this interval not displayed.   Liver Function Tests: Recent Labs  Lab 12/17/2018 1645 12/02/18 0526  AST 35  --   ALT 18  --   ALKPHOS 183*  --   BILITOT 0.4  --   PROT 6.5  --   ALBUMIN 2.7* 2.4*   No results for input(s): LIPASE, AMYLASE in the last 168 hours. CBC: Recent Labs  Lab 12/05/2018 1645  12/03/2018 2109 12/01/18 0438 12/02/18 0526  12/03/18 0249  12/03/18 0945 12/04/18 0245 12/04/18 0520  WBC 8.7  --  7.3 7.1 7.8  --  6.0  --   --  6.0  --   NEUTROABS 6.8  --   --   --  6.1  --  4.7  --   --   --   --   HGB 8.1*   < > 7.5* 7.7* 7.7*   < > 9.6*   < > 9.7* 11.2*  11.2* 10.9*  HCT 26.7*   < > 24.8* 24.8* 25.6*   < > 29.8*   < > 30.0* 33.7*  33.8* 32.0*  MCV 94.7  --  93.6 92.2 93.4  --  87.4  --   --  86.2  --    PLT 185  --  161 163 159  --  144*  --   --  129*  --    < > = values in this interval not displayed.   Blood Culture    Component Value Date/Time   SDES BLOOD LEFT HAND 12/03/2018 0245   SPECREQUEST  12/03/2018 0245    BOTTLES DRAWN AEROBIC ONLY Blood Culture adequate volume   CULT  12/03/2018 0245    NO GROWTH 1 DAY Performed at [facility], [place] [address], [place], [place] [address]  REPTSTATUS PENDING 12/03/2018 0245    Cardiac Enzymes: Recent Labs  Lab 12/02/2018 1645  TROPONINI 0.11*   CBG: Recent Labs  Lab 12/03/18 1557 12/03/18 2002 12/03/18 2320 12/04/18 0413 12/04/18 0742  GLUCAP 143* 159* 240* 238* 238*   Iron Studies:  Recent Labs    12/02/18 0526  12/04/18 0245  IRON 25*  --   --   TIBC 204*  --   --   FERRITIN 622*   < > 1,267*   < > = values in this interval not displayed.   @[email or web address] @ Studies/Results: Dg Abd 1 View  Result Date: 12/02/2018 CLINICAL DATA:  Bedside nasogastric tube placement. EXAM: ABDOMEN - 1 VIEW COMPARISON:  CT abdomen and pelvis yesterday. Abdominal x-ray 12/02/2018. FINDINGS: Nasogastric tube looped in the stomach with its tip at the expected location of the gastric body. Visualized bowel gas pattern nonobstructive and unchanged. IMPRESSION: Nasogastric tube looped in the stomach with its tip at the expected location of the gastric body. Electronically Signed   By: Evangeline Dakin M.D.   On: 12/02/2018 14:17   Dg Chest Port 1 View  Result Date: 12/04/2018 CLINICAL DATA:  Endotracheal tube EXAM: PORTABLE CHEST 1 VIEW COMPARISON:  Yesterday FINDINGS: Endotracheal tube tip at the clavicular heads. Bilateral central line with tip at the upper cavoatrial junction. The orogastric tube at least reaches the stomach. Bilateral airspace disease with dense consolidation in the right upper lobe. No effusion or pneumothorax. Stable heart size. IMPRESSION: Stable hardware positioning and extensive airspace disease. Electronically  Signed   By: Monte Fantasia M.D.   On: 12/04/2018 07:35   Dg Chest Port 1 View  Result Date: 12/03/2018 CLINICAL DATA:  See evaluate central venous catheter placement EXAM: PORTABLE CHEST 1 VIEW COMPARISON:  12/02/2018 FINDINGS: ET tube tip is above the carina. There is a right IJ catheter with tip in the projection of the cavoatrial junction. Interval placement of left IJ catheter. Tip is also in the projection of the cavoatrial junction. No pneumothorax identified. Enteric tube tip is below the level of the GE junction. There is been significant interval worsening of aeration to the right upper lobe compared with previous exam. Patchy airspace densities within the right lower lobe and left lung appears similar. IMPRESSION: 1. Left IJ catheter tip projects over the expected location of the cavoatrial junction. No pneumothorax. 2. Significant worsening aeration to the right upper lobe. Electronically Signed   By: Kerby Moors M.D.   On: 12/03/2018 13:33   Dg Chest Port 1 View  Result Date: 12/02/2018 CLINICAL DATA:  Respiratory failure. EXAM: PORTABLE CHEST 1 VIEW COMPARISON:  Radiographs earlier this day. FINDINGS: Endotracheal tube tip is 16 mm from the carina. Tip and side port of the enteric tube below the diaphragm in the stomach. Right internal jugular central venous catheter tip in the atrial caval junction. Patient is rotated. Multifocal airspace opacities, right greater than left, slightly progressed in the upper lung zones. Unchanged heart size and mediastinal contours allowing for rotation. No large pleural effusion. No pneumothorax. IMPRESSION: 1. Slightly radiographic progression of multifocal Covid pneumonia, right greater than left. 2. Endotracheal tube tip borderline low 16 mm from the carina. Electronically Signed   By: Keith Rake M.D.   On: 12/02/2018 21:46   Dg Chest Port 1 View  Result Date: 12/02/2018 CLINICAL DATA:  Ventilator dependent respiratory failure. Current history  of COVID-19 pneumonia. EXAM: PORTABLE CHEST 1 VIEW COMPARISON:  Portable chest x-ray earlier same day at 4:04 a.m.  and on 12/14/2018. FINDINGS: Endotracheal tube tip projects approximately 2-3 cm above the carina. RIGHT jugular central venous catheter tip projects at or near the cavoatrial junction. Nasogastric tube courses below the diaphragm into the stomach. Since the examination earlier same day, no significant change in the airspace opacities throughout the RIGHT lung, at the LEFT lung base, and in the LEFT mid lung. No new pulmonary parenchymal abnormalities. IMPRESSION: 1. Support apparatus satisfactory. 2. Stable pneumonia throughout the RIGHT lung, the LEFT lung base and the LEFT mid lung. 3. No new abnormalities. Electronically Signed   By: Evangeline Dakin M.D.   On: 12/02/2018 14:15   Medications: .  prismasol BGK 4/2.5 200 mL/hr at 12/03/18 1651  .  prismasol BGK 4/2.5 200 mL/hr at 12/03/18 1652  . ceFEPime (MAXIPIME) IV 2 g (12/04/18 0428)  . fentaNYL infusion INTRAVENOUS    . heparin 10,000 units/ 20 mL infusion syringe 500 Units/hr (12/04/18 0700)  . midazolam    . prismasol BGK 4/2.5 1,500 mL/hr at 12/04/18 0944  . vancomycin 1,000 mg (12/03/18 2249)  . vecuronium (NORCURON) infusion     . artificial tears  1 application Both Eyes E5U  . chlorhexidine gluconate (MEDLINE KIT)  15 mL Mouth Rinse BID  . Chlorhexidine Gluconate Cloth  6 each Topical Q0600  . feeding supplement (PRO-STAT SUGAR FREE 64)  30 mL Per Tube BID  . feeding supplement (VITAL HIGH PROTEIN)  1,000 mL Per Tube Q24H  . fentaNYL (SUBLIMAZE) injection  25 mcg Intravenous Once  . free water  200 mL Per Tube Q8H  . insulin aspart  2 Units Subcutaneous Q4H  . insulin aspart  3-9 Units Subcutaneous Q4H  . mouth rinse  15 mL Mouth Rinse 10 times per day  . methylPREDNISolone (SOLU-MEDROL) injection  60 mg Intravenous Q6H  . mupirocin ointment  1 application Nasal BID  . pantoprazole (PROTONIX) IV  40 mg Intravenous  Q12H  . sodium bicarbonate  1,300 mg Per Tube TID  . vecuronium  0.08 mg/kg Intravenous Once    Assessment/Plan: 1.  Hypoxic respiratory failure secondary to COVID 19 +/- bacterial PNA:  Clinically worsening and now intubated.  HCAP antibiotics per primary, dose vanc for renal function.  CXR with worsening infiltrates  Running even with CRRT- cont for now  2.  Sepsis secondary to #1:  Care per above.    3.  AKI on CKD: unknown baseline but per staff at [facility] 10/2018 Cr 3.5, now with severe AKI in setting of #1 and 2 above.  Suspect ATN type picture in setting of #1 and 2 above.  AKI is quite common with COVID 19 and proteinuria is a frequent feature, though she has underlying DM and could have assoc proteinuria at baseline.  CT without obstruction. Making a little urine  Placed on CRRT on 5/17 via temp cath  - machine running well so far  4.  Hyperkalemia: mild, on lokelma prior, now stopped and on CRRT - all 4 K dialysate  5.  Non anion gap metabolic acidosis secondary to AKI:  Bicarb gtt 50/hr + oral bicarb.  Improving.  Can stop all bicarb as acidosis will be controlled with CRRT      6.  Hypernatremia:  Resolved with addition of FWF. CTM.   7.  Anemia:  Hb on presentation 8.1 > 7.7 > 5.4 > transfusion 2u > 9.6 > 8.5.  PPI initiated after dark stool noted.  Now much better.  Iron sat 12% but hold on  IV iron in setting of infection. Normal B12, folate.    Julia Thomas

## 2018-12-04 NOTE — Progress Notes (Addendum)
Initial Nutrition Assessment RD working remotely.  DOCUMENTATION CODES:   Morbid obesity  INTERVENTION:    Increase Vital High Protein to goal rate of 60 ml/h (1440 ml per day)   Provides 1440 kcal, 126 gm protein, 1204 ml free water daily  NUTRITION DIAGNOSIS:   Inadequate oral intake related to inability to eat as evidenced by NPO status.  GOAL:   Provide needs based on ASPEN/SCCM guidelines  MONITOR:   Vent status, TF tolerance, Labs, Skin, I & O's  REASON FOR ASSESSMENT:   Ventilator, Consult Enteral/tube feeding initiation and management  ASSESSMENT:   78 yo female with PMH of right BKA, DM, CKD, CHF, HTN, HLD, dementia, aphasia, retinopathy who was admitted with AKI, PNA r/t recent COVID-19 diagnosis, respiratory failure requiring intubation. Transferred to [facility] 5/17.  Received MD Consult for TF initiation and management. OGT in place. Adult tube feeding protocol has been initiated. Currently receiving Vital High Protein at 40 ml/h with Pro-stat 30 ml BID to provide 1160 kcal, 114 gm protein, 803 ml free water daily. Also receiving free water flushes 200 ml every 8 hours.   Currently requiring CRRT.   Patient is currently intubated on ventilator support Temp (24hrs), Avg:93.7 F (34.3 C), Min:88.9 F (31.6 C), Max:98.6 F (37 C)   Labs reviewed. BUN 60 (H), creatinine 3.98 (H) CBG's: 238-238  Medications reviewed and include Novolog, Solumedrol.  Weight up 5.4 kg since admission I/O net positive 6.1 L  Per RN documentation patient has mild generalized edema and mild edema to BUE, LLE, and face.  NUTRITION - FOCUSED PHYSICAL EXAM:  unable to complete  Diet Order:   Diet Order            Diet NPO time specified  Diet effective now              EDUCATION NEEDS:   No education needs have been identified at this time  Skin:  Skin Assessment: Reviewed RN Assessment  Last BM:  5/17 (type 6)  Height:   Ht Readings from Last 1 Encounters:   12/08/2018 5\' 4"  (1.626 m)    Weight:   Wt Readings from Last 1 Encounters:  12/04/18 115 kg    Ideal Body Weight:  50.6 kg (adjusted for BKA)  BMI:  46.5 (adjusted for BKA)  Estimated Nutritional Needs:   Kcal:  1265-1610  Protein:  126 gm  Fluid:  >/= 1.6 L    Joaquin Courts, RD, LDN, CNSC Pager [phone] After Hours Pager [phone]

## 2018-12-04 NOTE — Progress Notes (Addendum)
NAME:  Julia Thomas, MRN:  [id number], DOB:  10/24/1940, LOS: 4 ADMISSION DATE:  11/26/2018, CONSULTATION DATE: 12/02/2018 REFERRING MD: Dr. Ronaldo MiyamotoKyle, CHIEF COMPLAINT: Respiratory failure  Brief History   78 w DM, HTN + CHF, dementia. Admitted from SNF COVID positive with progressive respiratory failure, acute on chronic respiratory failure. Mechanically ventilated. Initiated CVVHD 5/17.   History of present illness   Patient is a 78 year old African-American female, skilled nursing facility resident, with past medical history significant for recently positive test for COVID 19 3 days ago, chronic kidney disease with unknown baseline, diabetes mellitus, hypertension, CHF, dementia amongst other past medical and cardiac history.  Patient is unable to give any significant history.  Over the last several days, patient is said to have developed decreased p.o. intake.  BMP MP done at the skilled nursing facility revealed that the patient had developed acute kidney injury, hence, the decision to transfer patient to the hospital for further assessment and management.  On presentation to the hospital, BUN was noted to be 72, weight serum creatinine of 5.9, potassium of 5.6 and CO2 of 13.  Patient was saturating at 100% on 2 L supplemental oxygen.    Now requiring nonrebreather mask, more confused, agitated-transferred to ICU patient was admitted for acute kidney injury  Significant Hospital Events   Acute blood loss anemia, 2u PRBC 5/16 VDRF 5/16  [facility]  Consults:  Renal 12/01/2018 PCCM 12/02/2018  Procedures:  ETT 5/16 >>  L IJ HD cath 5/17 >>   Significant Diagnostic Tests:  Chest x-ray-multifocal infiltrate  Micro Data:  Blood cultures 5/14  Antimicrobials / COVID therapies:  Cefepime 5/14>> Vancomycin 5/14>>  Actemra 5/17 Steroids 5/17 >>  Paralytics 5/18 >>    Interim history/subjective:  CVVHD initiated yesterday Actemra given yesterday Escalating PEEP and  Fio2 needs last 12 hours; pt breathing over set rate, on precedex and fentanyl prn.    Objective   Blood pressure (!) 176/161, pulse 65, temperature (!) 88.9 F (31.6 C), resp. rate (!) 0, height 5\' 4"  (1.626 m), weight 115 kg, SpO2 (!) 74 %.    Vent Mode: PRVC FiO2 (%):  [60 %-100 %] 100 % Set Rate:  [16 bmp] 16 bmp Vt Set:  [430 mL-730 mL] 430 mL PEEP:  [10 cmH20-14 cmH20] 14 cmH20   Intake/Output Summary (Last 24 hours) at 12/04/2018 1308 Last data filed at 12/04/2018 1300 Gross per 24 hour  Intake 3668.27 ml  Output 3517 ml  Net 151.27 ml   Filed Weights   11/25/2018 2018 12/03/18 0354 12/04/18 0530  Weight: 109.6 kg 111 kg 115 kg    Examination: General: Ill-appearing elderly woman, mechanically ventilated, sedated HENT: ET tube and OG tube in place.  Oropharynx otherwise clear, pupils equal Lungs: Coarse bilaterally, some dyssynchronous respiratory effort Cardiovascular: Regular, no murmur Abdomen: Soft, nondistended with positive bowel sounds Extremities: Right lower extremity amputation, no significant edema Neuro: Grimace with pain, not following commands, moves upper extremities   Resolved Hospital Problem list   Metabolic acidosis  Assessment & Plan:  COVID infection Continue scheduled solumedrol actemra given 5/17 Not a candidate remdesivir due to renal failure Deferring high dose heparinoids for now given concern for possible GI bleeding at presentation.  Follow c-RP, ferritin, troponin, IL-6, d-dimer, LDH, LFT Consider convalescent plasma therapy   Acute hypoxemic respiratory failure / ARDS Pt currently dysynchronous, on 8cc/kg Vt, increasing PEEP and Fio2 needs over last 12 h Will increase sedation now Initiate paralytics now,  anticipate 24-72h Recruitment maneuver and titrate up PEEP if needed Consider proning depending on response to the above Negative fluid balance as possible through CVVHD Follow ABG with vent changes Follow CXR VAP prevention  order set  Multilobar pneumonia, viral + possible superimposed bacterial PNA Continue empiric abx as ordered, vanco + cefepime Follow cx data  Shock, presumed septic shock Norepi has been weaned to off, may have to restart as sedation is being increased.  CVP to guide volume status Anti-HTN meds held  Acute kidney injury on chronic kidney disease Continue CVVHD Appreciate nephrology management Follow UOP Stop bicarb gtt 5/18  History of dementia Sedation  Increased sedation 5/18  Suspected GI bleeding, appears to have resolved Follow intermittent CBC protonix q12h Consider GI eval if evidence recurrence  Nutrition TF ordered, tolerating   Best practice:  Diet: TF Pain/Anxiety/Delirium protocol (if indicated): RASS goal -5, fentanyl + versed VAP protocol (if indicated): In place DVT prophylaxis: SCD GI prophylaxis: Protonix bid Glucose control: SSI Mobility: Bedrest Code Status: Full code Family Communication: Discussed w daughter [name] 5/17, will contact again 5/18 Disposition: ICU  Labs   CBC: Recent Labs  Lab 2018/12/21 1645  12-21-18 2109 12/01/18 0438 12/02/18 0526  12/03/18 0249 12/03/18 0414 12/03/18 0945 12/04/18 0245 12/04/18 0520  WBC 8.7  --  7.3 7.1 7.8  --  6.0  --   --  6.0  --   NEUTROABS 6.8  --   --   --  6.1  --  4.7  --   --   --   --   HGB 8.1*   < > 7.5* 7.7* 7.7*   < > 9.6* 8.5* 9.7* 11.2*  11.2* 10.9*  HCT 26.7*   < > 24.8* 24.8* 25.6*   < > 29.8* 25.0* 30.0* 33.7*  33.8* 32.0*  MCV 94.7  --  93.6 92.2 93.4  --  87.4  --   --  86.2  --   PLT 185  --  161 163 159  --  144*  --   --  129*  --    < > = values in this interval not displayed.    Basic Metabolic Panel: Recent Labs  Lab 21-Dec-2018 2109  12/01/18 0844 12/01/18 1658 12/02/18 0526  12/02/18 1752 12/03/18 0249 12/03/18 0414 12/04/18 0245 12/04/18 0520  NA 145   < > 146* 146* 150*   < > 147* 145 146* 140 142  K 5.4*   < > 5.4* 5.6* 5.5*   < > 5.3* 4.9 5.0 4.1 4.0   CL 119*   < > 123* 122* 124*  --   --  118*  --  109  --   CO2 16*   < > 14* 13* 14*  --   --  15*  --  20*  --   GLUCOSE 111*   < > 109* 122* 127*  --   --  250*  --  264*  --   BUN 73*   < > 68* 72* 81*  --   --  82*  --  60*  --   CREATININE 5.90*   < > 6.04* 6.34* 6.55*  --   --  6.62*  --  3.98*  --   CALCIUM 7.5*   < > 7.6* 7.8* 8.1*  --   --  7.4*  --  7.3*  --   MG 2.1  --   --   --  2.2  --   --  2.2  --  2.3  --   PHOS 4.7*  --   --   --  5.5*  --   --  5.3*  --  3.4  --    < > = values in this interval not displayed.   GFR: Estimated Creatinine Clearance: 15 mL/min (A) (by C-G formula based on SCr of 3.98 mg/dL (H)). Recent Labs  Lab 12/02/2018 1627  11/18/2018 1645 12/09/2018 1735 12/14/2018 2109 12/01/18 0438 12/01/18 0615 12/02/18 0526 12/03/18 0249 12/04/18 0245  PROCALCITON  --   --  6.78  --  7.19  --   --   --   --   --   WBC  --    < > 8.7  --  7.3 7.1  --  7.8 6.0 6.0  LATICACIDVEN 1.3  --   --  1.1  --   --  0.9  --  0.9  --    < > = values in this interval not displayed.    Liver Function Tests: Recent Labs  Lab 12/09/2018 1645 12/02/18 0526  AST 35  --   ALT 18  --   ALKPHOS 183*  --   BILITOT 0.4  --   PROT 6.5  --   ALBUMIN 2.7* 2.4*   No results for input(s): LIPASE, AMYLASE in the last 168 hours. No results for input(s): AMMONIA in the last 168 hours.  ABG    Component Value Date/Time   PHART 7.481 (H) 12/04/2018 0520   PCO2ART 24.0 (L) 12/04/2018 0520   PO2ART 38.0 (LL) 12/04/2018 0520   HCO3 18.4 (L) 12/04/2018 0520   TCO2 19 (L) 12/04/2018 0520   ACIDBASEDEF 5.0 (H) 12/04/2018 0520   O2SAT 85.0 12/04/2018 0520     Coagulation Profile: Recent Labs  Lab 12/03/18 0945  INR 1.1    Cardiac Enzymes: Recent Labs  Lab 11/22/2018 1645  TROPONINI 0.11*    HbA1C: No results found for: HGBA1C  CBG: Recent Labs  Lab 12/03/18 2002 12/03/18 2320 12/04/18 0413 12/04/18 0742 12/04/18 1130  GLUCAP 159* 240* 238* 238* 206*    Independent critical care time 35 minutes   Levy Pupa, MD, PhD 12/04/2018, 1:42 PM [facility] [phone] or if no answer [phone]

## 2018-12-04 NOTE — Progress Notes (Signed)
PCCM Interval Note  Reviewed ABG. P/F ratio 88 despite paralytic use. Will plan to prone after TF turned off for one hour.  Mechele Collin, M.D. [facility]monary/Critical Care Medicine Pager: [phone] After hours pager: [phone]

## 2018-12-04 NOTE — Progress Notes (Signed)
Esophageal temp probe placed and bair hugger applied to patient

## 2018-12-04 NOTE — Progress Notes (Addendum)
Pt's heart rate 44, b/p 116/76. Notified Dr Darrick Penna and Vinnie Langton RN at e-link reguarding  heart rate. Will continue to monitor, no new orders at this time

## 2018-12-04 NOTE — Progress Notes (Signed)
Unable to get temp on pt, no rectal thermometers or bair huggers available. Blood warmer on crrt machine and extra blankets placed on pt.

## 2018-12-04 NOTE — Significant Event (Signed)
PCCM Interval Note  I spoke with the patient's daughter Thelma Barge by phone.  Explained the patient's condition, the interventions that we are undertaking, her prognosis.  I have explained that Ms. Lickteig remains somewhat unstable, tenuous.  Thelma Barge understands the situation.  I recommended that we continue all aggressive support but that we strongly consider deferring ACLS/CPR if the patient were to acutely decline.  Thelma Barge understands the rationale for this and agrees.  She would like to speak with her siblings to explain this information, let them process and have input.  I will not change CODE STATUS at this time but we will plan to revisit in the near future.  Levy Pupa, MD, PhD 12/04/2018, 3:43 PM [facility] [phone] or if no answer [phone]

## 2018-12-05 DIAGNOSIS — J80 Acute respiratory distress syndrome: Secondary | ICD-10-CM

## 2018-12-05 DIAGNOSIS — U071 COVID-19: Secondary | ICD-10-CM

## 2018-12-05 LAB — CULTURE, BLOOD (ROUTINE X 2)
Culture: NO GROWTH
Culture: NO GROWTH
Special Requests: ADEQUATE

## 2018-12-05 LAB — RENAL FUNCTION PANEL
Albumin: 2.2 g/dL — ABNORMAL LOW (ref 3.5–5.0)
Albumin: 2.4 g/dL — ABNORMAL LOW (ref 3.5–5.0)
Anion gap: 10 (ref 5–15)
Anion gap: 10 (ref 5–15)
BUN: 45 mg/dL — ABNORMAL HIGH (ref 8–23)
BUN: 34 mg/dL — ABNORMAL HIGH (ref 8–23)
CO2: 24 mmol/L (ref 22–32)
CO2: 23 mmol/L (ref 22–32)
Calcium: 7.3 mg/dL — ABNORMAL LOW (ref 8.9–10.3)
Calcium: 7.5 mg/dL — ABNORMAL LOW (ref 8.9–10.3)
Chloride: 106 mmol/L (ref 98–111)
Chloride: 106 mmol/L (ref 98–111)
Creatinine, Ser: 2.57 mg/dL — ABNORMAL HIGH (ref 0.44–1.00)
Creatinine, Ser: 2.11 mg/dL — ABNORMAL HIGH (ref 0.44–1.00)
GFR calc Af Amer: 20 mL/min — ABNORMAL LOW (ref >60)
GFR calc Af Amer: 26 mL/min — ABNORMAL LOW (ref >60)
GFR calc non Af Amer: 17 mL/min — ABNORMAL LOW (ref >60)
GFR calc non Af Amer: 22 mL/min — ABNORMAL LOW (ref >60)
Glucose, Bld: 194 mg/dL — ABNORMAL HIGH (ref 70–99)
Glucose, Bld: 164 mg/dL — ABNORMAL HIGH (ref 70–99)
Phosphorus: 3.9 mg/dL (ref 2.5–4.6)
Phosphorus: 3.6 mg/dL (ref 2.5–4.6)
Potassium: 4.2 mmol/L (ref 3.5–5.1)
Potassium: 4.4 mmol/L (ref 3.5–5.1)
Sodium: 140 mmol/L (ref 135–145)
Sodium: 139 mmol/L (ref 135–145)

## 2018-12-05 LAB — CBC WITH DIFFERENTIAL/PLATELET
Abs Immature Granulocytes: 0.67 10*3/uL — ABNORMAL HIGH (ref 0.00–0.07)
Basophils Absolute: 0 10*3/uL (ref 0.0–0.1)
Basophils Relative: 0 %
Eosinophils Absolute: 0 10*3/uL (ref 0.0–0.5)
Eosinophils Relative: 0 %
HCT: 35 % — ABNORMAL LOW (ref 36.0–46.0)
Hemoglobin: 11.4 g/dL — ABNORMAL LOW (ref 12.0–15.0)
Immature Granulocytes: 7 %
Lymphocytes Relative: 12 %
Lymphs Abs: 1.2 10*3/uL (ref 0.7–4.0)
MCH: 28.4 pg (ref 26.0–34.0)
MCHC: 32.6 g/dL (ref 30.0–36.0)
MCV: 87.1 fL (ref 80.0–100.0)
Monocytes Absolute: 0.3 10*3/uL (ref 0.1–1.0)
Monocytes Relative: 3 %
Neutro Abs: 8.1 10*3/uL — ABNORMAL HIGH (ref 1.7–7.7)
Neutrophils Relative %: 78 %
Platelets: 131 10*3/uL — ABNORMAL LOW (ref 150–400)
RBC: 4.02 MIL/uL (ref 3.87–5.11)
RDW: 15.9 % — ABNORMAL HIGH (ref 11.5–15.5)
WBC: 10.3 10*3/uL (ref 4.0–10.5)
nRBC: 0.4 % — ABNORMAL HIGH (ref 0.0–0.2)

## 2018-12-05 LAB — TSH: TSH: 0.156 u[IU]/mL — ABNORMAL LOW (ref 0.350–4.500)

## 2018-12-05 LAB — PROCALCITONIN: Procalcitonin: 13.4 ng/mL

## 2018-12-05 LAB — GLUCOSE, CAPILLARY
Glucose-Capillary: 185 mg/dL — ABNORMAL HIGH (ref 70–99)
Glucose-Capillary: 152 mg/dL — ABNORMAL HIGH (ref 70–99)
Glucose-Capillary: 149 mg/dL — ABNORMAL HIGH (ref 70–99)
Glucose-Capillary: 158 mg/dL — ABNORMAL HIGH (ref 70–99)
Glucose-Capillary: 139 mg/dL — ABNORMAL HIGH (ref 70–99)
Glucose-Capillary: 168 mg/dL — ABNORMAL HIGH (ref 70–99)
Glucose-Capillary: 191 mg/dL — ABNORMAL HIGH (ref 70–99)

## 2018-12-05 LAB — POCT I-STAT 7, (LYTES, BLD GAS, ICA,H+H)
Acid-base deficit: 3 mmol/L — ABNORMAL HIGH (ref 0.0–2.0)
Acid-base deficit: 3 mmol/L — ABNORMAL HIGH (ref 0.0–2.0)
Acid-base deficit: 4 mmol/L — ABNORMAL HIGH (ref 0.0–2.0)
Acid-base deficit: 4 mmol/L — ABNORMAL HIGH (ref 0.0–2.0)
Bicarbonate: 23.9 mmol/L (ref 20.0–28.0)
Bicarbonate: 24 mmol/L (ref 20.0–28.0)
Bicarbonate: 25 mmol/L (ref 20.0–28.0)
Bicarbonate: 23.1 mmol/L (ref 20.0–28.0)
Calcium, Ion: 1.08 mmol/L — ABNORMAL LOW (ref 1.15–1.40)
Calcium, Ion: 1.1 mmol/L — ABNORMAL LOW (ref 1.15–1.40)
Calcium, Ion: 1.09 mmol/L — ABNORMAL LOW (ref 1.15–1.40)
Calcium, Ion: 1.06 mmol/L — ABNORMAL LOW (ref 1.15–1.40)
HCT: 34 % — ABNORMAL LOW (ref 36.0–46.0)
HCT: 34 % — ABNORMAL LOW (ref 36.0–46.0)
HCT: 34 % — ABNORMAL LOW (ref 36.0–46.0)
HCT: 34 % — ABNORMAL LOW (ref 36.0–46.0)
Hemoglobin: 11.6 g/dL — ABNORMAL LOW (ref 12.0–15.0)
Hemoglobin: 11.6 g/dL — ABNORMAL LOW (ref 12.0–15.0)
Hemoglobin: 11.6 g/dL — ABNORMAL LOW (ref 12.0–15.0)
Hemoglobin: 11.6 g/dL — ABNORMAL LOW (ref 12.0–15.0)
O2 Saturation: 84 %
O2 Saturation: 89 %
O2 Saturation: 84 %
O2 Saturation: 99 %
Patient temperature: 97.6
Patient temperature: 98.7
Patient temperature: 98.6
Potassium: 4.2 mmol/L (ref 3.5–5.1)
Potassium: 4.1 mmol/L (ref 3.5–5.1)
Potassium: 4.3 mmol/L (ref 3.5–5.1)
Potassium: 4.3 mmol/L (ref 3.5–5.1)
Sodium: 139 mmol/L (ref 135–145)
Sodium: 138 mmol/L (ref 135–145)
Sodium: 139 mmol/L (ref 135–145)
Sodium: 136 mmol/L (ref 135–145)
TCO2: 26 mmol/L (ref 22–32)
TCO2: 26 mmol/L (ref 22–32)
TCO2: 27 mmol/L (ref 22–32)
TCO2: 25 mmol/L (ref 22–32)
pCO2 arterial: 51.3 mmHg — ABNORMAL HIGH (ref 32.0–48.0)
pCO2 arterial: 53.3 mmHg — ABNORMAL HIGH (ref 32.0–48.0)
pCO2 arterial: 62.8 mmHg — ABNORMAL HIGH (ref 32.0–48.0)
pCO2 arterial: 48 mmHg (ref 32.0–48.0)
pH, Arterial: 7.274 — ABNORMAL LOW (ref 7.350–7.450)
pH, Arterial: 7.262 — ABNORMAL LOW (ref 7.350–7.450)
pH, Arterial: 7.208 — ABNORMAL LOW (ref 7.350–7.450)
pH, Arterial: 7.29 — ABNORMAL LOW (ref 7.350–7.450)
pO2, Arterial: 55 mmHg — ABNORMAL LOW (ref 83.0–108.0)
pO2, Arterial: 66 mmHg — ABNORMAL LOW (ref 83.0–108.0)
pO2, Arterial: 60 mmHg — ABNORMAL LOW (ref 83.0–108.0)
pO2, Arterial: 144 mmHg — ABNORMAL HIGH (ref 83.0–108.0)

## 2018-12-05 LAB — HEPATIC FUNCTION PANEL
ALT: 21 U/L (ref 0–44)
AST: 49 U/L — ABNORMAL HIGH (ref 15–41)
Albumin: 2.1 g/dL — ABNORMAL LOW (ref 3.5–5.0)
Alkaline Phosphatase: 196 U/L — ABNORMAL HIGH (ref 38–126)
Bilirubin, Direct: 0.1 mg/dL (ref 0.0–0.2)
Indirect Bilirubin: 0.5 mg/dL (ref 0.3–0.9)
Total Bilirubin: 0.6 mg/dL (ref 0.3–1.2)
Total Protein: 5.9 g/dL — ABNORMAL LOW (ref 6.5–8.1)

## 2018-12-05 LAB — C4 COMPLEMENT: Complement C4, Body Fluid: 44 mg/dL (ref 14–44)

## 2018-12-05 LAB — LACTATE DEHYDROGENASE: LDH: 640 U/L — ABNORMAL HIGH (ref 98–192)

## 2018-12-05 LAB — D-DIMER, QUANTITATIVE: D-Dimer, Quant: 2.14 ug/mL-FEU — ABNORMAL HIGH (ref 0.00–0.50)

## 2018-12-05 LAB — ANTINUCLEAR ANTIBODIES, IFA: ANA Ab, IFA: NEGATIVE

## 2018-12-05 LAB — TROPONIN I: Troponin I: 0.21 ng/mL (ref <0.03)

## 2018-12-05 LAB — APTT: aPTT: 51 seconds — ABNORMAL HIGH (ref 24–36)

## 2018-12-05 LAB — C-REACTIVE PROTEIN: CRP: 22.4 mg/dL — ABNORMAL HIGH (ref <1.0)

## 2018-12-05 LAB — CORTISOL: Cortisol, Plasma: 11.4 ug/dL

## 2018-12-05 LAB — FERRITIN: Ferritin: 1641 ng/mL — ABNORMAL HIGH (ref 11–307)

## 2018-12-05 LAB — C3 COMPLEMENT: C3 Complement: 129 mg/dL (ref 82–167)

## 2018-12-05 MED ORDER — HEPARIN SODIUM (PORCINE) 5000 UNIT/ML IJ SOLN
10000.0000 [IU] | Freq: Three times a day (TID) | INTRAMUSCULAR | Status: DC
  Administered 2018-12-05 – 2018-12-09 (×14): 10000 [IU] via SUBCUTANEOUS
  Filled 2018-12-05 (×15): qty 2

## 2018-12-05 MED ORDER — HEPARIN SODIUM (PORCINE) 10000 UNIT/ML IJ SOLN
10000.0000 [IU] | Freq: Three times a day (TID) | INTRAMUSCULAR | Status: DC
  Filled 2018-12-05: qty 1

## 2018-12-05 MED ORDER — METHYLPREDNISOLONE SODIUM SUCC 125 MG IJ SOLR
40.0000 mg | INTRAMUSCULAR | Status: DC
  Administered 2018-12-06 – 2018-12-08 (×3): 40 mg via INTRAVENOUS
  Filled 2018-12-05 (×3): qty 2

## 2018-12-05 NOTE — Progress Notes (Signed)
Assisted turning patient's head and rotating arms up and down while prone.

## 2018-12-05 NOTE — Progress Notes (Signed)
Patient proned at this time.

## 2018-12-05 NOTE — Progress Notes (Signed)
Pt's daughter, [name], called to give her an update on her mom's condition and plan of care. Discussed in detail with her about her mom's need for continued chemical paralytics and proning with high Fi02 ventilation support. Informed her that Dr Kendrick Fries planned to call her with an update and to discuss her plan of care later this afternoon. Will plan to FaceTime later this afternoon with [name], once Ms. Macintosh is back in the supine position, if desired.

## 2018-12-05 NOTE — Progress Notes (Signed)
Assisted turning patient's head and rotating arms up and down as she is proned position.  Tolerated well.

## 2018-12-05 NOTE — Progress Notes (Signed)
[facility] Progress Note   Subjective:  Seen in ICU, no clotting issues, gettign 500 u/hr IV hep thru CRRT circuit. PTT 51 today. Pt proned last night, not doing much better per RN.  Keeping even on CRRT, on low dose levo gtt.   Objective Vitals:   12/05/18 0430 12/05/18 0445 12/05/18 0459 12/05/18 0500  BP:    116/61  Pulse: (!) 47 61  (!) 50  Resp: 20 (!) 0  (!) 31  Temp:      TempSrc:      SpO2: (!) 88% (!) 85%  (!) 88%  Weight:   116.1 kg   Height:       Physical Exam Proned, on vent, L IV vascath Chest cta bilat Cor reg  ABd obese, not examined Ext no sig pitting edema , R BKA Not responsive, sedated, chemically paralyzed and sedated   Assessment/Plan: 1. Hypoxic respiratory failure secondary to COVID 19 +/- bacterial PNA:  Clinically worsened and is not intubated/ proned in ICU. HCAP antibiotics per primary, dose vanc for renal function.  CXR with worsening infiltrates  Running even with CRRT- cont for now 2. Sepsis secondary to #1:  Care per above.   3. AKI on CKD: unknown baseline but per staff at [facility] 10/2018 Cr 3.5, now with severe AKI/ ATN type picture in setting of #1 and 2 above.  AKI quite common with COVID 19 and proteinuria is a frequent feature, though she has underlying DM and could have assoc proteinuria at baseline.  CT without hydro. Making urine. Started CRRT on 5/17 via temp cath, no issues w/ clotting/ machine.  4. Metabolic acidosis secondary to AKI: resolved 5. Anemia:  Hb on presentation 8.1 > 7.7 > 5.4 > transfusion 2u > 9.6 > 8.5.  PPI initiated after dark stool noted.  Now much better.  Iron sat 12% but hold on IV iron in setting of infection. Normal B12, folate 6. PAD s/p R BKA    [facility] 12/05/2018, 8:03 AM   Additional Objective Labs: Basic Metabolic Panel: Recent Labs  Lab 12/04/18 0245  12/04/18 1545  12/05/18 0140 12/05/18 0142 12/05/18 0413  NA 140   < > 139   < > 140 139 138  K 4.1   < >  4.2   < > 4.2 4.2 4.1  CL 109  --  107  --  106  --   --   CO2 20*  --  22  --  24  --   --   GLUCOSE 264*  --  269*  --  194*  --   --   BUN 60*  --  52*  --  45*  --   --   CREATININE 3.98*  --  3.24*  --  2.57*  --   --   CALCIUM 7.3*  --  7.3*  --  7.3*  --   --   PHOS 3.4  --  4.9*  --  3.9  --   --    < > = values in this interval not displayed.   Liver Function Tests: Recent Labs  Lab 11/24/2018 1645 12/02/18 0526 12/04/18 1545 12/05/18 0140  AST 35  --   --  49*  ALT 18  --   --  21  ALKPHOS 183*  --   --  196*  BILITOT 0.4  --   --  0.6  PROT 6.5  --   --  5.9*  ALBUMIN 2.7* 2.4* 2.2* 2.1*  2.2*   No results for input(s): LIPASE, AMYLASE in the last 168 hours. CBC: Recent Labs  Lab 12/01/18 0438 12/02/18 0526  12/03/18 0249  12/04/18 0245  12/05/18 0140 12/05/18 0142 12/05/18 0413  WBC 7.1 7.8  --  6.0  --  6.0  --  10.3  --   --   NEUTROABS  --  6.1  --  4.7  --   --   --  8.1*  --   --   HGB 7.7* 7.7*   < > 9.6*   < > 11.2*  11.2*   < > 11.4* 11.6* 11.6*  HCT 24.8* 25.6*   < > 29.8*   < > 33.7*  33.8*   < > 35.0* 34.0* 34.0*  MCV 92.2 93.4  --  87.4  --  86.2  --  87.1  --   --   PLT 163 159  --  144*  --  129*  --  131*  --   --    < > = values in this interval not displayed.   Blood Culture    Component Value Date/Time   SDES BLOOD LEFT HAND 12/03/2018 0245   SPECREQUEST  12/03/2018 0245    BOTTLES DRAWN AEROBIC ONLY Blood Culture adequate volume   CULT  12/03/2018 0245    NO GROWTH 1 DAY Performed at [facility], [address] [place], [place] [address]    REPTSTATUS PENDING 12/03/2018 0245    Cardiac Enzymes: Recent Labs  Lab 11/28/2018 1645 12/05/18 0140  TROPONINI 0.11* 0.21*   CBG: Recent Labs  Lab 12/04/18 1130 12/04/18 1542 12/04/18 1945 12/04/18 2316 12/05/18 0332  GLUCAP 206* 233* 198* 198* 185*   Iron Studies:  Recent Labs    12/05/18 0140  FERRITIN 1,641*   _0 @ Studies/Results: Dg Chest Port 1  View  Result Date: 12/04/2018 CLINICAL DATA:  Endotracheal tube EXAM: PORTABLE CHEST 1 VIEW COMPARISON:  Yesterday FINDINGS: Endotracheal tube tip at the clavicular heads. Bilateral central line with tip at the upper cavoatrial junction. The orogastric tube at least reaches the stomach. Bilateral airspace disease with dense consolidation in the right upper lobe. No effusion or pneumothorax. Stable heart size. IMPRESSION: Stable hardware positioning and extensive airspace disease. Electronically Signed   By: Monte Fantasia M.D.   On: 12/04/2018 07:35   Dg Chest Port 1 View  Result Date: 12/03/2018 CLINICAL DATA:  See evaluate central venous catheter placement EXAM: PORTABLE CHEST 1 VIEW COMPARISON:  12/02/2018 FINDINGS: ET tube tip is above the carina. There is a right IJ catheter with tip in the projection of the cavoatrial junction. Interval placement of left IJ catheter. Tip is also in the projection of the cavoatrial junction. No pneumothorax identified. Enteric tube tip is below the level of the GE junction. There is been significant interval worsening of aeration to the right upper lobe compared with previous exam. Patchy airspace densities within the right lower lobe and left lung appears similar. IMPRESSION: 1. Left IJ catheter tip projects over the expected location of the cavoatrial junction. No pneumothorax. 2. Significant worsening aeration to the right upper lobe. Electronically Signed   By: Kerby Moors M.D.   On: 12/03/2018 13:33   Medications: .  prismasol BGK 4/2.5 200 mL/hr at 12/04/18 1747  .  prismasol BGK 4/2.5 200 mL/hr at 12/04/18 1749  . ceFEPime (MAXIPIME) IV Stopped (12/05/18 0425)  . feeding supplement (VITAL HIGH PROTEIN) Stopped (12/04/18  2026)  . fentaNYL infusion INTRAVENOUS 275 mcg/hr (12/05/18 0500)  . heparin 10,000 units/ 20 mL infusion syringe 500 Units/hr (12/05/18 0500)  . midazolam 8 mg/hr (12/05/18 0500)  . norepinephrine (LEVOPHED) Adult infusion 3 mcg/min  (12/05/18 0500)  . prismasol BGK 4/2.5 1,500 mL/hr at 12/04/18 2322  . vancomycin Stopped (12/04/18 2159)  . vecuronium (NORCURON) infusion 0.5 mcg/kg/min (12/05/18 0500)   . artificial tears  1 application Both Eyes W4H  . chlorhexidine gluconate (MEDLINE KIT)  15 mL Mouth Rinse BID  . Chlorhexidine Gluconate Cloth  6 each Topical Q0600  . free water  200 mL Per Tube Q8H  . insulin aspart  2 Units Subcutaneous Q4H  . insulin aspart  3-9 Units Subcutaneous Q4H  . insulin glargine  10 Units Subcutaneous Daily  . mouth rinse  15 mL Mouth Rinse 10 times per day  . methylPREDNISolone (SOLU-MEDROL) injection  60 mg Intravenous Q6H  . mupirocin ointment  1 application Nasal BID  . pantoprazole (PROTONIX) IV  40 mg Intravenous Q12H

## 2018-12-05 NOTE — Progress Notes (Signed)
Turned patient's head and adjusted arms up and down as she is prone position.  Tolerated well.

## 2018-12-05 NOTE — TOC Initial Note (Signed)
Transition of Care [facility]) - Initial/Assessment Note    Patient Details  Name: Julia Thomas MRN: [id number] Date of Birth: 23-Jun-1941  [facility] CM/SW Contact:    Gildardo Griffes, [place] Phone Number: [phone] 12/05/2018, 10:00 AM  Clinical Narrative:                  CSW consulted with patient's daughter [name] to introduce role and offer support during this time. [name] thanks CSW for call and reports patient has been at [facility] since September 2017 and she would like her to return to facility when she becomes medically stable. [name] reports no questions or concerns at this time. CSW will continue to follow for discharge planning.   Expected Discharge Plan: Skilled Nursing Facility Barriers to Discharge: Continued Medical Work up   Patient Goals and CMS Choice   CMS Medicare.gov Compare Post Acute Care list provided to:: Patient Represenative (must comment)(Frances (daughter)) Choice offered to / list presented to : Adult Children(Frances)  Expected Discharge Plan and Services Expected Discharge Plan: Skilled Nursing Facility     Post Acute Care Choice: Skilled Nursing Facility Living arrangements for the past 2 months: Skilled Nursing Facility                                      Prior Living Arrangements/Services Living arrangements for the past 2 months: Skilled Nursing Facility Lives with:: Self Patient language and need for interpreter reviewed:: Yes Do you feel safe going back to the place where you live?: Yes      Need for Family Participation in Patient Care: Yes (Comment) Care giver support system in place?: Yes (comment)   Criminal Activity/Legal Involvement Pertinent to Current Situation/Hospitalization: No - Comment as needed  Activities of Daily Living Home Assistive Devices/Equipment: Other (Comment)(unknown) ADL Screening (condition at time of admission) Patient's cognitive ability adequate to safely complete daily  activities?: Yes Is the patient deaf or have difficulty hearing?: No Does the patient have difficulty seeing, even when wearing glasses/contacts?: No Does the patient have difficulty concentrating, remembering, or making decisions?: Yes Patient able to express need for assistance with ADLs?: No Does the patient have difficulty dressing or bathing?: Yes Independently performs ADLs?: No Communication: Independent Dressing (OT): Needs assistance Is this a change from baseline?: Pre-admission baseline Grooming: Needs assistance Is this a change from baseline?: Pre-admission baseline Feeding: Needs assistance Is this a change from baseline?: Pre-admission baseline Bathing: Needs assistance Is this a change from baseline?: Pre-admission baseline Toileting: Needs assistance Is this a change from baseline?: Pre-admission baseline In/Out Bed: Dependent Is this a change from baseline?: Pre-admission baseline Walks in Home: Dependent Is this a change from baseline?: Pre-admission baseline Does the patient have difficulty walking or climbing stairs?: Yes Weakness of Legs: Both Weakness of Arms/Hands: Both  Permission Sought/Granted Permission sought to share information with : Case Manager, Magazine features editor, Family Supports Permission granted to share information with : Yes, Verbal Permission Granted  Share Information with NAME: [name]  Permission granted to share info w AGENCY: SNFs  Permission granted to share info w Relationship: daughter  Permission granted to share info w Contact Information: [phone]  Emotional Assessment Appearance:: Other (Comment Required(unable to assess, remote) Attitude/Demeanor/Rapport: Unable to Assess Affect (typically observed): Unable to Assess Orientation: : (unable to assess - intubated) Alcohol / Substance Use: Not Applicable Psych Involvement: No (comment)  Admission diagnosis:  Hyperkalemia [  E87.5] AKI (acute kidney injury)  (HCC) [N17.9] Patient Active Problem List   Diagnosis Date Noted  . Encounter for central line placement   . AKI (acute kidney injury) (HCC) 12/07/2018  . Below knee amputation status, right 06/30/2016   PCP:  [name], Daniel, MD Pharmacy:   [facility] [place] - [phone] [place] [address] Phone: [phone] Fax: [phone]     Social Determinants of Health (SDOH) Interventions    Readmission Risk Interventions No flowsheet data found.

## 2018-12-05 NOTE — Progress Notes (Signed)
Assisted turning patient's head in prone position and rotating arms up and down.  Tolerated well.

## 2018-12-05 NOTE — Progress Notes (Signed)
NAME:  Julia Thomas, MRN:  [id number], DOB:  1940/09/05, LOS: 5 ADMISSION DATE:  12/14/18, CONSULTATION DATE:  5/16  REFERRING MD:  Ronaldo Miyamoto, CHIEF COMPLAINT:  Dyspnea   Brief History   78 y/o female with multiple medical problems admitted from a SNF with COVID 19 and dyspnea.    Past Medical History  Dementia CHF HTN   Significant Hospital Events   Acute blood loss anemia, 2u PRBC 5/16 VDRF 5/16  Moved to [facility] 5/17 CVVHD 5/17 Prone position  Consults:  Renal 12/01/2018 PCCM 12/02/2018  Procedures:  ETT 5/16 >>  L IJ HD cath 5/17 >>   Significant Diagnostic Tests:  Chest x-ray-multifocal infiltrate  Micro Data:  Blood cultures 5/14  Antimicrobials:  Cefepime 5/14>> Vancomycin 5/14>>  Actemra 5/17 Steroids 5/17 >>  Paralytics 5/18 >>   Interim history/subjective:  Remains profoundly hypoxemic Positioned in prone position overnight On CVVHD  Objective   Blood pressure (!) 106/44, pulse (!) 56, temperature (!) 95 F (35 C), temperature source Axillary, resp. rate (!) 25, height 5\' 4"  (1.626 m), weight 116.1 kg, SpO2 100 %.    Vent Mode: PRVC FiO2 (%):  [90 %-100 %] 90 % Set Rate:  [24 bmp-35 bmp] 35 bmp Vt Set:  [320 mL] 320 mL PEEP:  [16 cmH20] 16 cmH20 Plateau Pressure:  [22 cmH20-26 cmH20] 25 cmH20   Intake/Output Summary (Last 24 hours) at 12/05/2018 0857 Last data filed at 12/05/2018 0800 Gross per 24 hour  Intake 3121.84 ml  Output 3394 ml  Net -272.16 ml   Filed Weights   12/03/18 0354 12/04/18 0530 12/05/18 0459  Weight: 111 kg 115 kg 116.1 kg    Examination:  General:  In bed on vent, prone position HENT: NCAT ETT in place PULM: CTA B, vent supported breathing CV: RRR, no mgr GI: BS+, soft, nontender MSK: normal bulk and tone Neuro: sedated, paralyzed on vent    Resolved Hospital Problem list     Assessment & Plan:  COVID 19 causing ARDS: severe, prognosis poor Full mechanical vent support with ARDS protocol, goal PaO2:FiO2  ratio < 150 VAP prevention Daily WUA/SBT Remove volume as able with CVVHD  Shock Continue vasopressor support titrated to MAP > 65  AKI Continue CVVHD for now  Dementia Need for sedation for ventilator synchrony Continue PAD protocol, paralytic in use so RA SS score will be -5 fentanyl and Versed infusions  GI Bleed? Monitor hemoglobin and for melena  Nutrition Continue tube feeding  Best practice:  Diet: Tube feeding Pain/Anxiety/Delirium protocol (if indicated): RA SS score -5 VAP protocol (if indicated): Yes DVT prophylaxis: Sub cutaneous heparin GI prophylaxis: Pantoprazole Glucose control: Sliding scale insulin, monitor glucose Mobility: Bedrest Code Status: Discussed with her daughter at length today, expressed my opinion that she has an overall poor prognosis.  Both daughters do not want the patient to receive CPR so they requested that we change CODE STATUS to limited code but continue full support for now.  They understand that she may not survive and we elected to consider duration of full life support on a daily basis as more information becomes available. Family Communication: See above, I had a lengthy conversation with her daughter today. Disposition: Remain in ICU  Labs   CBC: Recent Labs  Lab 2018-12-14 1645  12/01/18 0438 12/02/18 0526  12/03/18 0249  12/04/18 0245  12/04/18 1746 12/04/18 2018 12/05/18 0140 12/05/18 0142 12/05/18 0413  WBC 8.7   < > 7.1 7.8  --  6.0  --  6.0  --   --   --  10.3  --   --   NEUTROABS 6.8  --   --  6.1  --  4.7  --   --   --   --   --  8.1*  --   --   HGB 8.1*   < > 7.7* 7.7*   < > 9.6*   < > 11.2*  11.2*   < > 10.9* 11.2* 11.4* 11.6* 11.6*  HCT 26.7*   < > 24.8* 25.6*   < > 29.8*   < > 33.7*  33.8*   < > 32.0* 33.0* 35.0* 34.0* 34.0*  MCV 94.7   < > 92.2 93.4  --  87.4  --  86.2  --   --   --  87.1  --   --   PLT 185   < > 163 159  --  144*  --  129*  --   --   --  131*  --   --    < > = values in this interval not  displayed.    Basic Metabolic Panel: Recent Labs  Lab 2019/02/17 2109  12/02/18 0526  12/03/18 0249  12/04/18 0245  12/04/18 1545 12/04/18 1746 12/04/18 2018 12/05/18 0140 12/05/18 0142 12/05/18 0413  NA 145   < > 150*   < > 145   < > 140   < > 139 140 139 140 139 138  K 5.4*   < > 5.5*   < > 4.9   < > 4.1   < > 4.2 4.0 4.1 4.2 4.2 4.1  CL 119*   < > 124*  --  118*  --  109  --  107  --   --  106  --   --   CO2 16*   < > 14*  --  15*  --  20*  --  22  --   --  24  --   --   GLUCOSE 111*   < > 127*  --  250*  --  264*  --  269*  --   --  194*  --   --   BUN 73*   < > 81*  --  82*  --  60*  --  52*  --   --  45*  --   --   CREATININE 5.90*   < > 6.55*  --  6.62*  --  3.98*  --  3.24*  --   --  2.57*  --   --   CALCIUM 7.5*   < > 8.1*  --  7.4*  --  7.3*  --  7.3*  --   --  7.3*  --   --   MG 2.1  --  2.2  --  2.2  --  2.3  --   --   --   --   --   --   --   PHOS 4.7*  --  5.5*  --  5.3*  --  3.4  --  4.9*  --   --  3.9  --   --    < > = values in this interval not displayed.   GFR: Estimated Creatinine Clearance: 23.3 mL/min (A) (by C-G formula based on SCr of 2.57 mg/dL (H)). Recent Labs  Lab 2019/02/17 1627  2019/02/17 1645 2019/02/17 1735 2019/02/17 2109  12/01/18 0615 12/02/18 0526 12/03/18 0249 12/04/18 0245 12/05/18  0140  PROCALCITON  --   --  6.78  --  7.19  --   --   --   --   --  13.40  WBC  --    < > 8.7  --  7.3   < >  --  7.8 6.0 6.0 10.3  LATICACIDVEN 1.3  --   --  1.1  --   --  0.9  --  0.9  --   --    < > = values in this interval not displayed.    Liver Function Tests: Recent Labs  Lab 12/09/2018 1645 12/02/18 0526 12/04/18 1545 12/05/18 0140  AST 35  --   --  49*  ALT 18  --   --  21  ALKPHOS 183*  --   --  196*  BILITOT 0.4  --   --  0.6  PROT 6.5  --   --  5.9*  ALBUMIN 2.7* 2.4* 2.2* 2.1*  2.2*   No results for input(s): LIPASE, AMYLASE in the last 168 hours. No results for input(s): AMMONIA in the last 168 hours.  ABG    Component Value  Date/Time   PHART 7.262 (L) 12/05/2018 0413   PCO2ART 53.3 (H) 12/05/2018 0413   PO2ART 66.0 (L) 12/05/2018 0413   HCO3 24.0 12/05/2018 0413   TCO2 26 12/05/2018 0413   ACIDBASEDEF 3.0 (H) 12/05/2018 0413   O2SAT 89.0 12/05/2018 0413     Coagulation Profile: Recent Labs  Lab 12/03/18 0945  INR 1.1    Cardiac Enzymes: Recent Labs  Lab 12/03/2018 1645 12/05/18 0140  TROPONINI 0.11* 0.21*    HbA1C: No results found for: HGBA1C  CBG: Recent Labs  Lab 12/04/18 1542 12/04/18 1945 12/04/18 2316 12/05/18 0332 12/05/18 0742  GLUCAP 233* 198* 198* 185* 152*     Critical care time: 45 minutes       Heber Towner, MD [facility] Pager: [phone] Cell: [phone] If no response, call [phone]

## 2018-12-05 NOTE — Progress Notes (Signed)
PROGRESS NOTE  Julia Thomas ZOX:[id number] DOB: 02/23/1941 DOA: 11/24/2018  PCP: Leanna Battles, MD  Brief History/Interval Summary: 78 year old African-American female, skilled nursing facility resident, with past medical history significant for recently positive test for COVID 19, chronic kidney disease with unknown baseline, diabetes mellitus, hypertension, CHF, dementia amongst other past medical and cardiac history.  At the skilled nursing facility patient was found to have poor oral intake.  Blood work revealed acute renal failure.  Patient was transferred to the hospital.  Patient became progressively hypoxic.  Transferred to the intensive care unit.  Subsequently intubated.  Also developed acute renal failure requiring CRRT.  Reason for Visit: Acute respiratory failure with hypoxia secondary to COVID-19  Consultants: Pulmonology.  Nephrology.  Procedures:  Intubated Dialysis catheter placement 5/17  Antibiotics: Anti-infectives (From admission, onward)   Start     Dose/Rate Route Frequency Ordered Stop   12/03/18 2200  vancomycin (VANCOCIN) IVPB 1000 mg/200 mL premix     1,000 mg 200 mL/hr over 60 Minutes Intravenous Every 24 hours 12/03/18 1115     12/03/18 1600  ceFEPIme (MAXIPIME) 2 g in sodium chloride 0.9 % 100 mL IVPB     2 g 200 mL/hr over 30 Minutes Intravenous Every 12 hours 12/03/18 1115     12/02/18 2000  vancomycin (VANCOCIN) IVPB 1000 mg/200 mL premix     1,000 mg 200 mL/hr over 60 Minutes Intravenous  Once 12/02/18 1043 12/02/18 2130   12/01/18 2200  ceFEPIme (MAXIPIME) 1 g in sodium chloride 0.9 % 100 mL IVPB  Status:  Discontinued     1 g 200 mL/hr over 30 Minutes Intravenous Every 24 hours 12/01/18 1227 12/03/18 1115   12/01/18 1800  ceFEPIme (MAXIPIME) 1 g in sodium chloride 0.9 % 100 mL IVPB  Status:  Discontinued     1 g 200 mL/hr over 30 Minutes Intravenous Every 24 hours 11/18/2018 1831 12/04/2018 2040   12/01/18 0754  vancomycin variable dose per  unstable renal function (pharmacist dosing)  Status:  Discontinued      Does not apply See admin instructions 12/01/18 0755 12/03/18 1115   11/19/2018 1830  vancomycin variable dose per unstable renal function (pharmacist dosing)  Status:  Discontinued      Does not apply See admin instructions 12/02/2018 1831 11/21/2018 2040   12/14/2018 1700  vancomycin (VANCOCIN) 1,750 mg in sodium chloride 0.9 % 500 mL IVPB     1,750 mg 250 mL/hr over 120 Minutes Intravenous  Once 11/17/2018 1646 12/09/2018 2102   11/23/2018 1700  ceFEPIme (MAXIPIME) 2 g in sodium chloride 0.9 % 100 mL IVPB     2 g 200 mL/hr over 30 Minutes Intravenous  Once 11/17/2018 1646 12/03/2018 1754       Subjective/Interval History: Patient remains intubated sedated.  Not very responsive this morning.    Assessment/Plan:  Acute Hypoxic Resp. Failure due to Acute Covid 19 Viral Illness during the ongoing 2020 Covid 19 Pandemic  Vent Mode: PRVC FiO2 (%):  [90 %-100 %] 90 % Set Rate:  [35 bmp] 35 bmp Vt Set:  [320 mL] 320 mL PEEP:  [16 cmH20] 16 cmH20 Plateau Pressure:  [22 cmH20-28 cmH20] 28 cmH20     Component Value Date/Time   PHART 7.208 (L) 12/05/2018 1023   PCO2ART 62.8 (H) 12/05/2018 1023   PO2ART 60.0 (L) 12/05/2018 1023   HCO3 25.0 12/05/2018 1023   TCO2 27 12/05/2018 1023   ACIDBASEDEF 4.0 (H) 12/05/2018 1023   O2SAT 84.0 12/05/2018 1023  COVID-19 Labs  Recent Labs    12/03/18 0249 12/04/18 0245 12/04/18 1545 12/05/18 0140  DDIMER 1.61* 2.15*  --  2.14*  FERRITIN 898* 1,267*  --  1,641*  LDH  --   --   --  640*  CRP  --   --  25.5* 22.4*     Fever: Febrile last on 5/15-16.  Has had hypothermia in the last 24 to 36 hours.  Oxygen requirements: Mechanically ventilated.  9200% FiO2 saturating in the 90s.   Antibiotics: On vancomycin and cefepime Steroids: Remains on Solu-Medrol Diuretics: Not on scheduled diuretics.  Volume being managed by CRRT Actemra: Has received 2 doses, 5/17 and 5/18 Convalescent  Plasma: Not yet   Patient remains mechanically ventilated.  Pulmonology is managing.  Chest x-ray continues to show dense consolidation in the right upper lobe.  Patient remains on broad-spectrum antibiotics as procalcitonin was significantly elevated at 7.19.  Noted to be 13.40 this morning.  CRP remains elevated at 22.4 but better from 25.5.  D-dimer stable at 2.14.  Patient remains on steroids and has received Actemra.  The treatment plan and use of medications and known side effects were discussed with patient/family. It was clearly explained that there is no proven definitive treatment for COVID-19 infection yet. Any medications used here are based on case reports/anecdotal data which are not peer-reviewed and has not been studied using randomized control trials.  Complete risks and long-term side effects are unknown, however in the best clinical judgment they seem to be of some clinical benefit rather than medical risks.  Patient/family agree with the treatment plan and want to receive these treatments as indicated.  Patient was subsequently given Actemra.  Hypothermia Most likely due to acute illness/sepsis.  TSH noted to be low at 0.156.  Could be sick euthyroid.  We will check free T4 and free T3 tomorrow.  Cortisol 11.4 although patient has been on Solu-Medrol blood cultures negative so far.    Acute kidney injury on chronic kidney disease with normal anion gap metabolic acidosis Nephrology is following.  Patient on CRRT.  Very minimal urine output.  Diabetes mellitus type II with hyperglycemia Hyperglycemia secondary to steroids.  Continue with Lantus insulin and SSI.  Septic shock Blood pressure remains low.  She continues to require vasopressors.  Questionable acute blood loss anemia During the earlier part of this hospitalization her hemoglobin dropped to 5.4. Patient was noted to have dark stools.  Unclear if patient truly had GI bleed.  Patient was transfused.  Hemoglobin has been  stable subsequently.  Continue to monitor.  Concern for GI bleeding Apparently had dark stool.  Had a drop in hemoglobin requiring blood transfusion.  Greenish colored stool noted in the rectal tube.  Hemoglobin has been stable since transfusion.  Since hemoglobin has been stable okay to initiate DVT prophylaxis. Continue PPI every 12 hours for now.  History of dementia Supportive measures.  Nutrition Continue tube feedings.   DVT Prophylaxis: Subcutaneous heparin 10,000 units every 8 hours  Lab Results  Component Value Date   PLT 131 (L) 12/05/2018     PUD Prophylaxis: PPI every 12 hours Code Status: Partial code Family Communication: PCCM to discuss with family Disposition Plan: Unclear.  Continue ICU care.   Medications:  Scheduled: . artificial tears  1 application Both Eyes Y5K  . chlorhexidine gluconate (MEDLINE KIT)  15 mL Mouth Rinse BID  . Chlorhexidine Gluconate Cloth  6 each Topical Q0600  . free water  200 mL  Per Tube Q8H  . heparin  10,000 Units Subcutaneous Q8H  . insulin aspart  2 Units Subcutaneous Q4H  . insulin aspart  3-9 Units Subcutaneous Q4H  . insulin glargine  10 Units Subcutaneous Daily  . mouth rinse  15 mL Mouth Rinse 10 times per day  . [START ON 12/06/2018] methylPREDNISolone (SOLU-MEDROL) injection  40 mg Intravenous Q24H  . mupirocin ointment  1 application Nasal BID  . pantoprazole (PROTONIX) IV  40 mg Intravenous Q12H   Continuous: .  prismasol BGK 4/2.5 200 mL/hr at 12/04/18 1747  .  prismasol BGK 4/2.5 200 mL/hr at 12/04/18 1749  . ceFEPime (MAXIPIME) IV 2 g (12/05/18 1500)  . feeding supplement (VITAL HIGH PROTEIN) 1,000 mL (12/05/18 0900)  . fentaNYL infusion INTRAVENOUS 300 mcg/hr (12/05/18 1400)  . heparin 10,000 units/ 20 mL infusion syringe 500 Units/hr (12/05/18 1500)  . midazolam 8 mg/hr (12/05/18 1438)  . norepinephrine (LEVOPHED) Adult infusion 8 mcg/min (12/05/18 1400)  . prismasol BGK 4/2.5 1,500 mL/hr at 12/05/18 1030   . vancomycin Stopped (12/04/18 2159)  . vecuronium (NORCURON) infusion 1 mcg/kg/min (12/05/18 1400)   DVV:[id number], fentaNYL, heparin, heparin, midazolam   Objective:  Vital Signs  Vitals:   12/05/18 1200 12/05/18 1400 12/05/18 1500 12/05/18 1520  BP: (!) 84/46 (!) 115/56 (!) 77/38 (!) 92/42  Pulse: 63 71 74 76  Resp: (!) 35 (!) 35 (!) 35 (!) 35  Temp: (!) 95.9 F (35.5 C)     TempSrc: Axillary     SpO2: 99% 100% 96% 97%  Weight:      Height:        Intake/Output Summary (Last 24 hours) at 12/05/2018 1555 Last data filed at 12/05/2018 1500 Gross per 24 hour  Intake 2523.89 ml  Output 3049 ml  Net -525.11 ml   Filed Weights   12/03/18 0354 12/04/18 0530 12/05/18 0459  Weight: 111 kg 115 kg 116.1 kg   General appearance: Patient remains intubated and sedated Resp: Coarse breath sounds bilaterally.  Crackles at the bases.  Few rhonchi on the right side.  No wheezing. Cardio: S1-S2 is normal regular.  No S3-S4.  No rubs murmurs or bruit GI: Abdomen is soft.  Nontender nondistended.  Bowel sounds are present normal.  No masses organomegaly Extremities: Minimal edema bilateral lower extremities Neurologic: Sedated    Lab Results:  Data Reviewed: I have personally reviewed following labs and imaging studies  CBC: Recent Labs  Lab 12/12/2018 1645  12/01/18 0438 12/02/18 0526  12/03/18 0249  12/04/18 0245  12/04/18 2018 12/05/18 0140 12/05/18 0142 12/05/18 0413 12/05/18 1023  WBC 8.7   < > 7.1 7.8  --  6.0  --  6.0  --   --  10.3  --   --   --   NEUTROABS 6.8  --   --  6.1  --  4.7  --   --   --   --  8.1*  --   --   --   HGB 8.1*   < > 7.7* 7.7*   < > 9.6*   < > 11.2*  11.2*   < > 11.2* 11.4* 11.6* 11.6* 11.6*  HCT 26.7*   < > 24.8* 25.6*   < > 29.8*   < > 33.7*  33.8*   < > 33.0* 35.0* 34.0* 34.0* 34.0*  MCV 94.7   < > 92.2 93.4  --  87.4  --  86.2  --   --  87.1  --   --   --  PLT 185   < > 163 159  --  144*  --  129*  --   --  131*  --   --   --    <  > = values in this interval not displayed.    Basic Metabolic Panel: Recent Labs  Lab 11/19/2018 2109  12/02/18 0526  12/03/18 0249  12/04/18 0245  12/04/18 1545  12/04/18 2018 12/05/18 0140 12/05/18 0142 12/05/18 0413 12/05/18 1023  NA 145   < > 150*   < > 145   < > 140   < > 139   < > 139 140 139 138 139  K 5.4*   < > 5.5*   < > 4.9   < > 4.1   < > 4.2   < > 4.1 4.2 4.2 4.1 4.3  CL 119*   < > 124*  --  118*  --  109  --  107  --   --  106  --   --   --   CO2 16*   < > 14*  --  15*  --  20*  --  22  --   --  24  --   --   --   GLUCOSE 111*   < > 127*  --  250*  --  264*  --  269*  --   --  194*  --   --   --   BUN 73*   < > 81*  --  82*  --  60*  --  52*  --   --  45*  --   --   --   CREATININE 5.90*   < > 6.55*  --  6.62*  --  3.98*  --  3.24*  --   --  2.57*  --   --   --   CALCIUM 7.5*   < > 8.1*  --  7.4*  --  7.3*  --  7.3*  --   --  7.3*  --   --   --   MG 2.1  --  2.2  --  2.2  --  2.3  --   --   --   --   --   --   --   --   PHOS 4.7*  --  5.5*  --  5.3*  --  3.4  --  4.9*  --   --  3.9  --   --   --    < > = values in this interval not displayed.    GFR: Estimated Creatinine Clearance: 23.3 mL/min (A) (by C-G formula based on SCr of 2.57 mg/dL (H)).  Liver Function Tests: Recent Labs  Lab 11/23/2018 1645 12/02/18 0526 12/04/18 1545 12/05/18 0140  AST 35  --   --  49*  ALT 18  --   --  21  ALKPHOS 183*  --   --  196*  BILITOT 0.4  --   --  0.6  PROT 6.5  --   --  5.9*  ALBUMIN 2.7* 2.4* 2.2* 2.1*  2.2*     Coagulation Profile: Recent Labs  Lab 12/03/18 0945  INR 1.1    Cardiac Enzymes: Recent Labs  Lab 12/14/2018 1645 12/05/18 0140  TROPONINI 0.11* 0.21*     CBG: Recent Labs  Lab 12/05/18 0332 12/05/18 0742 12/05/18 1152 12/05/18 1203 12/05/18 1534  GLUCAP 185* 152* 158* 149* 139*    Anemia  Panel: Recent Labs    12/04/18 0245 12/05/18 0140  FERRITIN 1,267* 1,641*    Recent Results (from the past 240 hour(s))  Blood Culture  (routine x 2)     Status: None   Collection Time: 12/01/2018  4:45 PM  Result Value Ref Range Status   Specimen Description BLOOD RIGHT HAND  Final   Special Requests   Final    BOTTLES DRAWN AEROBIC AND ANAEROBIC Blood Culture adequate volume   Culture   Final    NO GROWTH 5 DAYS Performed at [facility], [address], [place], [place] [address]    Report Status 12/05/2018 FINAL  Final  Blood Culture (routine x 2)     Status: None   Collection Time: 11/17/2018  5:10 PM  Result Value Ref Range Status   Specimen Description BLOOD LEFT HAND  Final   Special Requests   Final    BOTTLES DRAWN AEROBIC ONLY Blood Culture results may not be optimal due to an inadequate volume of blood received in culture bottles   Culture   Final    NO GROWTH 5 DAYS Performed at [facility], [place] [address], [place], [place] [address]    Report Status 12/05/2018 FINAL  Final  Urine culture     Status: Abnormal   Collection Time: 12/09/2018  5:28 PM  Result Value Ref Range Status   Specimen Description URINE, CATHETERIZED  Final   Special Requests NONE  Final   Culture (A)  Final    <10,000 COLONIES/mL INSIGNIFICANT GROWTH Performed at [facility], [place] [address], [place], [place] [address]    Report Status 12/01/2018 FINAL  Final  MRSA PCR Screening     Status: Abnormal   Collection Time: 12/02/18 11:21 PM  Result Value Ref Range Status   MRSA by PCR POSITIVE (A) NEGATIVE Final    Comment:        The GeneXpert MRSA Assay (FDA approved for NASAL specimens only), is one component of a comprehensive MRSA colonization surveillance program. It is not intended to diagnose MRSA infection nor to guide or monitor treatment for MRSA infections. RESULT CALLED TO, READ BACK BY AND VERIFIED WITH: Crista Elliot RN @ [phone] ON 12/03/18 BY ROBINSON Z.  Performed at [facility], [place] [address], [place], [place] [address]   Culture, blood (routine x 2)     Status: None (Preliminary result)    Collection Time: 12/03/18  2:30 AM  Result Value Ref Range Status   Specimen Description BLOOD LEFT ANTECUBITAL  Final   Special Requests   Final    BOTTLES DRAWN AEROBIC ONLY Blood Culture adequate volume   Culture   Final    NO GROWTH 2 DAYS Performed at [facility], [place] [address] [place],  [address]    Report Status PENDING  Incomplete  Culture, blood (routine x 2)     Status: None (Preliminary result)   Collection Time: 12/03/18  2:45 AM  Result Value Ref Range Status   Specimen Description BLOOD LEFT HAND  Final   Special Requests   Final    BOTTLES DRAWN AEROBIC ONLY Blood Culture adequate volume   Culture   Final    NO GROWTH 2 DAYS Performed at [facility], [place] [address] [place],  [address]    Report Status PENDING  Incomplete      Radiology Studies: Dg Chest Port 1 View  Result Date: 12/04/2018 CLINICAL DATA:  Endotracheal tube EXAM: PORTABLE CHEST 1 VIEW COMPARISON:  Yesterday FINDINGS: Endotracheal tube tip at the clavicular heads. Bilateral central line with tip at the upper cavoatrial junction. The orogastric tube at least reaches the stomach. Bilateral airspace disease with dense consolidation in the right upper lobe. No effusion or pneumothorax. Stable heart size. IMPRESSION: Stable hardware positioning and extensive airspace disease. Electronically Signed   By: Monte Fantasia M.D.   On: 12/04/2018 07:35       LOS: 5 days   [facility] Pager on [email or web address]  12/05/2018, 3:55 PM

## 2018-12-06 ENCOUNTER — Encounter (HOSPITAL_COMMUNITY): Payer: Self-pay | Admitting: Nephrology

## 2018-12-06 ENCOUNTER — Inpatient Hospital Stay (HOSPITAL_COMMUNITY): Payer: 59

## 2018-12-06 LAB — CBC
HCT: 34.1 % — ABNORMAL LOW (ref 36.0–46.0)
Hemoglobin: 10.8 g/dL — ABNORMAL LOW (ref 12.0–15.0)
MCH: 28.1 pg (ref 26.0–34.0)
MCHC: 31.7 g/dL (ref 30.0–36.0)
MCV: 88.8 fL (ref 80.0–100.0)
Platelets: 102 10*3/uL — ABNORMAL LOW (ref 150–400)
RBC: 3.84 MIL/uL — ABNORMAL LOW (ref 3.87–5.11)
RDW: 15.8 % — ABNORMAL HIGH (ref 11.5–15.5)
WBC: 12.9 10*3/uL — ABNORMAL HIGH (ref 4.0–10.5)
nRBC: 0.4 % — ABNORMAL HIGH (ref 0.0–0.2)

## 2018-12-06 LAB — POCT I-STAT 7, (LYTES, BLD GAS, ICA,H+H)
Acid-base deficit: 3 mmol/L — ABNORMAL HIGH (ref 0.0–2.0)
Acid-base deficit: 2 mmol/L (ref 0.0–2.0)
Bicarbonate: 23.6 mmol/L (ref 20.0–28.0)
Bicarbonate: 24.4 mmol/L (ref 20.0–28.0)
Calcium, Ion: 1.04 mmol/L — ABNORMAL LOW (ref 1.15–1.40)
Calcium, Ion: 1.04 mmol/L — ABNORMAL LOW (ref 1.15–1.40)
HCT: 38 % (ref 36.0–46.0)
HCT: 31 % — ABNORMAL LOW (ref 36.0–46.0)
Hemoglobin: 12.9 g/dL (ref 12.0–15.0)
Hemoglobin: 10.5 g/dL — ABNORMAL LOW (ref 12.0–15.0)
O2 Saturation: 98 %
O2 Saturation: 94 %
Patient temperature: 98.7
Patient temperature: 36.6
Potassium: 4.4 mmol/L (ref 3.5–5.1)
Potassium: 5.2 mmol/L — ABNORMAL HIGH (ref 3.5–5.1)
Sodium: 136 mmol/L (ref 135–145)
Sodium: 135 mmol/L (ref 135–145)
TCO2: 25 mmol/L (ref 22–32)
TCO2: 26 mmol/L (ref 22–32)
pCO2 arterial: 45.3 mmHg (ref 32.0–48.0)
pCO2 arterial: 45.2 mmHg (ref 32.0–48.0)
pH, Arterial: 7.325 — ABNORMAL LOW (ref 7.350–7.450)
pH, Arterial: 7.339 — ABNORMAL LOW (ref 7.350–7.450)
pO2, Arterial: 110 mmHg — ABNORMAL HIGH (ref 83.0–108.0)
pO2, Arterial: 76 mmHg — ABNORMAL LOW (ref 83.0–108.0)

## 2018-12-06 LAB — RENAL FUNCTION PANEL
Albumin: 2 g/dL — ABNORMAL LOW (ref 3.5–5.0)
Albumin: 2 g/dL — ABNORMAL LOW (ref 3.5–5.0)
Anion gap: 12 (ref 5–15)
Anion gap: 9 (ref 5–15)
BUN: 30 mg/dL — ABNORMAL HIGH (ref 8–23)
BUN: 27 mg/dL — ABNORMAL HIGH (ref 8–23)
CO2: 21 mmol/L — ABNORMAL LOW (ref 22–32)
CO2: 23 mmol/L (ref 22–32)
Calcium: 7.2 mg/dL — ABNORMAL LOW (ref 8.9–10.3)
Calcium: 7.3 mg/dL — ABNORMAL LOW (ref 8.9–10.3)
Chloride: 104 mmol/L (ref 98–111)
Chloride: 104 mmol/L (ref 98–111)
Creatinine, Ser: 1.93 mg/dL — ABNORMAL HIGH (ref 0.44–1.00)
Creatinine, Ser: 1.73 mg/dL — ABNORMAL HIGH (ref 0.44–1.00)
GFR calc Af Amer: 29 mL/min — ABNORMAL LOW (ref >60)
GFR calc Af Amer: 33 mL/min — ABNORMAL LOW (ref >60)
GFR calc non Af Amer: 25 mL/min — ABNORMAL LOW (ref >60)
GFR calc non Af Amer: 28 mL/min — ABNORMAL LOW (ref >60)
Glucose, Bld: 188 mg/dL — ABNORMAL HIGH (ref 70–99)
Glucose, Bld: 212 mg/dL — ABNORMAL HIGH (ref 70–99)
Phosphorus: 2.8 mg/dL (ref 2.5–4.6)
Phosphorus: 2.4 mg/dL — ABNORMAL LOW (ref 2.5–4.6)
Potassium: 4.2 mmol/L (ref 3.5–5.1)
Potassium: 4.2 mmol/L (ref 3.5–5.1)
Sodium: 137 mmol/L (ref 135–145)
Sodium: 136 mmol/L (ref 135–145)

## 2018-12-06 LAB — GLUCOSE, CAPILLARY
Glucose-Capillary: 164 mg/dL — ABNORMAL HIGH (ref 70–99)
Glucose-Capillary: 192 mg/dL — ABNORMAL HIGH (ref 70–99)
Glucose-Capillary: 194 mg/dL — ABNORMAL HIGH (ref 70–99)
Glucose-Capillary: 196 mg/dL — ABNORMAL HIGH (ref 70–99)
Glucose-Capillary: 178 mg/dL — ABNORMAL HIGH (ref 70–99)
Glucose-Capillary: 205 mg/dL — ABNORMAL HIGH (ref 70–99)

## 2018-12-06 LAB — APTT: aPTT: 58 seconds — ABNORMAL HIGH (ref 24–36)

## 2018-12-06 LAB — PROCALCITONIN: Procalcitonin: 11.32 ng/mL

## 2018-12-06 MED ORDER — MIDAZOLAM 50MG/50ML (1MG/ML) PREMIX INFUSION
0.0000 mg/h | INTRAVENOUS | Status: DC
  Administered 2018-12-06 – 2018-12-07 (×3): 6 mg/h via INTRAVENOUS
  Administered 2018-12-08: 1 mg/h via INTRAVENOUS
  Administered 2018-12-09: 0.5 mg/h via INTRAVENOUS
  Administered 2018-12-10: 2 mg/h via INTRAVENOUS
  Filled 2018-12-06 (×5): qty 50

## 2018-12-06 MED ORDER — FENTANYL CITRATE (PF) 100 MCG/2ML IJ SOLN
25.0000 ug | Freq: Once | INTRAMUSCULAR | Status: AC
  Administered 2018-12-06: 25 ug via INTRAVENOUS

## 2018-12-06 MED ORDER — FENTANYL 2500MCG IN NS 250ML (10MCG/ML) PREMIX INFUSION
25.0000 ug/h | INTRAVENOUS | Status: DC
  Administered 2018-12-06 – 2018-12-07 (×2): 200 ug/h via INTRAVENOUS
  Administered 2018-12-08 – 2018-12-10 (×2): 50 ug/h via INTRAVENOUS
  Filled 2018-12-06 (×4): qty 250

## 2018-12-06 MED ORDER — HYDRALAZINE HCL 20 MG/ML IJ SOLN: 10.0000 mg | INTRAMUSCULAR | Status: DC | PRN

## 2018-12-06 MED ORDER — FENTANYL BOLUS VIA INFUSION
25.0000 ug | INTRAVENOUS | Status: DC | PRN
  Filled 2018-12-06: qty 25

## 2018-12-06 MED ORDER — MIDAZOLAM BOLUS VIA INFUSION
1.0000 mg | INTRAVENOUS | Status: DC | PRN
  Filled 2018-12-06: qty 2

## 2018-12-06 NOTE — Progress Notes (Signed)
Spoke with Daughter this morning and have updated her on plan of care.

## 2018-12-06 NOTE — Progress Notes (Signed)
Patient placed in the prone position.  ETT secured at 24 cm prior to placing the patient prone; secured at 24 post positioning.  Patient tolerated well.

## 2018-12-06 NOTE — Progress Notes (Signed)
Patients primary contact Thelma Barge called for updates and we discussed her condition

## 2018-12-06 NOTE — Progress Notes (Signed)
PROGRESS NOTE  Julia Thomas WPY:[id number] DOB: 1940/09/16 DOA: 12/12/2018  PCP: Leanna Battles, MD  Brief History/Interval Summary: 78 year old African-American female, skilled nursing facility resident, with past medical history significant for recently positive test for COVID 19, chronic kidney disease with unknown baseline, diabetes mellitus, hypertension, CHF, dementia amongst other past medical and cardiac history.  At the skilled nursing facility patient was found to have poor oral intake.  Blood work revealed acute renal failure.  Patient was transferred to the hospital.  Patient became progressively hypoxic.  Transferred to the intensive care unit.  Subsequently intubated.  Also developed acute renal failure requiring CRRT.  Reason for Visit: Acute respiratory failure with hypoxia secondary to COVID-19  Consultants: Pulmonology.  Nephrology.  Procedures:  Intubated Dialysis catheter placement 5/17  Antibiotics: Anti-infectives (From admission, onward)   Start     Dose/Rate Route Frequency Ordered Stop   12/03/18 2200  vancomycin (VANCOCIN) IVPB 1000 mg/200 mL premix     1,000 mg 200 mL/hr over 60 Minutes Intravenous Every 24 hours 12/03/18 1115     12/03/18 1600  ceFEPIme (MAXIPIME) 2 g in sodium chloride 0.9 % 100 mL IVPB     2 g 200 mL/hr over 30 Minutes Intravenous Every 12 hours 12/03/18 1115     12/02/18 2000  vancomycin (VANCOCIN) IVPB 1000 mg/200 mL premix     1,000 mg 200 mL/hr over 60 Minutes Intravenous  Once 12/02/18 1043 12/02/18 2130   12/01/18 2200  ceFEPIme (MAXIPIME) 1 g in sodium chloride 0.9 % 100 mL IVPB  Status:  Discontinued     1 g 200 mL/hr over 30 Minutes Intravenous Every 24 hours 12/01/18 1227 12/03/18 1115   12/01/18 1800  ceFEPIme (MAXIPIME) 1 g in sodium chloride 0.9 % 100 mL IVPB  Status:  Discontinued     1 g 200 mL/hr over 30 Minutes Intravenous Every 24 hours 12/02/2018 1831 12/14/2018 2040   12/01/18 0754  vancomycin variable dose per  unstable renal function (pharmacist dosing)  Status:  Discontinued      Does not apply See admin instructions 12/01/18 0755 12/03/18 1115   11/20/2018 1830  vancomycin variable dose per unstable renal function (pharmacist dosing)  Status:  Discontinued      Does not apply See admin instructions 11/29/2018 1831 12/04/2018 2040   12/05/2018 1700  vancomycin (VANCOCIN) 1,750 mg in sodium chloride 0.9 % 500 mL IVPB     1,750 mg 250 mL/hr over 120 Minutes Intravenous  Once 12/15/2018 1646 11/24/2018 2102   12/06/2018 1700  ceFEPIme (MAXIPIME) 2 g in sodium chloride 0.9 % 100 mL IVPB     2 g 200 mL/hr over 30 Minutes Intravenous  Once 11/29/2018 1646 12/13/2018 1754       Subjective/Interval History: Patient remains intubated sedated.      Assessment/Plan:  Acute Hypoxic Resp. Failure due to Acute Covid 19 Viral Illness during the ongoing 2020 Covid 19 Pandemic  Vent Mode: PRVC FiO2 (%):  [60 %-90 %] 60 % Set Rate:  [35 bmp] 35 bmp Vt Set:  [320 mL] 320 mL PEEP:  [14 cmH20-16 cmH20] 14 cmH20 Plateau Pressure:  [22 cmH20-28 cmH20] 28 cmH20     Component Value Date/Time   PHART 7.325 (L) 12/06/2018 0151   PCO2ART 45.3 12/06/2018 0151   PO2ART 110.0 (H) 12/06/2018 0151   HCO3 23.6 12/06/2018 0151   TCO2 25 12/06/2018 0151   ACIDBASEDEF 3.0 (H) 12/06/2018 0151   O2SAT 98.0 12/06/2018 0151    COVID-19 Labs  Recent Labs    12/04/18 0245 12/04/18 1545 12/05/18 0140  DDIMER 2.15*  --  2.14*  FERRITIN 1,267*  --  1,641*  LDH  --   --  640*  CRP  --  25.5* 22.4*     Fever: Febrile last on 5/15-16.  Noted to be hypothermic in the last 24 to 48 hours Oxygen requirements: Mechanically ventilated.  FiO2 has been dropped down to 60%.  Patient is saturating in the 90s.   Antibiotics: On vancomycin and cefepime Steroids: Remains on Solu-Medrol.  Dose has been decreased Diuretics: Not on scheduled diuretics.  Volume being managed by CRRT Actemra: Has received 2 doses, 5/17 and 5/18 Convalescent  Plasma: Not yet  Patient remains mechanically ventilated.  Pulmonology is managing.  Chest x-ray actually shows improvement in the right upper lobe consolidation.  Patient's oxygenation has improved this morning.  Patient remains on broad-spectrum antibiotics with vancomycin and cefepime.  Remains elevated at 11.32 today.  We will recheck procalcitonin level again tomorrow.  Will need to determine duration of antibiotic treatment.  Could consider a 7-day course which she will complete tomorrow. MRSA PCR was positive.  Blood cultures negative.  D-dimer is stable.  CRP was 22.4 yesterday.  We will recheck some of these values tomorrow.  Patient has received 2 doses of Actemra and remains on steroids as discussed above.  The treatment plan and use of medications and known side effects were discussed with patient/family. It was clearly explained that there is no proven definitive treatment for COVID-19 infection yet. Any medications used here are based on case reports/anecdotal data which are not peer-reviewed and has not been studied using randomized control trials.  Complete risks and long-term side effects are unknown, however in the best clinical judgment they seem to be of some clinical benefit rather than medical risks.  Patient/family agree with the treatment plan and want to receive these treatments as indicated.  Patient was subsequently given Actemra.  Hypothermia Most likely due to acute illness/sepsis/CRRT.  TSH noted to be low at 0.156.  Could be sick euthyroid.  Free T3 levels to be checked.  We will check free T4 and free T3 tomorrow.  Cortisol 11.4 although patient has been on Solu-Medrol blood cultures negative so far.    Acute kidney injury on chronic kidney disease with normal anion gap metabolic acidosis Patient remains on CRRT.  Nephrology is following and managing.  Very minimal urine output.  Will discuss with nephrology today.  Diabetes mellitus type II with  hyperglycemia Hyperglycemia secondary to steroids.  Continue with Lantus insulin and SSI.  CBG should improve as steroid is tapered down.  Check HbA1c.  Septic shock Blood pressure remains low.  She continues to require vasopressors.  Questionable acute blood loss anemia During the earlier part of this hospitalization her hemoglobin dropped to 5.4. Patient was noted to have dark stools.  Unclear if patient truly had GI bleed.  Patient was transfused.  Hemoglobin has been stable subsequently.  Continue to monitor.  Concern for GI bleeding Apparently had dark stool.  Had a drop in hemoglobin requiring blood transfusion.  Hemoglobin has been stable since transfusion.  Has not had any further dark or melanotic stools.  Since hemoglobin has been stable okay to initiate DVT prophylaxis. Continue PPI every 12 hours for now.  Mild thrombocytopenia Slight drop in platelet count noted.  Monitor for now.  History of dementia Supportive measures.  Nutrition Continue tube feedings.   DVT Prophylaxis: Subcutaneous heparin 10,000  units every 8 hours  Lab Results  Component Value Date   PLT 102 (L) 12/06/2018     PUD Prophylaxis: PPI every 12 hours Code Status: Partial code Family Communication: PCCM to discuss with family Disposition Plan: Unclear.  Continue ICU care.   Medications:  Scheduled: . artificial tears  1 application Both Eyes J6R  . chlorhexidine gluconate (MEDLINE KIT)  15 mL Mouth Rinse BID  . Chlorhexidine Gluconate Cloth  6 each Topical Q0600  . free water  200 mL Per Tube Q8H  . heparin  10,000 Units Subcutaneous Q8H  . insulin aspart  2 Units Subcutaneous Q4H  . insulin aspart  3-9 Units Subcutaneous Q4H  . insulin glargine  10 Units Subcutaneous Daily  . mouth rinse  15 mL Mouth Rinse 10 times per day  . methylPREDNISolone (SOLU-MEDROL) injection  40 mg Intravenous Q24H  . mupirocin ointment  1 application Nasal BID  . pantoprazole (PROTONIX) IV  40 mg Intravenous  Q12H   Continuous: .  prismasol BGK 4/2.5 200 mL/hr at 12/05/18 1940  .  prismasol BGK 4/2.5 200 mL/hr at 12/05/18 1939  . ceFEPime (MAXIPIME) IV Stopped (12/06/18 0455)  . feeding supplement (VITAL HIGH PROTEIN) 20 mL/hr at 12/06/18 0200  . fentaNYL infusion INTRAVENOUS 250 mcg/hr (12/06/18 1000)  . heparin 10,000 units/ 20 mL infusion syringe 500 Units/hr (12/06/18 1000)  . midazolam 6 mg/hr (12/06/18 1000)  . norepinephrine (LEVOPHED) Adult infusion 5 mcg/min (12/06/18 1108)  . prismasol BGK 4/2.5 1,500 mL/hr at 12/06/18 0929  . vancomycin Stopped (12/05/18 2216)  . vecuronium (NORCURON) infusion 1 mcg/kg/min (12/06/18 1000)   CVE:LFYBOFBP, heparin, heparin, midazolam   Objective:  Vital Signs  Vitals:   12/06/18 0830 12/06/18 0838 12/06/18 0900 12/06/18 1000  BP: (!) 112/47   (!) 129/54  Pulse: 69  68 66  Resp: (!) 35  (!) 35 (!) 35  Temp:      TempSrc:      SpO2: 100% 100% 100% 100%  Weight:      Height:        Intake/Output Summary (Last 24 hours) at 12/06/2018 1123 Last data filed at 12/06/2018 1000 Gross per 24 hour  Intake 3416.21 ml  Output 3126 ml  Net 290.21 ml   Filed Weights   12/04/18 0530 12/05/18 0459 12/06/18 0447  Weight: 115 kg 116.1 kg 113.9 kg   General appearance: Intubated and sedated Resp: Coarse breath sounds bilaterally.  No wheezing rales or rhonchi Cardio: S1-S2 is normal regular.  No S3-S4.  No rubs murmurs or bruit GI: Abdomen is soft.  Nontender nondistended.  Bowel sounds are present normal.  No masses organomegaly Extremities: Minimal edema bilateral lower extremities Neurologic: Sedated.  On paralytics   Lab Results:  Data Reviewed: I have personally reviewed following labs and imaging studies  CBC: Recent Labs  Lab 12/03/2018 1645  12/02/18 0526  12/03/18 0249  12/04/18 0245  12/05/18 0140  12/05/18 0413 12/05/18 1023 12/05/18 2251 12/06/18 0151 12/06/18 0220  WBC 8.7   < > 7.8  --  6.0  --  6.0  --  10.3  --   --    --   --   --  12.9*  NEUTROABS 6.8  --  6.1  --  4.7  --   --   --  8.1*  --   --   --   --   --   --   HGB 8.1*   < > 7.7*   < >  9.6*   < > 11.2*  11.2*   < > 11.4*   < > 11.6* 11.6* 11.6* 12.9 10.8*  HCT 26.7*   < > 25.6*   < > 29.8*   < > 33.7*  33.8*   < > 35.0*   < > 34.0* 34.0* 34.0* 38.0 34.1*  MCV 94.7   < > 93.4  --  87.4  --  86.2  --  87.1  --   --   --   --   --  88.8  PLT 185   < > 159  --  144*  --  129*  --  131*  --   --   --   --   --  102*   < > = values in this interval not displayed.    Basic Metabolic Panel: Recent Labs  Lab 12/08/2018 2109  12/02/18 0526  12/03/18 0249  12/04/18 0245  12/04/18 1545  12/05/18 0140  12/05/18 1023 12/05/18 1515 12/05/18 2251 12/06/18 0151 12/06/18 0220  NA 145   < > 150*   < > 145   < > 140   < > 139   < > 140   < > 139 139 136 136 137  K 5.4*   < > 5.5*   < > 4.9   < > 4.1   < > 4.2   < > 4.2   < > 4.3 4.4 4.3 4.4 4.2  CL 119*   < > 124*  --  118*  --  109  --  107  --  106  --   --  106  --   --  104  CO2 16*   < > 14*  --  15*  --  20*  --  22  --  24  --   --  23  --   --  21*  GLUCOSE 111*   < > 127*  --  250*  --  264*  --  269*  --  194*  --   --  164*  --   --  188*  BUN 73*   < > 81*  --  82*  --  60*  --  52*  --  45*  --   --  34*  --   --  30*  CREATININE 5.90*   < > 6.55*  --  6.62*  --  3.98*  --  3.24*  --  2.57*  --   --  2.11*  --   --  1.93*  CALCIUM 7.5*   < > 8.1*  --  7.4*  --  7.3*  --  7.3*  --  7.3*  --   --  7.5*  --   --  7.2*  MG 2.1  --  2.2  --  2.2  --  2.3  --   --   --   --   --   --   --   --   --   --   PHOS 4.7*  --  5.5*  --  5.3*  --  3.4  --  4.9*  --  3.9  --   --  3.6  --   --  2.8   < > = values in this interval not displayed.    GFR: Estimated Creatinine Clearance: 30.7 mL/min (A) (by C-G formula based on SCr of 1.93 mg/dL (H)).  Liver Function Tests: Recent Labs  Lab  11/29/2018 1645 12/02/18 0526 12/04/18 1545 12/05/18 0140 12/05/18 1515 12/06/18 0220  AST 35  --   --  49*  --    --   ALT 18  --   --  21  --   --   ALKPHOS 183*  --   --  196*  --   --   BILITOT 0.4  --   --  0.6  --   --   PROT 6.5  --   --  5.9*  --   --   ALBUMIN 2.7* 2.4* 2.2* 2.1*  2.2* 2.4* 2.0*     Coagulation Profile: Recent Labs  Lab 12/03/18 0945  INR 1.1    Cardiac Enzymes: Recent Labs  Lab 11/26/2018 1645 12/05/18 0140  TROPONINI 0.11* 0.21*     CBG: Recent Labs  Lab 12/05/18 1534 12/05/18 1946 12/05/18 2321 12/06/18 0406 12/06/18 0803  GLUCAP 139* 168* 191* 164* 192*    Anemia Panel: Recent Labs    12/04/18 0245 12/05/18 0140  FERRITIN 1,267* 1,641*    Recent Results (from the past 240 hour(s))  Blood Culture (routine x 2)     Status: None   Collection Time: 12/03/2018  4:45 PM  Result Value Ref Range Status   Specimen Description BLOOD RIGHT HAND  Final   Special Requests   Final    BOTTLES DRAWN AEROBIC AND ANAEROBIC Blood Culture adequate volume   Culture   Final    NO GROWTH 5 DAYS Performed at [facility], [place] [address], [place], [place] [address]    Report Status 12/05/2018 FINAL  Final  Blood Culture (routine x 2)     Status: None   Collection Time: 11/18/2018  5:10 PM  Result Value Ref Range Status   Specimen Description BLOOD LEFT HAND  Final   Special Requests   Final    BOTTLES DRAWN AEROBIC ONLY Blood Culture results may not be optimal due to an inadequate volume of blood received in culture bottles   Culture   Final    NO GROWTH 5 DAYS Performed at [facility], [place] [address], [place], [place] [address]    Report Status 12/05/2018 FINAL  Final  Urine culture     Status: Abnormal   Collection Time: 11/20/2018  5:28 PM  Result Value Ref Range Status   Specimen Description URINE, CATHETERIZED  Final   Special Requests NONE  Final   Culture (A)  Final    <10,000 COLONIES/mL INSIGNIFICANT GROWTH Performed at [facility], [place] [address], [place], [place] [address]    Report Status 12/01/2018 FINAL  Final  MRSA PCR  Screening     Status: Abnormal   Collection Time: 12/02/18 11:21 PM  Result Value Ref Range Status   MRSA by PCR POSITIVE (A) NEGATIVE Final    Comment:        The GeneXpert MRSA Assay (FDA approved for NASAL specimens only), is one component of a comprehensive MRSA colonization surveillance program. It is not intended to diagnose MRSA infection nor to guide or monitor treatment for MRSA infections. RESULT CALLED TO, READ BACK BY AND VERIFIED WITH: Crista Elliot RN @ [phone] ON 12/03/18 BY ROBINSON Z.  Performed at [facility], [place] [address], [place], [place] [address]   Culture, blood (routine x 2)     Status: None (Preliminary result)   Collection Time: 12/03/18  2:30 AM  Result Value Ref Range Status   Specimen Description  BLOOD LEFT ANTECUBITAL  Final   Special Requests   Final    BOTTLES DRAWN AEROBIC ONLY Blood Culture adequate volume   Culture   Final    NO GROWTH 2 DAYS Performed at [facility], [address] [place], [place] [address]    Report Status PENDING  Incomplete  Culture, blood (routine x 2)     Status: None (Preliminary result)   Collection Time: 12/03/18  2:45 AM  Result Value Ref Range Status   Specimen Description BLOOD LEFT HAND  Final   Special Requests   Final    BOTTLES DRAWN AEROBIC ONLY Blood Culture adequate volume   Culture   Final    NO GROWTH 2 DAYS Performed at [facility], [place] [address] [place], [place] [address]    Report Status PENDING  Incomplete      Radiology Studies: Dg Chest Port 1 View  Result Date: 12/06/2018 CLINICAL DATA:  Pneumonia EXAM: PORTABLE CHEST 1 VIEW COMPARISON:  12/04/2018 FINDINGS: Cardiac shadow is stable. Bilateral jugular catheters, endotracheal tube and gastric catheter are again seen and stable. Patchy multifocal infiltrate is seen although improved aeration in the right upper lobe is noted when compare with the prior study. IMPRESSION: Patchy bilateral infiltrates although significant  improved aeration in the right upper lobe is seen. Electronically Signed   By: Inez Catalina M.D.   On: 12/06/2018 07:19       LOS: 6 days   [facility] Pager on [email or web address]  12/06/2018, 11:23 AM

## 2018-12-06 NOTE — Progress Notes (Signed)
NAME:  Julia Thomas, MRN:  [id number], DOB:  04-17-1941, LOS: 6 ADMISSION DATE:  11/24/2018, CONSULTATION DATE:  5/16  REFERRING MD:  Ronaldo Miyamoto, CHIEF COMPLAINT:  Dyspnea   Brief History   78 y/o female with multiple medical problems admitted from a SNF with COVID 19 and dyspnea.    Past Medical History  Dementia CHF HTN   Significant Hospital Events   Acute blood loss anemia, 2u PRBC 5/16 VDRF 5/16  M[facility] to [facility] [address] 5/17 CVVHD 5/17 Prone position  Consults:  Renal 12/01/2018 PCCM 12/02/2018  Procedures:  ETT 5/16 >>  L IJ HD cath 5/17 >>   Significant Diagnostic Tests:  Chest x-ray-multifocal infiltrate  Micro Data:  Blood cultures 5/14  Antimicrobials:  Cefepime 5/14>> Vancomycin 5/14>>  Actemra 5/17 Steroids 5/17 >>  Paralytics 5/18 >>   Interim history/subjective:   Remains mechanically ventilated On Hemodialysis Supine position No other acute events  Objective   Blood pressure (!) 130/47, pulse 70, temperature 97.9 F (36.6 C), temperature source Oral, resp. rate (!) 35, height 5\' 4"  (1.626 m), weight 113.9 kg, SpO2 100 %.    Vent Mode: PRVC FiO2 (%):  [60 %-90 %] 60 % Set Rate:  [35 bmp] 35 bmp Vt Set:  [320 mL] 320 mL PEEP:  [14 cmH20-16 cmH20] 14 cmH20 Plateau Pressure:  [22 cmH20-28 cmH20] 28 cmH20   Intake/Output Summary (Last 24 hours) at 12/06/2018 0843 Last data filed at 12/06/2018 0800 Gross per 24 hour  Intake 3505.68 ml  Output 3148 ml  Net 357.68 ml   Filed Weights   12/04/18 0530 12/05/18 0459 12/06/18 0447  Weight: 115 kg 116.1 kg 113.9 kg    Examination:  General:  In bed on vent HENT: NCAT ETT in place PULM: CTA B, vent supported breathing CV: RRR, no mgr GI: BS+, soft, nontender MSK: normal bulk and tone Neuro: sedated on vent    Resolved Hospital Problem list   Dark stools, possible mild GI bleed  Assessment & Plan:  COVID 19 causing ARDS: severe, prognosis poor, but oxygenation improved mildly 5/20 Continue full  mechanical ventilatory support with ARDS protocol, goal PaO2 to FiO2 ratio greater than 150 Repeat ABG shows PaO2 to FiO2 ratio less than 150 so we will need to resume prone positioning Maintain neuromuscular blockade until after resuming prone positioning Ventilator associated pneumonia prevention Hold Daily wake-up assessment or spontaneous breathing trial for now Remove volume as able with CVVHD Continue methylprednisolone  Shock Continue norepinephrine titrated for MAP greater than 65  AKI Continue CVVHD for now  Dementia Need for sedation for ventilator synchrony Continue PAD protocol, RASS score -3 to -4 with fentanyl and Versed infusions D/C paralytic protocol   Nutrition Continue tube feeding   Best practice:  Diet: Tube feeding Pain/Anxiety/Delirium protocol (if indicated): RASS score -3 to -4 VAP protocol (if indicated): Yes DVT prophylaxis: Sub cutaneous heparin GI prophylaxis: Pantoprazole Glucose control: Sliding scale insulin, monitor glucose Mobility: Bedrest Code Status: limited code blue, see PCCM note from 5/19 Family Communication:  Disposition: Remain in ICU  Labs   CBC: Recent Labs  Lab 11/26/2018 1645  12/02/18 0526  12/03/18 0249  12/04/18 0245  12/05/18 0140  12/05/18 0413 12/05/18 1023 12/05/18 2251 12/06/18 0151 12/06/18 0220  WBC 8.7   < > 7.8  --  6.0  --  6.0  --  10.3  --   --   --   --   --  12.9*  NEUTROABS 6.8  --  6.1  --  4.7  --   --   --  8.1*  --   --   --   --   --   --   HGB 8.1*   < > 7.7*   < > 9.6*   < > 11.2*  11.2*   < > 11.4*   < > 11.6* 11.6* 11.6* 12.9 10.8*  HCT 26.7*   < > 25.6*   < > 29.8*   < > 33.7*  33.8*   < > 35.0*   < > 34.0* 34.0* 34.0* 38.0 34.1*  MCV 94.7   < > 93.4  --  87.4  --  86.2  --  87.1  --   --   --   --   --  88.8  PLT 185   < > 159  --  144*  --  129*  --  131*  --   --   --   --   --  102*   < > = values in this interval not displayed.    Basic Metabolic Panel: Recent Labs  Lab  12/29/18 2109  12/02/18 0526  12/03/18 0249  12/04/18 0245  12/04/18 1545  12/05/18 0140  12/05/18 1023 12/05/18 1515 12/05/18 2251 12/06/18 0151 12/06/18 0220  NA 145   < > 150*   < > 145   < > 140   < > 139   < > 140   < > 139 139 136 136 137  K 5.4*   < > 5.5*   < > 4.9   < > 4.1   < > 4.2   < > 4.2   < > 4.3 4.4 4.3 4.4 4.2  CL 119*   < > 124*  --  118*  --  109  --  107  --  106  --   --  106  --   --  104  CO2 16*   < > 14*  --  15*  --  20*  --  22  --  24  --   --  23  --   --  21*  GLUCOSE 111*   < > 127*  --  250*  --  264*  --  269*  --  194*  --   --  164*  --   --  188*  BUN 73*   < > 81*  --  82*  --  60*  --  52*  --  45*  --   --  34*  --   --  30*  CREATININE 5.90*   < > 6.55*  --  6.62*  --  3.98*  --  3.24*  --  2.57*  --   --  2.11*  --   --  1.93*  CALCIUM 7.5*   < > 8.1*  --  7.4*  --  7.3*  --  7.3*  --  7.3*  --   --  7.5*  --   --  7.2*  MG 2.1  --  2.2  --  2.2  --  2.3  --   --   --   --   --   --   --   --   --   --   PHOS 4.7*  --  5.5*  --  5.3*  --  3.4  --  4.9*  --  3.9  --   --  3.6  --   --  2.8   < > = values in this interval not displayed.   GFR: Estimated Creatinine Clearance: 30.7 mL/min (A) (by C-G formula based on SCr of 1.93 mg/dL (H)). Recent Labs  Lab 12/07/2018 1627  11/18/2018 1645 12/09/2018 1735 12/01/2018 2109  12/01/18 0615  12/03/18 0249 12/04/18 0245 12/05/18 0140 12/06/18 0220  PROCALCITON  --   --  6.78  --  7.19  --   --   --   --   --  13.40 11.32  WBC  --    < > 8.7  --  7.3   < >  --    < > 6.0 6.0 10.3 12.9*  LATICACIDVEN 1.3  --   --  1.1  --   --  0.9  --  0.9  --   --   --    < > = values in this interval not displayed.    Liver Function Tests: Recent Labs  Lab 12/17/2018 1645 12/02/18 0526 12/04/18 1545 12/05/18 0140 12/05/18 1515 12/06/18 0220  AST 35  --   --  49*  --   --   ALT 18  --   --  21  --   --   ALKPHOS 183*  --   --  196*  --   --   BILITOT 0.4  --   --  0.6  --   --   PROT 6.5  --   --  5.9*  --    --   ALBUMIN 2.7* 2.4* 2.2* 2.1*  2.2* 2.4* 2.0*   No results for input(s): LIPASE, AMYLASE in the last 168 hours. No results for input(s): AMMONIA in the last 168 hours.  ABG    Component Value Date/Time   PHART 7.325 (L) 12/06/2018 0151   PCO2ART 45.3 12/06/2018 0151   PO2ART 110.0 (H) 12/06/2018 0151   HCO3 23.6 12/06/2018 0151   TCO2 25 12/06/2018 0151   ACIDBASEDEF 3.0 (H) 12/06/2018 0151   O2SAT 98.0 12/06/2018 0151     Coagulation Profile: Recent Labs  Lab 12/03/18 0945  INR 1.1    Cardiac Enzymes: Recent Labs  Lab 11/27/2018 1645 12/05/18 0140  TROPONINI 0.11* 0.21*    HbA1C: No results found for: HGBA1C  CBG: Recent Labs  Lab 12/05/18 1534 12/05/18 1946 12/05/18 2321 12/06/18 0406 12/06/18 0803  GLUCAP 139* 168* 191* 164* 192*     Critical care time: 35 minutes       Heber CarolinaBrent McQuaid, MD [place] [facility] Pager: [phone] Cell: [phone] If no response, call [phone]

## 2018-12-06 NOTE — Progress Notes (Signed)
Patients blood gas improved through night and was able to wean slightly off FiO2.  Also weaned levo slightly.  Pulled some fluid with CRRT as requested by Dr Everardo All if patient could tolerate it.  Patient producing no urine and stool has slowed.

## 2018-12-06 NOTE — Progress Notes (Signed)
Patinet proned. Assisted with turning patients head.

## 2018-12-06 NOTE — Progress Notes (Signed)
[facility] Progress Note   Subjective:  Seen in ICU, no clotting issues, gettign 500 u/hr IV hep thru CRRT circuit. PTT 51 today. Pt proned last night, not doing much better per RN.  Keeping even on CRRT, on low dose levo gtt.   Objective Vitals:   12/06/18 0810 12/06/18 0830 12/06/18 0838 12/06/18 0900  BP: (!) 130/47 (!) 112/47    Pulse: 70 69  68  Resp: (!) 35 (!) 35  (!) 35  Temp:      TempSrc:      SpO2: 100% 100% 100% 100%  Weight:      Height:       Physical Exam Sedated and not responding, on vent,  L IV vascath Chest cta bilat Cor reg  ABd obese, not examined Ext 1+ diffuse edema , R BKA Not responsive, sedated, chemically paralyzed and sedated    Home meds:  - aspirin 81/ atorvastatin 10 hs  - insulin lantus 10u hs  - prn's/ vitamins/ supplements/ eyedrops   CXR 5/20 - no chg R> L air space disease   Assessment/Plan: # AKI on CKD: per staff at [facility] 10/2018 Cr =3.5, now with severe AKI/ ATN type picture in setting of sepsis/ shock/ COVID infection.  AKI common with COVID 19 and proteinuria is a frequent feature, though she has underlying DM and could have assoc proteinuria at baseline.  CT without hydro. No UOP now. Started CRRT on 5/17 via temp cath - I/O even last 24 hrs - IV hep 500u/ hr standing thru circuit, no clotting issues, PTT 58 - up 13kg by wts, +6 L by I/O - cont CRRT # Hypoxic respiratory failure/ COVID 19/ septic shock/ possible bacterial PNA: on HCAP antibiotics per primary, dosing vanc for renal function. Was proned yest, now supine - FiO2 improving 100% > 80% > 60% - shock improving, low dose levo only # Anemia: sp prbc's x 2 5/16. Iron sat 12% but hold on IV iron w/ infection. 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  [facility] 12/06/2018, 9:59 AM   Additional Objective Labs: Basic Metabolic Panel: Recent Labs  Lab 12/05/18 0140  12/05/18 1515 12/05/18 2251 12/06/18 0151  12/06/18 0220  NA 140   < > 139 136 136 137  K 4.2   < > 4.4 4.3 4.4 4.2  CL 106  --  106  --   --  104  CO2 24  --  23  --   --  21*  GLUCOSE 194*  --  164*  --   --  188*  BUN 45*  --  34*  --   --  30*  CREATININE 2.57*  --  2.11*  --   --  1.93*  CALCIUM 7.3*  --  7.5*  --   --  7.2*  PHOS 3.9  --  3.6  --   --  2.8   < > = values in this interval not displayed.   Liver Function Tests: Recent Labs  Lab 12/13/2018 1645  12/05/18 0140 12/05/18 1515 12/06/18 0220  AST 35  --  49*  --   --   ALT 18  --  21  --   --   ALKPHOS 183*  --  196*  --   --   BILITOT 0.4  --  0.6  --   --   PROT 6.5  --  5.9*  --   --  ALBUMIN 2.7*   < > 2.1*  2.2* 2.4* 2.0*   < > = values in this interval not displayed.   No results for input(s): LIPASE, AMYLASE in the last 168 hours. CBC: Recent Labs  Lab 12/02/18 0526  12/03/18 0249  12/04/18 0245  12/05/18 0140  12/05/18 2251 12/06/18 0151 12/06/18 0220  WBC 7.8  --  6.0  --  6.0  --  10.3  --   --   --  12.9*  NEUTROABS 6.1  --  4.7  --   --   --  8.1*  --   --   --   --   HGB 7.7*   < > 9.6*   < > 11.2*  11.2*   < > 11.4*   < > 11.6* 12.9 10.8*  HCT 25.6*   < > 29.8*   < > 33.7*  33.8*   < > 35.0*   < > 34.0* 38.0 34.1*  MCV 93.4  --  87.4  --  86.2  --  87.1  --   --   --  88.8  PLT 159  --  144*  --  129*  --  131*  --   --   --  102*   < > = values in this interval not displayed.   Blood Culture    Component Value Date/Time   SDES BLOOD LEFT HAND 12/03/2018 0245   SPECREQUEST  12/03/2018 0245    BOTTLES DRAWN AEROBIC ONLY Blood Culture adequate volume   CULT  12/03/2018 0245    NO GROWTH 2 DAYS Performed at [facility], [place] [address], [place], [place] [address]    REPTSTATUS PENDING 12/03/2018 0245    Cardiac Enzymes: Recent Labs  Lab 12/13/2018 1645 12/05/18 0140  TROPONINI 0.11* 0.21*   CBG: Recent Labs  Lab 12/05/18 1534 12/05/18 1946 12/05/18 2321 12/06/18 0406 12/06/18 0803  GLUCAP 139* 168* 191*  164* 192*   Iron Studies:  Recent Labs    12/05/18 0140  FERRITIN 1,641*   @[email or web address] @ Studies/Results: Dg Chest Port 1 View  Result Date: 12/06/2018 CLINICAL DATA:  Pneumonia EXAM: PORTABLE CHEST 1 VIEW COMPARISON:  12/04/2018 FINDINGS: Cardiac shadow is stable. Bilateral jugular catheters, endotracheal tube and gastric catheter are again seen and stable. Patchy multifocal infiltrate is seen although improved aeration in the right upper lobe is noted when compare with the prior study. IMPRESSION: Patchy bilateral infiltrates although significant improved aeration in the right upper lobe is seen. Electronically Signed   By: Inez Catalina M.D.   On: 12/06/2018 07:19   Medications: .  prismasol BGK 4/2.5 200 mL/hr at 12/05/18 1940  .  prismasol BGK 4/2.5 200 mL/hr at 12/05/18 1939  . ceFEPime (MAXIPIME) IV Stopped (12/06/18 0455)  . feeding supplement (VITAL HIGH PROTEIN) 20 mL/hr at 12/06/18 0200  . fentaNYL infusion INTRAVENOUS 250 mcg/hr (12/06/18 0900)  . heparin 10,000 units/ 20 mL infusion syringe 500 Units/hr (12/06/18 0900)  . midazolam 6 mg/hr (12/06/18 0900)  . norepinephrine (LEVOPHED) Adult infusion 5 mcg/min (12/06/18 0900)  . prismasol BGK 4/2.5 1,500 mL/hr at 12/06/18 0929  . vancomycin Stopped (12/05/18 2216)  . vecuronium (NORCURON) infusion 1 mcg/kg/min (12/06/18 0900)   . artificial tears  1 application Both Eyes Q2I  . chlorhexidine gluconate (MEDLINE KIT)  15 mL Mouth Rinse BID  . Chlorhexidine Gluconate Cloth  6 each Topical Q0600  . free water  200 mL Per Tube Q8H  . heparin  10,000 Units Subcutaneous Q8H  . insulin aspart  2 Units Subcutaneous Q4H  . insulin aspart  3-9 Units Subcutaneous Q4H  . insulin glargine  10 Units Subcutaneous Daily  . mouth rinse  15 mL Mouth Rinse 10 times per day  . methylPREDNISolone (SOLU-MEDROL) injection  40 mg Intravenous Q24H  . mupirocin ointment  1 application Nasal BID  . pantoprazole (PROTONIX) IV  40 mg Intravenous  Q12H

## 2018-12-07 ENCOUNTER — Inpatient Hospital Stay (HOSPITAL_COMMUNITY): Payer: 59

## 2018-12-07 DIAGNOSIS — L8990 Pressure ulcer of unspecified site, unspecified stage: Secondary | ICD-10-CM

## 2018-12-07 LAB — POCT I-STAT 7, (LYTES, BLD GAS, ICA,H+H)
Acid-base deficit: 5 mmol/L — ABNORMAL HIGH (ref 0.0–2.0)
Acid-base deficit: 6 mmol/L — ABNORMAL HIGH (ref 0.0–2.0)
Acid-base deficit: 4 mmol/L — ABNORMAL HIGH (ref 0.0–2.0)
Bicarbonate: 22.1 mmol/L (ref 20.0–28.0)
Bicarbonate: 20.7 mmol/L (ref 20.0–28.0)
Bicarbonate: 23 mmol/L (ref 20.0–28.0)
Calcium, Ion: 1.06 mmol/L — ABNORMAL LOW (ref 1.15–1.40)
Calcium, Ion: 1.09 mmol/L — ABNORMAL LOW (ref 1.15–1.40)
Calcium, Ion: 1.07 mmol/L — ABNORMAL LOW (ref 1.15–1.40)
HCT: 31 % — ABNORMAL LOW (ref 36.0–46.0)
HCT: 31 % — ABNORMAL LOW (ref 36.0–46.0)
HCT: 33 % — ABNORMAL LOW (ref 36.0–46.0)
Hemoglobin: 10.5 g/dL — ABNORMAL LOW (ref 12.0–15.0)
Hemoglobin: 10.5 g/dL — ABNORMAL LOW (ref 12.0–15.0)
Hemoglobin: 11.2 g/dL — ABNORMAL LOW (ref 12.0–15.0)
O2 Saturation: 70 %
O2 Saturation: 77 %
O2 Saturation: 77 %
Patient temperature: 98.3
Patient temperature: 97.89
Patient temperature: 97.3
Potassium: 4.2 mmol/L (ref 3.5–5.1)
Potassium: 4 mmol/L (ref 3.5–5.1)
Potassium: 4.5 mmol/L (ref 3.5–5.1)
Sodium: 136 mmol/L (ref 135–145)
Sodium: 135 mmol/L (ref 135–145)
Sodium: 136 mmol/L (ref 135–145)
TCO2: 24 mmol/L (ref 22–32)
TCO2: 22 mmol/L (ref 22–32)
TCO2: 24 mmol/L (ref 22–32)
pCO2 arterial: 48.6 mmHg — ABNORMAL HIGH (ref 32.0–48.0)
pCO2 arterial: 47 mmHg (ref 32.0–48.0)
pCO2 arterial: 49.2 mmHg — ABNORMAL HIGH (ref 32.0–48.0)
pH, Arterial: 7.265 — ABNORMAL LOW (ref 7.350–7.450)
pH, Arterial: 7.251 — ABNORMAL LOW (ref 7.350–7.450)
pH, Arterial: 7.274 — ABNORMAL LOW (ref 7.350–7.450)
pO2, Arterial: 42 mmHg — ABNORMAL LOW (ref 83.0–108.0)
pO2, Arterial: 47 mmHg — ABNORMAL LOW (ref 83.0–108.0)
pO2, Arterial: 46 mmHg — ABNORMAL LOW (ref 83.0–108.0)

## 2018-12-07 LAB — FERRITIN: Ferritin: 51 ng/mL (ref 11–307)

## 2018-12-07 LAB — RENAL FUNCTION PANEL
Albumin: 2.1 g/dL — ABNORMAL LOW (ref 3.5–5.0)
Albumin: 2.3 g/dL — ABNORMAL LOW (ref 3.5–5.0)
Anion gap: 10 (ref 5–15)
Anion gap: 8 (ref 5–15)
BUN: 27 mg/dL — ABNORMAL HIGH (ref 8–23)
BUN: 26 mg/dL — ABNORMAL HIGH (ref 8–23)
CO2: 22 mmol/L (ref 22–32)
CO2: 25 mmol/L (ref 22–32)
Calcium: 7.2 mg/dL — ABNORMAL LOW (ref 8.9–10.3)
Calcium: 7.3 mg/dL — ABNORMAL LOW (ref 8.9–10.3)
Chloride: 103 mmol/L (ref 98–111)
Chloride: 103 mmol/L (ref 98–111)
Creatinine, Ser: 1.59 mg/dL — ABNORMAL HIGH (ref 0.44–1.00)
Creatinine, Ser: 1.51 mg/dL — ABNORMAL HIGH (ref 0.44–1.00)
GFR calc Af Amer: 36 mL/min — ABNORMAL LOW (ref >60)
GFR calc Af Amer: 39 mL/min — ABNORMAL LOW (ref >60)
GFR calc non Af Amer: 31 mL/min — ABNORMAL LOW (ref >60)
GFR calc non Af Amer: 33 mL/min — ABNORMAL LOW (ref >60)
Glucose, Bld: 218 mg/dL — ABNORMAL HIGH (ref 70–99)
Glucose, Bld: 212 mg/dL — ABNORMAL HIGH (ref 70–99)
Phosphorus: 2.4 mg/dL — ABNORMAL LOW (ref 2.5–4.6)
Phosphorus: 2.4 mg/dL — ABNORMAL LOW (ref 2.5–4.6)
Potassium: 4.3 mmol/L (ref 3.5–5.1)
Potassium: 4.6 mmol/L (ref 3.5–5.1)
Sodium: 135 mmol/L (ref 135–145)
Sodium: 136 mmol/L (ref 135–145)

## 2018-12-07 LAB — CBC WITH DIFFERENTIAL/PLATELET
Abs Immature Granulocytes: 0.25 10*3/uL — ABNORMAL HIGH (ref 0.00–0.07)
Basophils Absolute: 0 10*3/uL (ref 0.0–0.1)
Basophils Relative: 0 %
Eosinophils Absolute: 0 10*3/uL (ref 0.0–0.5)
Eosinophils Relative: 0 %
HCT: 34.8 % — ABNORMAL LOW (ref 36.0–46.0)
Hemoglobin: 11 g/dL — ABNORMAL LOW (ref 12.0–15.0)
Immature Granulocytes: 2 %
Lymphocytes Relative: 8 %
Lymphs Abs: 1.2 10*3/uL (ref 0.7–4.0)
MCH: 28.1 pg (ref 26.0–34.0)
MCHC: 31.6 g/dL (ref 30.0–36.0)
MCV: 88.8 fL (ref 80.0–100.0)
Monocytes Absolute: 0.5 10*3/uL (ref 0.1–1.0)
Monocytes Relative: 3 %
Neutro Abs: 12.5 10*3/uL — ABNORMAL HIGH (ref 1.7–7.7)
Neutrophils Relative %: 87 %
Platelets: 100 10*3/uL — ABNORMAL LOW (ref 150–400)
RBC: 3.92 MIL/uL (ref 3.87–5.11)
RDW: 15.4 % (ref 11.5–15.5)
WBC: 14.5 10*3/uL — ABNORMAL HIGH (ref 4.0–10.5)
nRBC: 0.9 % — ABNORMAL HIGH (ref 0.0–0.2)

## 2018-12-07 LAB — PROCALCITONIN: Procalcitonin: 5.8 ng/mL

## 2018-12-07 LAB — HEMOGLOBIN A1C
Hgb A1c MFr Bld: 6.6 % — ABNORMAL HIGH (ref 4.8–5.6)
Mean Plasma Glucose: 142.72 mg/dL

## 2018-12-07 LAB — GLUCOSE, CAPILLARY
Glucose-Capillary: 183 mg/dL — ABNORMAL HIGH (ref 70–99)
Glucose-Capillary: 184 mg/dL — ABNORMAL HIGH (ref 70–99)
Glucose-Capillary: 191 mg/dL — ABNORMAL HIGH (ref 70–99)
Glucose-Capillary: 194 mg/dL — ABNORMAL HIGH (ref 70–99)
Glucose-Capillary: 200 mg/dL — ABNORMAL HIGH (ref 70–99)
Glucose-Capillary: 211 mg/dL — ABNORMAL HIGH (ref 70–99)

## 2018-12-07 LAB — T4, FREE: Free T4: 1.05 ng/dL (ref 0.82–1.77)

## 2018-12-07 LAB — INTERLEUKIN-6, PLASMA: Interleukin-6, Plasma: >3000 pg/mL — AB (ref 0.0–12.2)

## 2018-12-07 LAB — APTT: aPTT: 102 seconds — ABNORMAL HIGH (ref 24–36)

## 2018-12-07 LAB — D-DIMER, QUANTITATIVE: D-Dimer, Quant: 1.18 ug/mL-FEU — ABNORMAL HIGH (ref 0.00–0.50)

## 2018-12-07 LAB — C-REACTIVE PROTEIN: CRP: 6.1 mg/dL — ABNORMAL HIGH (ref <1.0)

## 2018-12-07 MED ORDER — NOREPINEPHRINE 16 MG/250ML-% IV SOLN
0.0000 ug/min | INTRAVENOUS | Status: DC
  Administered 2018-12-07 – 2018-12-08 (×2): 10 ug/min via INTRAVENOUS
  Filled 2018-12-07 (×2): qty 250

## 2018-12-07 NOTE — Progress Notes (Signed)
PROGRESS NOTE  Julia Thomas VEL:[id number] DOB: 04/10/1941 DOA: 11/26/2018  PCP: Leanna Battles, MD  Brief History/Interval Summary: 78 year old African-American female, skilled nursing facility resident, with past medical history significant for recently positive test for COVID 19, chronic kidney disease with unknown baseline, diabetes mellitus, hypertension, CHF, dementia amongst other past medical and cardiac history.  At the skilled nursing facility patient was found to have poor oral intake.  Blood work revealed acute renal failure.  Patient was transferred to the hospital.  Patient became progressively hypoxic.  Transferred to the intensive care unit.  Subsequently intubated.  Also developed acute renal failure requiring CRRT.  Reason for Visit: Acute respiratory failure with hypoxia secondary to COVID-19  Consultants: Pulmonology.  Nephrology.  Procedures:  Intubated Dialysis catheter placement 5/17 On CRRT  Antibiotics: Anti-infectives (From admission, onward)   Start     Dose/Rate Route Frequency Ordered Stop   12/03/18 2200  vancomycin (VANCOCIN) IVPB 1000 mg/200 mL premix     1,000 mg 200 mL/hr over 60 Minutes Intravenous Every 24 hours 12/03/18 1115 12/06/18 2343   12/03/18 1600  ceFEPIme (MAXIPIME) 2 g in sodium chloride 0.9 % 100 mL IVPB     2 g 200 mL/hr over 30 Minutes Intravenous Every 12 hours 12/03/18 1115 12/06/18 1534   12/02/18 2000  vancomycin (VANCOCIN) IVPB 1000 mg/200 mL premix     1,000 mg 200 mL/hr over 60 Minutes Intravenous  Once 12/02/18 1043 12/02/18 2130   12/01/18 2200  ceFEPIme (MAXIPIME) 1 g in sodium chloride 0.9 % 100 mL IVPB  Status:  Discontinued     1 g 200 mL/hr over 30 Minutes Intravenous Every 24 hours 12/01/18 1227 12/03/18 1115   12/01/18 1800  ceFEPIme (MAXIPIME) 1 g in sodium chloride 0.9 % 100 mL IVPB  Status:  Discontinued     1 g 200 mL/hr over 30 Minutes Intravenous Every 24 hours 11/17/2018 1831 12/14/2018 2040   12/01/18 0754   vancomycin variable dose per unstable renal function (pharmacist dosing)  Status:  Discontinued      Does not apply See admin instructions 12/01/18 0755 12/03/18 1115   12/08/2018 1830  vancomycin variable dose per unstable renal function (pharmacist dosing)  Status:  Discontinued      Does not apply See admin instructions 11/22/2018 1831 11/19/2018 2040   11/22/2018 1700  vancomycin (VANCOCIN) 1,750 mg in sodium chloride 0.9 % 500 mL IVPB     1,750 mg 250 mL/hr over 120 Minutes Intravenous  Once 11/18/2018 1646 11/29/2018 2102   11/20/2018 1700  ceFEPIme (MAXIPIME) 2 g in sodium chloride 0.9 % 100 mL IVPB     2 g 200 mL/hr over 30 Minutes Intravenous  Once 12/15/2018 1646 11/17/2018 1754       Subjective/Interval History: Patient remains intubated and sedated.    Assessment/Plan:  Acute Hypoxic Resp. Failure due to Acute Covid 19 Viral Illness  Vent Mode: PRVC FiO2 (%):  [60 %-80 %] 80 % Set Rate:  [35 bmp] 35 bmp Vt Set:  [320 mL] 320 mL PEEP:  [10 cmH20] 10 cmH20 Plateau Pressure:  [21 cmH20-25 cmH20] 25 cmH20     Component Value Date/Time   PHART 7.274 (L) 12/07/2018 1156   PCO2ART 49.2 (H) 12/07/2018 1156   PO2ART 46.0 (L) 12/07/2018 1156   HCO3 23.0 12/07/2018 1156   TCO2 24 12/07/2018 1156   ACIDBASEDEF 4.0 (H) 12/07/2018 1156   O2SAT 77.0 12/07/2018 1156    COVID-19 Labs  Recent Labs    12/04/18  1545 12/05/18 0140 12/07/18 0240  DDIMER  --  2.14* 1.18*  FERRITIN  --  1,641* 51  LDH  --  640*  --   CRP 25.5* 22.4* 6.1*     Fever: She was last febrile on 5/15-5/16.  Has had hypothermia over the last 2 to 3 days.   Oxygen requirements: On mechanical ventilation.  80% FiO2.  Saturating in the 90s.   Antibiotics: On vancomycin and cefepime Steroids: Remains on Solu-Medrol.  Dose has been decreased Diuretics: Not on scheduled diuretics.  Volume being managed by CRRT Actemra: Has received 2 doses, 5/17 and 5/18 Convalescent Plasma: Not yet  Patient remains mechanically  ventilated.  Pulmonology is following.  Chest x-ray from yesterday did show improvement but patient's oxygenation has worsened today.  Repeat chest x-ray to be done today.  Patient remains on broad-spectrum antibiotics.  Procalcitonin level was significantly elevated at 11.32.  Improved to 5.80 today.  Will favor continuing broad-spectrum antibiotics till procalcitonin has improved significantly.  MRSA PCR was positive.  Blood cultures have been negative.  CRP improved to 6.1.  D-dimer improved to 1.18.  Ferritin noted to be 51, this could be erroneous. Patient has received 2 doses of Actemra and remains on steroids as discussed above.  Her WBC has been noted to be climbing over the last 2 days.  14.5 today.  Recheck tomorrow.  Follow-up on chest x-ray.  Tracheal aspirate for culture.  The treatment plan and use of medications and known side effects were discussed with patient/family. It was clearly explained that there is no proven definitive treatment for COVID-19 infection yet. Any medications used here are based on case reports/anecdotal data which are not peer-reviewed and has not been studied using randomized control trials.  Complete risks and long-term side effects are unknown, however in the best clinical judgment they seem to be of some clinical benefit rather than medical risks.  Patient/family agree with the treatment plan and want to receive these treatments as indicated.  Patient was subsequently given Actemra.  Hypothermia Most likely due to acute illness/sepsis/CRRT.  TSH noted to be low at 0.156.  Could be sick euthyroid.  Free T4 is 1.05.  Cortisol 11.4 although patient has been on Solu-Medrol blood cultures negative so far.  Temperature curve has improved.  Acute kidney injury on chronic kidney disease with normal anion gap metabolic acidosis Patient remains on CRRT.  Nephrology is following and managing.  Very minimal urine output.  Discussed with nephrology.  Diabetes mellitus type II  with hyperglycemia Hyperglycemia secondary to steroids.  Currently on Lantus and SSI.  CBGs are reasonably well controlled.  Should improve as steroid is tapered down and discontinued.  HbA1c is 6.6.    Septic shock Improvement in blood pressure noted.  Remains on vasopressors.    Questionable acute blood loss anemia During the earlier part of this hospitalization her hemoglobin dropped to 5.4. Patient was noted to have dark stools.  Unclear if patient truly had GI bleed.  Patient was transfused.  Hemoglobin has been stable subsequently.  Continue to monitor.   Concern for GI bleeding Apparently had dark stool.  Had a drop in hemoglobin requiring blood transfusion.  Hemoglobin has been stable since transfusion.  Has not had any further dark or melanotic stools.  She was started on DVT prophylaxis with subcutaneous heparin.  Continue PPI.    Thrombocytopenia Platelet count remains low.  No significant change.  Continue to monitor for now.    History of dementia  Supportive measures.  FEN On CRRT.  Not on any IV fluids.  Monitor electrolytes.  Continue tube feedings.  On free water.     DVT Prophylaxis: Subcutaneous heparin 10,000 units every 8 hours  Lab Results  Component Value Date   PLT 100 (L) 12/07/2018     PUD Prophylaxis: PPI every 12 hours Code Status: Partial code Family Communication: PCCM to discuss with family Disposition Plan: Unclear.  Continue ICU care.   Medications:  Scheduled: . chlorhexidine gluconate (MEDLINE KIT)  15 mL Mouth Rinse BID  . Chlorhexidine Gluconate Cloth  6 each Topical Q0600  . free water  200 mL Per Tube Q8H  . heparin  10,000 Units Subcutaneous Q8H  . insulin aspart  2 Units Subcutaneous Q4H  . insulin aspart  3-9 Units Subcutaneous Q4H  . insulin glargine  10 Units Subcutaneous Daily  . mouth rinse  15 mL Mouth Rinse 10 times per day  . methylPREDNISolone (SOLU-MEDROL) injection  40 mg Intravenous Q24H  . pantoprazole (PROTONIX) IV   40 mg Intravenous Q12H   Continuous: .  prismasol BGK 4/2.5 200 mL/hr at 12/05/18 1940  .  prismasol BGK 4/2.5 200 mL/hr at 12/06/18 2300  . feeding supplement (VITAL HIGH PROTEIN) 20 mL/hr at 12/07/18 1200  . fentaNYL infusion INTRAVENOUS 100 mcg/hr (12/07/18 1300)  . heparin 10,000 units/ 20 mL infusion syringe 500 Units/hr (12/07/18 1300)  . midazolam 2 mg/hr (12/07/18 1300)  . norepinephrine (LEVOPHED) Adult infusion 6 mcg/min (12/07/18 1300)  . prismasol BGK 4/2.5 1,500 mL/hr at 12/07/18 1300   FGH:[id number], heparin, heparin, hydrALAZINE, midazolam   Objective:  Vital Signs  Vitals:   12/07/18 1115 12/07/18 1130 12/07/18 1145 12/07/18 1300  BP:    (!) 90/42  Pulse: 73 71 66 63  Resp: (!) 35 (!) 35 (!) 35   Temp:  97.6 F (36.4 C)    TempSrc:  Oral    SpO2: 94% 94% 94% 94%  Weight:      Height:        Intake/Output Summary (Last 24 hours) at 12/07/2018 1323 Last data filed at 12/07/2018 1300 Gross per 24 hour  Intake 1987.01 ml  Output 2305 ml  Net -317.99 ml   Filed Weights   12/05/18 0459 12/06/18 0447 12/07/18 0545  Weight: 116.1 kg 113.9 kg 113.8 kg   General appearance: Intubated and sedated Resp: Coarse breath sounds bilaterally.  No wheezing rales or rhonchi.   Cardio: S1-S2 is normal regular.  No S3-S4.  No rubs murmurs or bruit GI: Abdomen is soft.  Nontender nondistended.  Bowel sounds are present normal.  No masses organomegaly Extremities: Minimal edema bilateral lower extremities Neurologic: Sedated and paralyzed.   Lab Results:  Data Reviewed: I have personally reviewed following labs and imaging studies  CBC: Recent Labs  Lab 11/20/2018 1645  12/02/18 0526  12/03/18 0249  12/04/18 0245  12/05/18 0140  12/06/18 0220 12/06/18 0902 12/07/18 0240 12/07/18 0515 12/07/18 0706 12/07/18 1156  WBC 8.7   < > 7.8  --  6.0  --  6.0  --  10.3  --  12.9*  --  14.5*  --   --   --   NEUTROABS 6.8  --  6.1  --  4.7  --   --   --  8.1*  --   --   --   12.5*  --   --   --   HGB 8.1*   < > 7.7*   < >  9.6*   < > 11.2*  11.2*   < > 11.4*   < > 10.8* 10.5* 11.0* 10.5* 10.5* 11.2*  HCT 26.7*   < > 25.6*   < > 29.8*   < > 33.7*  33.8*   < > 35.0*   < > 34.1* 31.0* 34.8* 31.0* 31.0* 33.0*  MCV 94.7   < > 93.4  --  87.4  --  86.2  --  87.1  --  88.8  --  88.8  --   --   --   PLT 185   < > 159  --  144*  --  129*  --  131*  --  102*  --  100*  --   --   --    < > = values in this interval not displayed.    Basic Metabolic Panel: Recent Labs  Lab 12/06/2018 2109  12/02/18 0526  12/03/18 0249  12/04/18 0245  12/05/18 0140  12/05/18 1515  12/06/18 0220  12/06/18 1655 12/07/18 0240 12/07/18 0515 12/07/18 0706 12/07/18 1156  NA 145   < > 150*   < > 145   < > 140   < > 140   < > 139   < > 137   < > 136 135 136 135 136  K 5.4*   < > 5.5*   < > 4.9   < > 4.1   < > 4.2   < > 4.4   < > 4.2   < > 4.2 4.3 4.2 4.0 4.5  CL 119*   < > 124*  --  118*  --  109   < > 106  --  106  --  104  --  104 103  --   --   --   CO2 16*   < > 14*  --  15*  --  20*   < > 24  --  23  --  21*  --  23 22  --   --   --   GLUCOSE 111*   < > 127*  --  250*  --  264*   < > 194*  --  164*  --  188*  --  212* 218*  --   --   --   BUN 73*   < > 81*  --  82*  --  60*   < > 45*  --  34*  --  30*  --  27* 27*  --   --   --   CREATININE 5.90*   < > 6.55*  --  6.62*  --  3.98*   < > 2.57*  --  2.11*  --  1.93*  --  1.73* 1.59*  --   --   --   CALCIUM 7.5*   < > 8.1*  --  7.4*  --  7.3*   < > 7.3*  --  7.5*  --  7.2*  --  7.3* 7.2*  --   --   --   MG 2.1  --  2.2  --  2.2  --  2.3  --   --   --   --   --   --   --   --   --   --   --   --   PHOS 4.7*  --  5.5*  --  5.3*  --  3.4   < > 3.9  --  3.6  --  2.8  --  2.4* 2.4*  --   --   --    < > = values in this interval not displayed.    GFR: Estimated Creatinine Clearance: 37.2 mL/min (A) (by C-G formula based on SCr of 1.59 mg/dL (H)).  Liver Function Tests: Recent Labs  Lab 11/21/2018 1645  12/05/18 0140 12/05/18 1515  12/06/18 0220 12/06/18 1655 12/07/18 0240  AST 35  --  49*  --   --   --   --   ALT 18  --  21  --   --   --   --   ALKPHOS 183*  --  196*  --   --   --   --   BILITOT 0.4  --  0.6  --   --   --   --   PROT 6.5  --  5.9*  --   --   --   --   ALBUMIN 2.7*   < > 2.1*  2.2* 2.4* 2.0* 2.0* 2.1*   < > = values in this interval not displayed.     Coagulation Profile: Recent Labs  Lab 12/03/18 0945  INR 1.1    Cardiac Enzymes: Recent Labs  Lab 11/26/2018 1645 12/05/18 0140  TROPONINI 0.11* 0.21*     CBG: Recent Labs  Lab 12/06/18 1943 12/06/18 2325 12/07/18 0314 12/07/18 0749 12/07/18 1129  GLUCAP 178* 205* 194* 211* 184*    Anemia Panel: Recent Labs    12/05/18 0140 12/07/18 0240  FERRITIN 1,641* 51    Recent Results (from the past 240 hour(s))  Blood Culture (routine x 2)     Status: None   Collection Time: 11/29/2018  4:45 PM  Result Value Ref Range Status   Specimen Description BLOOD RIGHT HAND  Final   Special Requests   Final    BOTTLES DRAWN AEROBIC AND ANAEROBIC Blood Culture adequate volume   Culture   Final    NO GROWTH 5 DAYS Performed at [facility], [place] [address] [place], [place] [address]    Report Status 12/05/2018 FINAL  Final  Blood Culture (routine x 2)     Status: None   Collection Time: 12/12/2018  5:10 PM  Result Value Ref Range Status   Specimen Description BLOOD LEFT HAND  Final   Special Requests   Final    BOTTLES DRAWN AEROBIC ONLY Blood Culture results may not be optimal due to an inadequate volume of blood received in culture bottles   Culture   Final    NO GROWTH 5 DAYS Performed at [facility], [place] [address] [place], [place] [address]    Report Status 12/05/2018 FINAL  Final  Urine culture     Status: Abnormal   Collection Time: 12/11/2018  5:28 PM  Result Value Ref Range Status   Specimen Description URINE, CATHETERIZED  Final   Special Requests NONE  Final   Culture (A)  Final    <10,000 COLONIES/mL  INSIGNIFICANT GROWTH Performed at [facility], [place] [address] [place], [place] [address]    Report Status 12/01/2018 FINAL  Final  MRSA PCR Screening     Status: Abnormal   Collection Time: 12/02/18 11:21 PM  Result Value Ref Range Status   MRSA by PCR POSITIVE (A) NEGATIVE Final    Comment:        The GeneXpert MRSA Assay (FDA approved for NASAL specimens only), is one component of  a comprehensive MRSA colonization surveillance program. It is not intended to diagnose MRSA infection nor to guide or monitor treatment for MRSA infections. RESULT CALLED TO, READ BACK BY AND VERIFIED WITH: Crista Elliot RN @ [phone] ON 12/03/18 BY ROBINSON Z.  Performed at [facility], [place] [address], [place], [place] [address]   Culture, blood (routine x 2)     Status: None (Preliminary result)   Collection Time: 12/03/18  2:30 AM  Result Value Ref Range Status   Specimen Description BLOOD LEFT ANTECUBITAL  Final   Special Requests   Final    BOTTLES DRAWN AEROBIC ONLY Blood Culture adequate volume   Culture   Final    NO GROWTH 4 DAYS Performed at [facility], [place] [address] [place], [place] [address]    Report Status PENDING  Incomplete  Culture, blood (routine x 2)     Status: None (Preliminary result)   Collection Time: 12/03/18  2:45 AM  Result Value Ref Range Status   Specimen Description BLOOD LEFT HAND  Final   Special Requests   Final    BOTTLES DRAWN AEROBIC ONLY Blood Culture adequate volume   Culture   Final    NO GROWTH 4 DAYS Performed at [facility], [place] [address], [place], [place] [address]    Report Status PENDING  Incomplete      Radiology Studies: Dg Chest Port 1 View  Result Date: 12/06/2018 CLINICAL DATA:  Pneumonia EXAM: PORTABLE CHEST 1 VIEW COMPARISON:  12/04/2018 FINDINGS: Cardiac shadow is stable. Bilateral jugular catheters, endotracheal tube and gastric catheter are again seen and stable. Patchy multifocal infiltrate is seen although  improved aeration in the right upper lobe is noted when compare with the prior study. IMPRESSION: Patchy bilateral infiltrates although significant improved aeration in the right upper lobe is seen. Electronically Signed   By: Inez Catalina M.D.   On: 12/06/2018 07:19       LOS: 7 days   [facility] Pager on [email or web address]  12/07/2018, 1:23 PM

## 2018-12-07 NOTE — Progress Notes (Signed)
Patient remains proned. Head turned to the right. Tolerated well.

## 2018-12-07 NOTE — Progress Notes (Signed)
Pt BP and O2 sats slowly dropping since change of shift. O2 dropped and maintained in mid 80's at 2250 with SBP dropping into the 70's. RT notified and ventilator settings changed to 100% and 12 PEEP. Dr. Craige Cotta at bedside and in agreement to maintain O2 sats above 88% and SBP above 90. Pt corresponding well to interventions at this time. Will continue to monitor.

## 2018-12-07 NOTE — Progress Notes (Signed)
CRRT machine alarming scale malfunction on machine. Unable to resolve alarm through troubleshooting procedures. At 2150 CRRT stopped, unable to return blood. Filter changed and CRRT restarted at 2300.

## 2018-12-07 NOTE — Progress Notes (Addendum)
NAME:  Fidela SalisburyDorothy Rothbauer, MRN:  [id number], DOB:  03/07/1941, LOS: 7 ADMISSION DATE:  Dec 13, 2018, CONSULTATION DATE:  5/16  REFERRING MD:  Ronaldo MiyamotoKyle, CHIEF COMPLAINT:  Dyspnea   Brief History   78 y/o female with multiple medical problems admitted from a SNF with COVID 19 and dyspnea.    Past Medical History  Dementia CHF HTN  Significant Hospital Events   Acute blood loss anemia, 2u PRBC 5/16 VDRF 5/16  [facility] 5/17 Prone position  Consults:  Renal 12/01/2018 PCCM 12/02/2018  Procedures:  ETT 5/16 >>  L IJ HD cath 5/17 >>   Significant Diagnostic Tests:  Chest x-ray-multifocal infiltrate  Micro Data:  Blood cultures 5/14  Antimicrobials:  Cefepime 5/14>> Vancomycin 5/14>>  Actemra 5/17 Steroids 5/17 >>  Paralytics 5/18 >>   Interim history/subjective:   Oxygen needs worse today   Objective   Blood pressure (!) 137/42, pulse 72, temperature 97.8 F (36.6 C), resp. rate (!) 35, height 5\' 4"  (1.626 m), weight 113.8 kg, SpO2 94 %.    Vent Mode: PRVC FiO2 (%):  [60 %-80 %] 80 % Set Rate:  [35 bmp] 35 bmp Vt Set:  [320 mL] 320 mL PEEP:  [10 cmH20-12 cmH20] 10 cmH20 Plateau Pressure:  [17 cmH20-25 cmH20] 25 cmH20   Intake/Output Summary (Last 24 hours) at 12/07/2018 0856 Last data filed at 12/07/2018 0700 Gross per 24 hour  Intake 2111.33 ml  Output 2513 ml  Net -401.67 ml   Filed Weights   12/05/18 0459 12/06/18 0447 12/07/18 0545  Weight: 116.1 kg 113.9 kg 113.8 kg    Examination:  General:  In bed on vent HENT: NCAT ETT in place PULM: CTA B, vent supported breathing CV: RRR, no mgr GI: BS+, soft, nontender MSK: normal bulk and tone Neuro: sedated on vent   Resolved Hospital Problem list   Dark stools, possible mild GI bleed  Assessment & Plan:  COVID 19 causing ARDS: severe, prognosis poor, oxygenation worse again on 5/21 Will repeat chest x-ray today because of worsening oxygenation At this time too unstable to place in the  prone position, will hold off on doing that for now Continue full mechanical ventilatory support with ARDS protocol, target PaO2 to FiO2 ratio greater than 150 Hold neuromuscular blockade again today Continue sedation titrated to a RA SS score of -3 to -4 Remove volume as able with CVVHD Continue methylprednisolone  Shock Continue norepinephrine titrated for SBP > 90   AKI Continue CVVHD for now   Dementia Continue PAD protocol, RA SS score -3 to -4 with fentanyl and Versed infusions DC paralytic protocol  Nutrition Continue tube feeding   Best practice:  Diet: Tube feeding Pain/Anxiety/Delirium protocol (if indicated): RASS score -3 to -4 VAP protocol (if indicated): Yes DVT prophylaxis: Sub cutaneous heparin GI prophylaxis: Pantoprazole Glucose control: Sliding scale insulin, monitor glucose Mobility: Bedrest Code Status: limited code blue, see PCCM note from 5/19 Family Communication: Today I was able to update her daughter Thelma BargeFrancis, I let her know that her oxygen needs were worse and I am concerned that her prognosis may be poor and that we could not position the patient in the prone position today. Disposition: Remain in ICU  Labs   CBC: Recent Labs  Lab 07-Jun-2019 1645  12/02/18 0526  12/03/18 0249  12/04/18 0245  12/05/18 0140  12/06/18 0220 12/06/18 0902 12/07/18 0240 12/07/18 0515 12/07/18 0706  WBC 8.7   < > 7.8  --  6.0  --  6.0  --  10.3  --  12.9*  --  14.5*  --   --   NEUTROABS 6.8  --  6.1  --  4.7  --   --   --  8.1*  --   --   --  12.5*  --   --   HGB 8.1*   < > 7.7*   < > 9.6*   < > 11.2*  11.2*   < > 11.4*   < > 10.8* 10.5* 11.0* 10.5* 10.5*  HCT 26.7*   < > 25.6*   < > 29.8*   < > 33.7*  33.8*   < > 35.0*   < > 34.1* 31.0* 34.8* 31.0* 31.0*  MCV 94.7   < > 93.4  --  87.4  --  86.2  --  87.1  --  88.8  --  88.8  --   --   PLT 185   < > 159  --  144*  --  129*  --  131*  --  102*  --  100*  --   --    < > = values in this interval not displayed.     Basic Metabolic Panel: Recent Labs  Lab 12-03-2018 2109  12/02/18 0526  12/03/18 0249  12/04/18 0245  12/05/18 0140  12/05/18 1515  12/06/18 0220 12/06/18 0902 12/06/18 1655 12/07/18 0240 12/07/18 0515 12/07/18 0706  NA 145   < > 150*   < > 145   < > 140   < > 140   < > 139   < > 137 135 136 135 136 135  K 5.4*   < > 5.5*   < > 4.9   < > 4.1   < > 4.2   < > 4.4   < > 4.2 5.2* 4.2 4.3 4.2 4.0  CL 119*   < > 124*  --  118*  --  109   < > 106  --  106  --  104  --  104 103  --   --   CO2 16*   < > 14*  --  15*  --  20*   < > 24  --  23  --  21*  --  23 22  --   --   GLUCOSE 111*   < > 127*  --  250*  --  264*   < > 194*  --  164*  --  188*  --  212* 218*  --   --   BUN 73*   < > 81*  --  82*  --  60*   < > 45*  --  34*  --  30*  --  27* 27*  --   --   CREATININE 5.90*   < > 6.55*  --  6.62*  --  3.98*   < > 2.57*  --  2.11*  --  1.93*  --  1.73* 1.59*  --   --   CALCIUM 7.5*   < > 8.1*  --  7.4*  --  7.3*   < > 7.3*  --  7.5*  --  7.2*  --  7.3* 7.2*  --   --   MG 2.1  --  2.2  --  2.2  --  2.3  --   --   --   --   --   --   --   --   --   --   --  PHOS 4.7*  --  5.5*  --  5.3*  --  3.4   < > 3.9  --  3.6  --  2.8  --  2.4* 2.4*  --   --    < > = values in this interval not displayed.   GFR: Estimated Creatinine Clearance: 37.2 mL/min (A) (by C-G formula based on SCr of 1.59 mg/dL (H)). Recent Labs  Lab 12/13/2018 1627  12/09/2018 1735 11/20/2018 2109  12/01/18 0615  12/03/18 0249 12/04/18 0245 12/05/18 0140 12/06/18 0220 12/07/18 0240  PROCALCITON  --    < >  --  7.19  --   --   --   --   --  13.40 11.32 5.80  WBC  --    < >  --  7.3   < >  --    < > 6.0 6.0 10.3 12.9* 14.5*  LATICACIDVEN 1.3  --  1.1  --   --  0.9  --  0.9  --   --   --   --    < > = values in this interval not displayed.    Liver Function Tests: Recent Labs  Lab 12/16/2018 1645  12/05/18 0140 12/05/18 1515 12/06/18 0220 12/06/18 1655 12/07/18 0240  AST 35  --  49*  --   --   --   --   ALT 18  --   21  --   --   --   --   ALKPHOS 183*  --  196*  --   --   --   --   BILITOT 0.4  --  0.6  --   --   --   --   PROT 6.5  --  5.9*  --   --   --   --   ALBUMIN 2.7*   < > 2.1*  2.2* 2.4* 2.0* 2.0* 2.1*   < > = values in this interval not displayed.   No results for input(s): LIPASE, AMYLASE in the last 168 hours. No results for input(s): AMMONIA in the last 168 hours.  ABG    Component Value Date/Time   PHART 7.251 (L) 12/07/2018 0706   PCO2ART 47.0 12/07/2018 0706   PO2ART 47.0 (L) 12/07/2018 0706   HCO3 20.7 12/07/2018 0706   TCO2 22 12/07/2018 0706   ACIDBASEDEF 6.0 (H) 12/07/2018 0706   O2SAT 77.0 12/07/2018 0706     Coagulation Profile: Recent Labs  Lab 12/03/18 0945  INR 1.1    Cardiac Enzymes: Recent Labs  Lab 12/07/2018 1645 12/05/18 0140  TROPONINI 0.11* 0.21*    HbA1C: Hgb A1c MFr Bld  Date/Time Value Ref Range Status  12/07/2018 02:40 AM 6.6 (H) 4.8 - 5.6 % Final    Comment:    (NOTE) Pre diabetes:          5.7%-6.4% Diabetes:              >6.4% Glycemic control for   <7.0% adults with diabetes     CBG: Recent Labs  Lab 12/06/18 1549 12/06/18 1943 12/06/18 2325 12/07/18 0314 12/07/18 0749  GLUCAP 196* 178* 205* 194* 211*     Critical care time: 35 minutes       Heber Cramerton, MD [facility] Pager: [phone] Cell: [phone] If no response, call [phone]

## 2018-12-07 NOTE — Progress Notes (Addendum)
[facility] Progress Note   Subjective:  Discussed w/ primary MD, still sig resp issues  Objective Vitals:   12/07/18 1415 12/07/18 1430 12/07/18 1445 12/07/18 1500  BP:    (!) 106/53  Pulse: 68 65 69 67  Resp: (!) 31 (!) 35 (!) 26 (!) 5  Temp:      TempSrc:      SpO2: 92% (!) 89% 91% 92%  Weight:      Height:       Physical Exam  Patient not examined directly given COVID-19 + status, utilizing exam of the primary team and observations of RN's.     Home meds:  - aspirin 81/ atorvastatin 10 hs  - insulin lantus 10u hs  - prn's/ vitamins/ supplements/ eyedrops   CXR 5/20 - no chg R> L air space disease   Assessment/Plan: # AKI on CKD: per staff at [facility] 10/2018 Cr =3.5, now with severe AKI/ ATN type picture in setting of sepsis/ shock/ COVID infection.  AKI common with COVID 19 and proteinuria is a frequent feature, though she has underlying DM and could have assoc proteinuria at baseline.  CT without hydro. No UOP now. Started CRRT on 5/17 via temp cath - I/O even last 24 hrs, no UOP - FiO2 worse, CXR today no changes - IV hep 500u/ hr thru circuit, PTT up 102 sec >> will decrease IV heparin to 250 cc/ hr - up 13kg by wts, +6 L by I/O - cont CRRT # Hypoxic respiratory failure/ COVID 19/ septic shock/ possible bacterial PNA: on HCAP antibiotics per primary, dosing vanc for renal function. Was proned yest, now supine - FiO2 improving 100% > 80% > 60% > 80% today - low dose levo only # Anemia: sp prbc's x 2 5/16. Iron sat 12% but hold on IV iron w/ infection. 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 # Prognosis - poor  Rob  [facility] 12/07/2018, 3:59 PM   Additional Objective Labs: Basic Metabolic Panel: Recent Labs  Lab 12/06/18 0220  12/06/18 1655 12/07/18 0240 12/07/18 0515 12/07/18 0706 12/07/18 1156  NA 137   < > 136 135 136 135 136  K 4.2   < > 4.2 4.3 4.2 4.0 4.5  CL 104  --  104 103  --   --   --    CO2 21*  --  23 22  --   --   --   GLUCOSE 188*  --  212* 218*  --   --   --   BUN 30*  --  27* 27*  --   --   --   CREATININE 1.93*  --  1.73* 1.59*  --   --   --   CALCIUM 7.2*  --  7.3* 7.2*  --   --   --   PHOS 2.8  --  2.4* 2.4*  --   --   --    < > = values in this interval not displayed.   Liver Function Tests: Recent Labs  Lab 11/18/2018 1645  12/05/18 0140  12/06/18 0220 12/06/18 1655 12/07/18 0240  AST 35  --  49*  --   --   --   --   ALT 18  --  21  --   --   --   --   ALKPHOS 183*  --  196*  --   --   --   --     BILITOT 0.4  --  0.6  --   --   --   --   PROT 6.5  --  5.9*  --   --   --   --   ALBUMIN 2.7*   < > 2.1*  2.2*   < > 2.0* 2.0* 2.1*   < > = values in this interval not displayed.   No results for input(s): LIPASE, AMYLASE in the last 168 hours. CBC: Recent Labs  Lab 12/03/18 0249  12/04/18 0245  12/05/18 0140  12/06/18 0220  12/07/18 0240 12/07/18 0515 12/07/18 0706 12/07/18 1156  WBC 6.0  --  6.0  --  10.3  --  12.9*  --  14.5*  --   --   --   NEUTROABS 4.7  --   --   --  8.1*  --   --   --  12.5*  --   --   --   HGB 9.6*   < > 11.2*  11.2*   < > 11.4*   < > 10.8*   < > 11.0* 10.5* 10.5* 11.2*  HCT 29.8*   < > 33.7*  33.8*   < > 35.0*   < > 34.1*   < > 34.8* 31.0* 31.0* 33.0*  MCV 87.4  --  86.2  --  87.1  --  88.8  --  88.8  --   --   --   PLT 144*  --  129*  --  131*  --  102*  --  100*  --   --   --    < > = values in this interval not displayed.   Blood Culture    Component Value Date/Time   SDES BLOOD LEFT HAND 12/03/2018 0245   SPECREQUEST  12/03/2018 0245    BOTTLES DRAWN AEROBIC ONLY Blood Culture adequate volume   CULT  12/03/2018 0245    NO GROWTH 4 DAYS Performed at [facility], [place] [address], [place], [place] [address]    REPTSTATUS PENDING 12/03/2018 0245    Cardiac Enzymes: Recent Labs  Lab 11/28/2018 1645 12/05/18 0140  TROPONINI 0.11* 0.21*   CBG: Recent Labs  Lab 12/06/18 2325 12/07/18 0314  12/07/18 0749 12/07/18 1129 12/07/18 1536  GLUCAP 205* 194* 211* 184* 183*   Iron Studies:  Recent Labs    12/07/18 0240  FERRITIN 51   _0 @ Studies/Results: Dg Chest Port 1 View  Result Date: 12/07/2018 CLINICAL DATA:  Pneumonia EXAM: PORTABLE CHEST 1 VIEW COMPARISON:  12/06/2018 FINDINGS: There is stable positioning of the bilateral central venous catheters, endotracheal tube, and the enteric tube. Diffuse hazy bilateral airspace opacities are again noted with more focal consolidation at the lung bases. There is no pneumothorax. There may be small bilateral pleural effusions. The cardiac silhouette is stable. IMPRESSION: 1. Lines and tubes as above. 2. Persistent multifocal consolidation without significant interval improvement. Electronically Signed   By: Constance Holster M.D.   On: 12/07/2018 14:29   Dg Chest Port 1 View  Result Date: 12/06/2018 CLINICAL DATA:  Pneumonia EXAM: PORTABLE CHEST 1 VIEW COMPARISON:  12/04/2018 FINDINGS: Cardiac shadow is stable. Bilateral jugular catheters, endotracheal tube and gastric catheter are again seen and stable. Patchy multifocal infiltrate is seen although improved aeration in the right upper lobe is noted when compare with the prior study. IMPRESSION: Patchy bilateral infiltrates although significant improved aeration in the right upper lobe is seen. Electronically Signed   By: Linus Mako.D.  On: 12/06/2018 07:19   Medications: .  prismasol BGK 4/2.5 200 mL/hr at 12/05/18 1940  .  prismasol BGK 4/2.5 200 mL/hr at 12/06/18 2300  . feeding supplement (VITAL HIGH PROTEIN) 20 mL/hr at 12/07/18 1500  . fentaNYL infusion INTRAVENOUS 75 mcg/hr (12/07/18 1500)  . heparin 10,000 units/ 20 mL infusion syringe 500 Units/hr (12/07/18 1300)  . midazolam 1 mg/hr (12/07/18 1500)  . norepinephrine (LEVOPHED) Adult infusion 6 mcg/min (12/07/18 1500)  . prismasol BGK 4/2.5 1,500 mL/hr at 12/07/18 1300   . chlorhexidine gluconate (MEDLINE KIT)   15 mL Mouth Rinse BID  . Chlorhexidine Gluconate Cloth  6 each Topical Q0600  . free water  200 mL Per Tube Q8H  . heparin  10,000 Units Subcutaneous Q8H  . insulin aspart  2 Units Subcutaneous Q4H  . insulin aspart  3-9 Units Subcutaneous Q4H  . insulin glargine  10 Units Subcutaneous Daily  . mouth rinse  15 mL Mouth Rinse 10 times per day  . methylPREDNISolone (SOLU-MEDROL) injection  40 mg Intravenous Q24H  . pantoprazole (PROTONIX) IV  40 mg Intravenous Q12H     

## 2018-12-07 NOTE — Progress Notes (Signed)
Nutrition Follow-up RD working remotely.  DOCUMENTATION CODES:   Morbid obesity  INTERVENTION:   Continue via OG tube   Vital High Protein @  60 ml/h (1440 ml per day)   Provides 1440 kcal, 126 gm protein, 1204 ml free water daily  NUTRITION DIAGNOSIS:   Inadequate oral intake related to inability to eat as evidenced by NPO status. Ongoing.   GOAL:   Provide needs based on ASPEN/SCCM guidelines Met.   MONITOR:   Vent status, TF tolerance, Labs, Skin, I & O's  REASON FOR ASSESSMENT:   Ventilator, Consult Enteral/tube feeding initiation and management  ASSESSMENT:   78 yo female with PMH of right BKA, DM, CKD, CHF, HTN, HLD, dementia, aphasia, retinopathy who was admitted with AKI, PNA r/t recent COVID-19 diagnosis, respiratory failure requiring intubation. Transferred to [facility] 5/17.  CRRT continues  Per MD notes, O2 requirements are worse today and pt is too unstable to prone.   Patient is currently intubated on ventilator support Temp (24hrs), Avg:97.1 F (36.2 C), Min:94.3 F (34.6 C), Max:98.3 F (36.8 C) MAP: 55-74  Labs reviewed. BUN 60 (H), creatinine 3.98 (H) CBG's: 211-184  Medications reviewed and include Novolog 2 units every 4 hours, 3-9 units novolog every 4 hours, 10 units lantus daily, Solumedrol. Levophed @ 6 mcg  200 ml every 8 hours = 600 ml   Weight up 4 kg since admission I/O net positive 5.4 L No UOP  Per RN documentation patient has mild generalized edema and mild edema to BUE, LLE, and face.  NUTRITION - FOCUSED PHYSICAL EXAM:  unable to complete  Diet Order:   Diet Order            Diet NPO time specified  Diet effective now              EDUCATION NEEDS:   No education needs have been identified at this time  Skin:  Skin Assessment: Skin Integrity Issues: Skin Integrity Issues:: DTI DTI: lip  Last BM:  5/19 150 ml via rectal tube, none over last 24 hr  Height:   Ht Readings from Last 1 Encounters:  12/03/2018  5' 4"  (1.626 m)    Weight:   Wt Readings from Last 1 Encounters:  12/07/18 113.8 kg    Ideal Body Weight:  50.6 kg (adjusted for BKA)  BMI:  46.5 (adjusted for BKA)  Estimated Nutritional Needs:   Kcal:  1265-1610  Protein:  126 gm  Fluid:  >/= 1.6 L    Molli Barrows, RD, LDN, [place] Pager [phone] After Hours Pager [phone]

## 2018-12-08 DIAGNOSIS — J9601 Acute respiratory failure with hypoxia: Secondary | ICD-10-CM

## 2018-12-08 LAB — GLUCOSE, CAPILLARY
Glucose-Capillary: 196 mg/dL — ABNORMAL HIGH (ref 70–99)
Glucose-Capillary: 199 mg/dL — ABNORMAL HIGH (ref 70–99)
Glucose-Capillary: 196 mg/dL — ABNORMAL HIGH (ref 70–99)
Glucose-Capillary: 202 mg/dL — ABNORMAL HIGH (ref 70–99)
Glucose-Capillary: 145 mg/dL — ABNORMAL HIGH (ref 70–99)
Glucose-Capillary: 214 mg/dL — ABNORMAL HIGH (ref 70–99)

## 2018-12-08 LAB — CULTURE, BLOOD (ROUTINE X 2)
Culture: NO GROWTH
Culture: NO GROWTH
Special Requests: ADEQUATE
Special Requests: ADEQUATE

## 2018-12-08 LAB — RENAL FUNCTION PANEL
Albumin: 2.4 g/dL — ABNORMAL LOW (ref 3.5–5.0)
Albumin: 2.4 g/dL — ABNORMAL LOW (ref 3.5–5.0)
Anion gap: 12 (ref 5–15)
Anion gap: 10 (ref 5–15)
BUN: 27 mg/dL — ABNORMAL HIGH (ref 8–23)
BUN: 29 mg/dL — ABNORMAL HIGH (ref 8–23)
CO2: 20 mmol/L — ABNORMAL LOW (ref 22–32)
CO2: 22 mmol/L (ref 22–32)
Calcium: 7.3 mg/dL — ABNORMAL LOW (ref 8.9–10.3)
Calcium: 7.5 mg/dL — ABNORMAL LOW (ref 8.9–10.3)
Chloride: 104 mmol/L (ref 98–111)
Chloride: 102 mmol/L (ref 98–111)
Creatinine, Ser: 1.71 mg/dL — ABNORMAL HIGH (ref 0.44–1.00)
Creatinine, Ser: 1.59 mg/dL — ABNORMAL HIGH (ref 0.44–1.00)
GFR calc Af Amer: 33 mL/min — ABNORMAL LOW (ref >60)
GFR calc Af Amer: 36 mL/min — ABNORMAL LOW (ref >60)
GFR calc non Af Amer: 29 mL/min — ABNORMAL LOW (ref >60)
GFR calc non Af Amer: 31 mL/min — ABNORMAL LOW (ref >60)
Glucose, Bld: 233 mg/dL — ABNORMAL HIGH (ref 70–99)
Glucose, Bld: 223 mg/dL — ABNORMAL HIGH (ref 70–99)
Phosphorus: 2.8 mg/dL (ref 2.5–4.6)
Phosphorus: 2.3 mg/dL — ABNORMAL LOW (ref 2.5–4.6)
Potassium: 4.8 mmol/L (ref 3.5–5.1)
Potassium: 4.7 mmol/L (ref 3.5–5.1)
Sodium: 136 mmol/L (ref 135–145)
Sodium: 134 mmol/L — ABNORMAL LOW (ref 135–145)

## 2018-12-08 LAB — CBC WITH DIFFERENTIAL/PLATELET
Abs Immature Granulocytes: 1.35 10*3/uL — ABNORMAL HIGH (ref 0.00–0.07)
Basophils Absolute: 0.1 10*3/uL (ref 0.0–0.1)
Basophils Relative: 1 %
Eosinophils Absolute: 0 10*3/uL (ref 0.0–0.5)
Eosinophils Relative: 0 %
HCT: 36.5 % (ref 36.0–46.0)
Hemoglobin: 11.1 g/dL — ABNORMAL LOW (ref 12.0–15.0)
Immature Granulocytes: 5 %
Lymphocytes Relative: 8 %
Lymphs Abs: 2 10*3/uL (ref 0.7–4.0)
MCH: 27.7 pg (ref 26.0–34.0)
MCHC: 30.4 g/dL (ref 30.0–36.0)
MCV: 91 fL (ref 80.0–100.0)
Monocytes Absolute: 0.9 10*3/uL (ref 0.1–1.0)
Monocytes Relative: 4 %
Neutro Abs: 20.4 10*3/uL — ABNORMAL HIGH (ref 1.7–7.7)
Neutrophils Relative %: 82 %
Platelets: 126 10*3/uL — ABNORMAL LOW (ref 150–400)
RBC: 4.01 MIL/uL (ref 3.87–5.11)
RDW: 15.4 % (ref 11.5–15.5)
WBC: 24.8 10*3/uL — ABNORMAL HIGH (ref 4.0–10.5)
nRBC: 3 % — ABNORMAL HIGH (ref 0.0–0.2)

## 2018-12-08 LAB — POCT I-STAT 7, (LYTES, BLD GAS, ICA,H+H)
Acid-base deficit: 5 mmol/L — ABNORMAL HIGH (ref 0.0–2.0)
Bicarbonate: 23.3 mmol/L (ref 20.0–28.0)
Calcium, Ion: 1.06 mmol/L — ABNORMAL LOW (ref 1.15–1.40)
HCT: 34 % — ABNORMAL LOW (ref 36.0–46.0)
Hemoglobin: 11.6 g/dL — ABNORMAL LOW (ref 12.0–15.0)
O2 Saturation: 63 %
Patient temperature: 98.4
Potassium: 4.8 mmol/L (ref 3.5–5.1)
Sodium: 138 mmol/L (ref 135–145)
TCO2: 25 mmol/L (ref 22–32)
pCO2 arterial: 58 mmHg — ABNORMAL HIGH (ref 32.0–48.0)
pH, Arterial: 7.211 — ABNORMAL LOW (ref 7.350–7.450)
pO2, Arterial: 40 mmHg — CL (ref 83.0–108.0)

## 2018-12-08 LAB — APTT: aPTT: 166 seconds (ref 24–36)

## 2018-12-08 LAB — PROCALCITONIN: Procalcitonin: 3.52 ng/mL

## 2018-12-08 NOTE — Progress Notes (Signed)
Updated patients daughter, [name], over the phone. Explained that the patient had increasing oxygen demands, labile blood pressures, worsening blood gas, and was not responsive to stimuli on minimal sedation throughout the night. Updated on changes to ventilator settings and medications patients is currently on. All questions answered. Daughter was appreciative of care and support and thanked the team for the care her mother is receiving.

## 2018-12-08 NOTE — Progress Notes (Signed)
Spoke with [name] Pt daughter to see how she was and asked her if she had any questions in relation to the next steps with her mom. She said no but was very thankful for [place] checking on her.

## 2018-12-08 NOTE — Progress Notes (Signed)
[facility] Progress Note   Subjective:  Seen in room, rough night, O2 desaturation, remains on 2 pressors, I/O even  Objective Vitals:   12/08/18 0700 12/08/18 0738 12/08/18 0800 12/08/18 0900  BP: (!) 106/47 (!) 124/47 (!) 116/50 (!) 127/52  Pulse: 70 70 69 70  Resp: (!) 35 (!) 35 (!) 35 (!) 35  Temp:   97.8 F (36.6 C)   TempSrc:   Axillary   SpO2: 100% 100% 100% 99%  Weight:      Height:       Physical Exam  Sedated and not responding, on vent,  L IJ vascath Chest coarse bilat Cor reg  ABd obese, not examined Ext no LE edema , R BKA Not responsive, sedated, chemically paralyzed and sedated     Home meds:  - aspirin 81/ atorvastatin 10 hs  - insulin lantus 10u hs  - prn's/ vitamins/ supplements/ eyedrops   CXR 5/20 - no chg R> L air space disease   Assessment/Plan: # AKI on CKD: per staff at [facility] 10/2018 Cr =3.5, now with severe AKI/ ATN type picture in setting of sepsis/ shock/ COVID infection. CT without hydro. No UOP. Started CRRT on 5/17 via temp cath - I/O even last 24 hrs, no UOP - FiO2 worse overnight, remains on pressors x 2, unable to pull fluid - IV hep 500u/ hr dc'd yest due to high PTT; PTT higher today, likely from high-dose SQ hep dvt prophylaxis. Not sure AC goal, defer to primary team - entire admit is up 10kg by wts, +5L by I/O - cont CRRT d#6 # Hypoxic respiratory failure/ COVID 19/ possible bacterial PNA: on HCAP antibiotics per primary, dosing vanc for renal function. Was proned yest, now supine - FiO2 100%, unstable from resp standpoint # Anemia: sp prbc's x 2 5/16. Iron sat 12% but hold on IV iron w/ infection. 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  [facility] 12/08/2018, 9:19 AM   Additional Objective Labs: Basic Metabolic Panel: Recent Labs  Lab 12/07/18 0240  12/07/18 1530 12/08/18 0340 12/08/18 0500  NA 135   < > 136 138 136  K 4.3   < > 4.6 4.8 4.8  CL 103  --  103   --  104  CO2 22  --  25  --  20*  GLUCOSE 218*  --  212*  --  233*  BUN 27*  --  26*  --  27*  CREATININE 1.59*  --  1.51*  --  1.71*  CALCIUM 7.2*  --  7.3*  --  7.3*  PHOS 2.4*  --  2.4*  --  2.8   < > = values in this interval not displayed.   Liver Function Tests: Recent Labs  Lab 12/05/18 0140  12/07/18 0240 12/07/18 1530 12/08/18 0500  AST 49*  --   --   --   --   ALT 21  --   --   --   --   ALKPHOS 196*  --   --   --   --   BILITOT 0.6  --   --   --   --   PROT 5.9*  --   --   --   --   ALBUMIN 2.1*  2.2*   < > 2.1* 2.3* 2.4*   < > = values in this interval not displayed.   No results for input(s): LIPASE, AMYLASE in the  last 168 hours. CBC: Recent Labs  Lab 12/04/18 0245  12/05/18 0140  12/06/18 0220  12/07/18 0240  12/07/18 1156 12/08/18 0340 12/08/18 0440  WBC 6.0  --  10.3  --  12.9*  --  14.5*  --   --   --  24.8*  NEUTROABS  --   --  8.1*  --   --   --  12.5*  --   --   --  20.4*  HGB 11.2*  11.2*   < > 11.4*   < > 10.8*   < > 11.0*   < > 11.2* 11.6* 11.1*  HCT 33.7*  33.8*   < > 35.0*   < > 34.1*   < > 34.8*   < > 33.0* 34.0* 36.5  MCV 86.2  --  87.1  --  88.8  --  88.8  --   --   --  91.0  PLT 129*  --  131*  --  102*  --  100*  --   --   --  126*   < > = values in this interval not displayed.   Blood Culture    Component Value Date/Time   SDES  12/07/2018 1505    TRACHEAL ASPIRATE Performed at [facility], [place] [address], [place], [place] [address]    SPECREQUEST  12/07/2018 1505    NONE Performed at [facility], [place] [address] [place], [place] [address]    CULT PENDING 12/07/2018 1505   REPTSTATUS PENDING 12/07/2018 1505    Cardiac Enzymes: Recent Labs  Lab 12/05/18 0140  TROPONINI 0.21*   CBG: Recent Labs  Lab 12/07/18 1536 12/07/18 1943 12/07/18 2339 12/08/18 0325 12/08/18 0748  GLUCAP 183* 200* 191* 196* 199*   Iron Studies:  Recent Labs    12/07/18 0240  FERRITIN 51    _0 @ Studies/Results: Dg Chest Port 1 View  Result Date: 12/07/2018 CLINICAL DATA:  Pneumonia EXAM: PORTABLE CHEST 1 VIEW COMPARISON:  12/06/2018 FINDINGS: There is stable positioning of the bilateral central venous catheters, endotracheal tube, and the enteric tube. Diffuse hazy bilateral airspace opacities are again noted with more focal consolidation at the lung bases. There is no pneumothorax. There may be small bilateral pleural effusions. The cardiac silhouette is stable. IMPRESSION: 1. Lines and tubes as above. 2. Persistent multifocal consolidation without significant interval improvement. Electronically Signed   By: Constance Holster M.D.   On: 12/07/2018 14:29   Medications: .  prismasol BGK 4/2.5 200 mL/hr at 12/07/18 2300  .  prismasol BGK 4/2.5 200 mL/hr at 12/07/18 2300  . feeding supplement (VITAL HIGH PROTEIN) 20 mL/hr at 12/08/18 0700  . fentaNYL infusion INTRAVENOUS 50 mcg/hr (12/08/18 0900)  . midazolam 1 mg/hr (12/08/18 0900)  . norepinephrine (LEVOPHED) Adult infusion 10 mcg/min (12/08/18 0900)  . prismasol BGK 4/2.5 1,500 mL/hr at 12/08/18 0907   . chlorhexidine gluconate (MEDLINE KIT)  15 mL Mouth Rinse BID  . Chlorhexidine Gluconate Cloth  6 each Topical Q0600  . free water  200 mL Per Tube Q8H  . heparin  10,000 Units Subcutaneous Q8H  . insulin aspart  2 Units Subcutaneous Q4H  . insulin aspart  3-9 Units Subcutaneous Q4H  . insulin glargine  10 Units Subcutaneous Daily  . mouth rinse  15 mL Mouth Rinse 10 times per day  . methylPREDNISolone (SOLU-MEDROL) injection  40 mg Intravenous Q24H  . pantoprazole (PROTONIX) IV  40 mg Intravenous Q12H

## 2018-12-08 NOTE — Progress Notes (Signed)
Transient oxygen desaturation.  Improved with increase in FiO2 and PEEP.  Other hemodynamics acceptable.  Goal SpO2 88 to 95%.  Continue on CRRT.  Coralyn Helling, MD [facility] Pulmonary/Critical Care 12/08/2018, 12:22 AM

## 2018-12-08 NOTE — Progress Notes (Signed)
Pt increasingly unstable throughout the night. BP and O2 sats unable to tolerate turns or minimal stimulation with slow recovery and increased requirement of norepinephrine, oxygen and PEEP. Unnecessary stimulation including turns and baths on hold until patient is stabilized.

## 2018-12-08 NOTE — Progress Notes (Signed)
Spoke with Dr. Hyman Hopes from Nephrology. If filter clots off during the night do not restart CRRT.

## 2018-12-08 NOTE — Progress Notes (Signed)
NAME:  Julia Thomas, MRN:  [id number], DOB:  1940-11-11, LOS: 8 ADMISSION DATE:  05-Dec-2018, CONSULTATION DATE:  5/16  REFERRING MD:  Ronaldo Miyamoto, CHIEF COMPLAINT:  Dyspnea   Brief History   78 y/o female with multiple medical problems admitted from a SNF with COVID 19 and dyspnea.    Past Medical History  Dementia CHF HTN  Significant Hospital Events   Acute blood loss anemia, 2u PRBC 5/16 VDRF 5/16  Moved to [facility] 5/17 CVVHD 5/17 Prone position  Consults:  Renal 12/01/2018 PCCM 12/02/2018  Procedures:  ETT 5/16 >>  L IJ HD cath 5/17 >>   Significant Diagnostic Tests:  Chest x-ray-multifocal infiltrate  Micro Data:  Blood cultures 5/14  Antimicrobials:  Cefepime 5/14>> Vancomycin 5/14>>  Actemra 5/17 Steroids 5/17 >>  Paralytics 5/18 >>   Interim history/subjective:  More labile oxygenation.  Probing was deferred on 5/21 due to her instability.  She is continued to have periods of hypoxemia.  Note PO2 in the 40s Progressive hypotension, now back on norepinephrine.  CVVH ongoing but no more volume removal Leukocytosis, started on vancomycin and cefepime since I last saw her   Objective   Blood pressure (!) 128/53, pulse 69, temperature 97.7 F (36.5 C), temperature source Oral, resp. rate (!) 35, height 5\' 4"  (1.626 m), weight 101.4 kg, SpO2 96 %.    Vent Mode: PRVC FiO2 (%):  [80 %-100 %] 80 % Set Rate:  [35 bmp] 35 bmp Vt Set:  [320 mL-380 mL] 380 mL PEEP:  [10 cmH20-16 cmH20] 16 cmH20 Plateau Pressure:  [24 cmH20-36 cmH20] 27 cmH20   Intake/Output Summary (Last 24 hours) at 12/08/2018 1424 Last data filed at 12/08/2018 1400 Gross per 24 hour  Intake 1348.58 ml  Output 1346 ml  Net 2.58 ml   Filed Weights   12/06/18 0447 12/07/18 0545 12/08/18 0449  Weight: 113.9 kg 113.8 kg 101.4 kg    Examination:  General: Ill-appearing woman, ventilated, heavily sedated HENT: ET tube in place, OG tube in place PULM: Coarse bilateral breath sounds CV: Regular,  borderline tachycardic, no murmur GI: Soft, obese, nondistended, positive hypoactive bowel sounds MSK: No deformities Neuro: Does not wake to voice, stimulation.  She does have an inherent drive to breathe   Resolved Hospital Problem list   Dark stools, possible mild GI bleed  Assessment & Plan:  COVID 19 causing ARDS: severe, prognosis poor, oxygenation worse again on 5/21 Continues to require high PEEP, FiO2 with low P to F ratio, PO2 in the 40s Following chest x-ray Low threshold to reinitiate paralytics although her sedation appears to be adequate without any vent dyssynchrony She remains 5 L positive but this is stable.  Not clear that she can tolerate any volume removal with CVVH hemodynamically Continue steroids as ordered Empiric vanco and cefepime restarted - continue same for now.  Will need to revisit GOC. Recovery from this illness looking less likely.   Shock, hypovolemic and septic Continue norepinephrine as ordered  Acute renal failure On CVVHD.  Unable to tolerate volume removal at this time  Dementia, need for sedation   Heavy sedation, RASS -3 to -4  Nutrition Tube feeding as ordered    Best practice:  Diet: Tube feeding Pain/Anxiety/Delirium protocol (if indicated): RASS score -3 to -4 VAP protocol (if indicated): Yes DVT prophylaxis: Sub cutaneous heparin GI prophylaxis: Pantoprazole Glucose control: Sliding scale insulin, monitor glucose Mobility: Bedrest Code Status: limited code blue, see PCCM note from 5/19  Family Communication: I spoke  with her daughter [name], also called her daughter Ander Slade but was unable to reach her. I explained that pt is progressively worse despite maximal support, that prognosis for survival has become progressively poor. I have recommended that we move to a transition to comfort care. [name] understands and agrees. She would like to arrange to visit, also notes that Ander Slade may want to come down from [place] to see the pt. They also  have a brother who may visit. I've recommended that we attempt to organize in the next 1-2 days.    Labs   CBC: Recent Labs  Lab 12/02/18 0526  12/03/18 0249  12/04/18 0245  12/05/18 0140  12/06/18 0220  12/07/18 0240 12/07/18 0515 12/07/18 0706 12/07/18 1156 12/08/18 0340 12/08/18 0440  WBC 7.8  --  6.0  --  6.0  --  10.3  --  12.9*  --  14.5*  --   --   --   --  24.8*  NEUTROABS 6.1  --  4.7  --   --   --  8.1*  --   --   --  12.5*  --   --   --   --  20.4*  HGB 7.7*   < > 9.6*   < > 11.2*  11.2*   < > 11.4*   < > 10.8*   < > 11.0* 10.5* 10.5* 11.2* 11.6* 11.1*  HCT 25.6*   < > 29.8*   < > 33.7*  33.8*   < > 35.0*   < > 34.1*   < > 34.8* 31.0* 31.0* 33.0* 34.0* 36.5  MCV 93.4  --  87.4  --  86.2  --  87.1  --  88.8  --  88.8  --   --   --   --  91.0  PLT 159  --  144*  --  129*  --  131*  --  102*  --  100*  --   --   --   --  126*   < > = values in this interval not displayed.    Basic Metabolic Panel: Recent Labs  Lab 12/02/18 0526  12/03/18 0249  12/04/18 0245  12/06/18 0220  12/06/18 1655 12/07/18 0240  12/07/18 0706 12/07/18 1156 12/07/18 1530 12/08/18 0340 12/08/18 0500  NA 150*   < > 145   < > 140   < > 137   < > 136 135   < > 135 136 136 138 136  K 5.5*   < > 4.9   < > 4.1   < > 4.2   < > 4.2 4.3   < > 4.0 4.5 4.6 4.8 4.8  CL 124*  --  118*  --  109   < > 104  --  104 103  --   --   --  103  --  104  CO2 14*  --  15*  --  20*   < > 21*  --  23 22  --   --   --  25  --  20*  GLUCOSE 127*  --  250*  --  264*   < > 188*  --  212* 218*  --   --   --  212*  --  233*  BUN 81*  --  82*  --  60*   < > 30*  --  27* 27*  --   --   --  26*  --  27*  CREATININE 6.55*  --  6.62*  --  3.98*   < > 1.93*  --  1.73* 1.59*  --   --   --  1.51*  --  1.71*  CALCIUM 8.1*  --  7.4*  --  7.3*   < > 7.2*  --  7.3* 7.2*  --   --   --  7.3*  --  7.3*  MG 2.2  --  2.2  --  2.3  --   --   --   --   --   --   --   --   --   --   --   PHOS 5.5*  --  5.3*  --  3.4   < > 2.8  --  2.4*  2.4*  --   --   --  2.4*  --  2.8   < > = values in this interval not displayed.   GFR: Estimated Creatinine Clearance: 32.4 mL/min (A) (by C-G formula based on SCr of 1.71 mg/dL (H)). Recent Labs  Lab 12/03/18 0249  12/05/18 0140 12/06/18 0220 12/07/18 0240 12/08/18 0440  PROCALCITON  --   --  13.40 11.32 5.80 3.52  WBC 6.0   < > 10.3 12.9* 14.5* 24.8*  LATICACIDVEN 0.9  --   --   --   --   --    < > = values in this interval not displayed.    Liver Function Tests: Recent Labs  Lab 12/05/18 0140  12/06/18 0220 12/06/18 1655 12/07/18 0240 12/07/18 1530 12/08/18 0500  AST 49*  --   --   --   --   --   --   ALT 21  --   --   --   --   --   --   ALKPHOS 196*  --   --   --   --   --   --   BILITOT 0.6  --   --   --   --   --   --   PROT 5.9*  --   --   --   --   --   --   ALBUMIN 2.1*  2.2*   < > 2.0* 2.0* 2.1* 2.3* 2.4*   < > = values in this interval not displayed.   No results for input(s): LIPASE, AMYLASE in the last 168 hours. No results for input(s): AMMONIA in the last 168 hours.  ABG    Component Value Date/Time   PHART 7.211 (L) 12/08/2018 0340   PCO2ART 58.0 (H) 12/08/2018 0340   PO2ART 40.0 (LL) 12/08/2018 0340   HCO3 23.3 12/08/2018 0340   TCO2 25 12/08/2018 0340   ACIDBASEDEF 5.0 (H) 12/08/2018 0340   O2SAT 63.0 12/08/2018 0340     Coagulation Profile: Recent Labs  Lab 12/03/18 0945  INR 1.1    Cardiac Enzymes: Recent Labs  Lab 12/05/18 0140  TROPONINI 0.21*    HbA1C: Hgb A1c MFr Bld  Date/Time Value Ref Range Status  12/07/2018 02:40 AM 6.6 (H) 4.8 - 5.6 % Final    Comment:    (NOTE) Pre diabetes:          5.7%-6.4% Diabetes:              >6.4% Glycemic control for   <7.0% adults with diabetes     CBG: Recent Labs  Lab 12/07/18 1943 12/07/18 2339 12/08/18 0325 12/08/18 0748 12/08/18 1129  GLUCAP 200* 191* 196*  199* 196*     Critical care time: 34 minutes     Levy Pupaobert , MD, PhD 12/08/2018, 3:12 PM [place]  [facility] [phone] or if no answer [phone]

## 2018-12-08 NOTE — Significant Event (Signed)
PCCM Interval Note  I was able to reach her daughter in [place]. Updated her about pt's status and prognosis. Joy understands and is agreement with [name] about transition to comfort care.   Levy Pupa, MD, PhD 12/08/2018, 3:25 PM [facility] [phone] or if no answer [phone]

## 2018-12-08 NOTE — Progress Notes (Signed)
PROGRESS NOTE  Julia Thomas GNF:[id number] DOB: 21-Aug-1940 DOA: 12/11/2018  PCP: Leanna Battles, MD  Brief History/Interval Summary: 78 year old African-American female, skilled nursing facility resident, with past medical history significant for recently positive test for COVID 19, chronic kidney disease with unknown baseline, diabetes mellitus, hypertension, CHF, dementia amongst other past medical and cardiac history.  At the skilled nursing facility patient was found to have poor oral intake.  Blood work revealed acute renal failure.  Patient was transferred to the hospital.  Patient became progressively hypoxic.  Transferred to the intensive care unit.  Subsequently intubated.  Also developed acute renal failure requiring CRRT.  Reason for Visit: Acute respiratory failure with hypoxia secondary to COVID-19  Consultants: Pulmonology.  Nephrology.  Procedures:  Intubated Dialysis catheter placement 5/17 On CRRT  Antibiotics: Anti-infectives (From admission, onward)   Start     Dose/Rate Route Frequency Ordered Stop   12/03/18 2200  vancomycin (VANCOCIN) IVPB 1000 mg/200 mL premix     1,000 mg 200 mL/hr over 60 Minutes Intravenous Every 24 hours 12/03/18 1115 12/06/18 2343   12/03/18 1600  ceFEPIme (MAXIPIME) 2 g in sodium chloride 0.9 % 100 mL IVPB     2 g 200 mL/hr over 30 Minutes Intravenous Every 12 hours 12/03/18 1115 12/07/18 1716   12/02/18 2000  vancomycin (VANCOCIN) IVPB 1000 mg/200 mL premix     1,000 mg 200 mL/hr over 60 Minutes Intravenous  Once 12/02/18 1043 12/02/18 2130   12/01/18 2200  ceFEPIme (MAXIPIME) 1 g in sodium chloride 0.9 % 100 mL IVPB  Status:  Discontinued     1 g 200 mL/hr over 30 Minutes Intravenous Every 24 hours 12/01/18 1227 12/03/18 1115   12/01/18 1800  ceFEPIme (MAXIPIME) 1 g in sodium chloride 0.9 % 100 mL IVPB  Status:  Discontinued     1 g 200 mL/hr over 30 Minutes Intravenous Every 24 hours 12/14/2018 1831 12/08/2018 2040   12/01/18 0754   vancomycin variable dose per unstable renal function (pharmacist dosing)  Status:  Discontinued      Does not apply See admin instructions 12/01/18 0755 12/03/18 1115   12/09/2018 1830  vancomycin variable dose per unstable renal function (pharmacist dosing)  Status:  Discontinued      Does not apply See admin instructions 11/26/2018 1831 11/23/2018 2040   11/25/2018 1700  vancomycin (VANCOCIN) 1,750 mg in sodium chloride 0.9 % 500 mL IVPB     1,750 mg 250 mL/hr over 120 Minutes Intravenous  Once 12/09/2018 1646 12/01/2018 2102   11/18/2018 1700  ceFEPIme (MAXIPIME) 2 g in sodium chloride 0.9 % 100 mL IVPB     2 g 200 mL/hr over 30 Minutes Intravenous  Once 12/09/2018 1646 11/17/2018 1754       Subjective/Interval History: Patient remains intubated and sedated.  Assessment/Plan:  Acute Hypoxic Resp. Failure due to Acute Covid 19 Viral Illness  Vent Mode: PRVC FiO2 (%):  [80 %-100 %] 80 % Set Rate:  [35 bmp] 35 bmp Vt Set:  [320 mL-380 mL] 380 mL PEEP:  [10 cmH20-16 cmH20] 16 cmH20 Plateau Pressure:  [24 cmH20-36 cmH20] 27 cmH20     Component Value Date/Time   PHART 7.211 (L) 12/08/2018 0340   PCO2ART 58.0 (H) 12/08/2018 0340   PO2ART 40.0 (LL) 12/08/2018 0340   HCO3 23.3 12/08/2018 0340   TCO2 25 12/08/2018 0340   ACIDBASEDEF 5.0 (H) 12/08/2018 0340   O2SAT 63.0 12/08/2018 0340    COVID-19 Labs  Recent Labs    12/07/18  0240  DDIMER 1.18*  FERRITIN 51  CRP 6.1*     Fever: She was last febrile on 5/15-5/16.  She has been noted to be hypothermic.  Temperature curve has improved in the last 48 hours. Oxygen requirements: On mechanical ventilation.  Still on 8200% FiO2.  Saturating in the 90s.  PaO2 remains extremely low.   Antibiotics: Completed 5-day course of vancomycin and cefepime yesterday Steroids: Remains on Solu-Medrol.   Diuretics: Not on scheduled diuretics.  Volume being managed by CRRT Actemra: Has received 2 doses, 5/17 and 5/18 Convalescent Plasma: Not yet  Patient  remains on mechanical ventilation.  Pulmonology is following up.  Patient has not shown any improvement in her respiratory status.  Her PaO2 remains extremely low.  She has been tried in prone position previously.  She has completed a 5-day course of antibiotics.  Initially procalcitonin was significantly elevated 11.32.  Improved to 5.82 today.  WBC remains elevated.  MRSA PCR was positive.  Blood cultures have been negative.  Tracheal aspirate was sent for cultures yesterday.  In view of rising WBC we would have liked her back on antibiotics however informed by nursing staff that family members were considering de-escalating care.  This would be reasonable as the patient has not made any progress despite maximal treatment for over a week now.  PCCM to discuss with them today.  CRP had improved to 6.1.  D-dimer was 1.18.   The treatment plan and use of medications and known side effects were discussed with patient/family. It was clearly explained that there is no proven definitive treatment for COVID-19 infection yet. Any medications used here are based on case reports/anecdotal data which are not peer-reviewed and has not been studied using randomized control trials.  Complete risks and long-term side effects are unknown, however in the best clinical judgment they seem to be of some clinical benefit rather than medical risks.  Patient/family agree with the treatment plan and want to receive these treatments as indicated.  Patient was subsequently given Actemra.  Hypothermia Most likely due to acute illness/sepsis/CRRT.  TSH noted to be low at 0.156.  Could be sick euthyroid.  Free T4 is 1.05.  Cortisol 11.4 although patient has been on Solu-Medrol blood cultures negative so far.  Temperature curve has improved.  Acute kidney injury on chronic kidney disease with normal anion gap metabolic acidosis Pains on CRRT.  Nephrology is following and managing.    Diabetes mellitus type II with  hyperglycemia Hyperglycemia secondary to steroids.  Continue Lantus and SSI.  CBG is reasonably well controlled.  HbA1c 6.6.    Septic shock Remains on vasopressors.    Questionable acute blood loss anemia During the earlier part of this hospitalization her hemoglobin dropped to 5.4. Patient was noted to have dark stools.  Unclear if patient truly had GI bleed.  Patient was transfused.  Globin has been stable for the last several days.    Concern for GI bleeding Apparently had dark stool.  Had a drop in hemoglobin requiring blood transfusion.  Hemoglobin has been stable since transfusion.  No further episodes of GI bleeding.  Continue PPI.    Thrombocytopenia Platelet count remains low but stable.  History of dementia Supportive measures.  FEN On CRRT.  Not on any IV fluids.  Monitor electrolytes.  Continue tube feedings.  On free water.     DVT Prophylaxis: Subcutaneous heparin 10,000 units every 8 hours  Lab Results  Component Value Date   PLT 126 (L)  12/08/2018     PUD Prophylaxis: PPI every 12 hours Code Status: Partial code Family Communication: PCCM to discuss with family Disposition Plan: Unclear.  Continue ICU care.   Medications:  Scheduled: . chlorhexidine gluconate (MEDLINE KIT)  15 mL Mouth Rinse BID  . Chlorhexidine Gluconate Cloth  6 each Topical Q0600  . free water  200 mL Per Tube Q8H  . heparin  10,000 Units Subcutaneous Q8H  . insulin aspart  2 Units Subcutaneous Q4H  . insulin aspart  3-9 Units Subcutaneous Q4H  . insulin glargine  10 Units Subcutaneous Daily  . mouth rinse  15 mL Mouth Rinse 10 times per day  . methylPREDNISolone (SOLU-MEDROL) injection  40 mg Intravenous Q24H  . pantoprazole (PROTONIX) IV  40 mg Intravenous Q12H   Continuous: .  prismasol BGK 4/2.5 200 mL/hr at 12/07/18 2300  .  prismasol BGK 4/2.5 200 mL/hr at 12/07/18 2300  . feeding supplement (VITAL HIGH PROTEIN) 20 mL/hr at 12/08/18 0700  . fentaNYL infusion INTRAVENOUS  50 mcg/hr (12/08/18 1200)  . midazolam 1 mg/hr (12/08/18 1200)  . norepinephrine (LEVOPHED) Adult infusion 9.5 mcg/min (12/08/18 1200)  . prismasol BGK 4/2.5 1,500 mL/hr at 12/08/18 1221   WCB:JSEGBTDV, heparin, hydrALAZINE, midazolam   Objective:  Vital Signs  Vitals:   12/08/18 1000 12/08/18 1100 12/08/18 1200 12/08/18 1203  BP: (!) 153/64 (!) 149/59 (!) 158/66 (!) 179/62  Pulse: 71 72 74 73  Resp: (!) 35 (!) 35 (!) 35 (!) 35  Temp:   97.7 F (36.5 C)   TempSrc:   Oral   SpO2: 100% 100% 100% 100%  Weight:      Height:        Intake/Output Summary (Last 24 hours) at 12/08/2018 1234 Last data filed at 12/08/2018 1200 Gross per 24 hour  Intake 1453.94 ml  Output 1390 ml  Net 63.94 ml   Filed Weights   12/06/18 0447 12/07/18 0545 12/08/18 0449  Weight: 113.9 kg 113.8 kg 101.4 kg   General appearance: Intubated and sedated Resp: Coarse breath sounds bilaterally.  Few crackles at the bases.  No wheezing or rhonchi.   Cardio: S1-S2 is normal regular.  No S3-S4.  No rubs murmurs or bruit GI: Abdomen is soft.  Nontender nondistended.  Bowel sounds are present normal.  No masses organomegaly Extremities: Minimal edema bilateral lower extremities. Neurologic: Sedated.   Lab Results:  Data Reviewed: I have personally reviewed following labs and imaging studies  CBC: Recent Labs  Lab 12/02/18 0526  12/03/18 0249  12/04/18 0245  12/05/18 0140  12/06/18 0220  12/07/18 0240 12/07/18 0515 12/07/18 0706 12/07/18 1156 12/08/18 0340 12/08/18 0440  WBC 7.8  --  6.0  --  6.0  --  10.3  --  12.9*  --  14.5*  --   --   --   --  24.8*  NEUTROABS 6.1  --  4.7  --   --   --  8.1*  --   --   --  12.5*  --   --   --   --  20.4*  HGB 7.7*   < > 9.6*   < > 11.2*  11.2*   < > 11.4*   < > 10.8*   < > 11.0* 10.5* 10.5* 11.2* 11.6* 11.1*  HCT 25.6*   < > 29.8*   < > 33.7*  33.8*   < > 35.0*   < > 34.1*   < > 34.8* 31.0* 31.0* 33.0* 34.0*  36.5  MCV 93.4  --  87.4  --  86.2  --  87.1   --  88.8  --  88.8  --   --   --   --  91.0  PLT 159  --  144*  --  129*  --  131*  --  102*  --  100*  --   --   --   --  126*   < > = values in this interval not displayed.    Basic Metabolic Panel: Recent Labs  Lab 12/02/18 0526  12/03/18 0249  12/04/18 0245  12/06/18 0220  12/06/18 1655 12/07/18 0240  12/07/18 0706 12/07/18 1156 12/07/18 1530 12/08/18 0340 12/08/18 0500  NA 150*   < > 145   < > 140   < > 137   < > 136 135   < > 135 136 136 138 136  K 5.5*   < > 4.9   < > 4.1   < > 4.2   < > 4.2 4.3   < > 4.0 4.5 4.6 4.8 4.8  CL 124*  --  118*  --  109   < > 104  --  104 103  --   --   --  103  --  104  CO2 14*  --  15*  --  20*   < > 21*  --  23 22  --   --   --  25  --  20*  GLUCOSE 127*  --  250*  --  264*   < > 188*  --  212* 218*  --   --   --  212*  --  233*  BUN 81*  --  82*  --  60*   < > 30*  --  27* 27*  --   --   --  26*  --  27*  CREATININE 6.55*  --  6.62*  --  3.98*   < > 1.93*  --  1.73* 1.59*  --   --   --  1.51*  --  1.71*  CALCIUM 8.1*  --  7.4*  --  7.3*   < > 7.2*  --  7.3* 7.2*  --   --   --  7.3*  --  7.3*  MG 2.2  --  2.2  --  2.3  --   --   --   --   --   --   --   --   --   --   --   PHOS 5.5*  --  5.3*  --  3.4   < > 2.8  --  2.4* 2.4*  --   --   --  2.4*  --  2.8   < > = values in this interval not displayed.    GFR: Estimated Creatinine Clearance: 32.4 mL/min (A) (by C-G formula based on SCr of 1.71 mg/dL (H)).  Liver Function Tests: Recent Labs  Lab 12/05/18 0140  12/06/18 0220 12/06/18 1655 12/07/18 0240 12/07/18 1530 12/08/18 0500  AST 49*  --   --   --   --   --   --   ALT 21  --   --   --   --   --   --   ALKPHOS 196*  --   --   --   --   --   --   BILITOT 0.6  --   --   --   --   --   --  PROT 5.9*  --   --   --   --   --   --   ALBUMIN 2.1*  2.2*   < > 2.0* 2.0* 2.1* 2.3* 2.4*   < > = values in this interval not displayed.     Coagulation Profile: Recent Labs  Lab 12/03/18 0945  INR 1.1    Cardiac Enzymes: Recent  Labs  Lab 12/05/18 0140  TROPONINI 0.21*     CBG: Recent Labs  Lab 12/07/18 1943 12/07/18 2339 12/08/18 0325 12/08/18 0748 12/08/18 1129  GLUCAP 200* 191* 196* 199* 196*    Anemia Panel: Recent Labs    12/07/18 0240  FERRITIN 51    Recent Results (from the past 240 hour(s))  Blood Culture (routine x 2)     Status: None   Collection Time: 12/17/2018  4:45 PM  Result Value Ref Range Status   Specimen Description BLOOD RIGHT HAND  Final   Special Requests   Final    BOTTLES DRAWN AEROBIC AND ANAEROBIC Blood Culture adequate volume   Culture   Final    NO GROWTH 5 DAYS Performed at [facility], [place] [address], [place], [place] [address]    Report Status 12/05/2018 FINAL  Final  Blood Culture (routine x 2)     Status: None   Collection Time: 11/24/2018  5:10 PM  Result Value Ref Range Status   Specimen Description BLOOD LEFT HAND  Final   Special Requests   Final    BOTTLES DRAWN AEROBIC ONLY Blood Culture results may not be optimal due to an inadequate volume of blood received in culture bottles   Culture   Final    NO GROWTH 5 DAYS Performed at [facility], [place] [address], [place], [place] [address]    Report Status 12/05/2018 FINAL  Final  Urine culture     Status: Abnormal   Collection Time: 12/15/2018  5:28 PM  Result Value Ref Range Status   Specimen Description URINE, CATHETERIZED  Final   Special Requests NONE  Final   Culture (A)  Final    <10,000 COLONIES/mL INSIGNIFICANT GROWTH Performed at [facility], [place] [address] [place], [place] [address]    Report Status 12/01/2018 FINAL  Final  MRSA PCR Screening     Status: Abnormal   Collection Time: 12/02/18 11:21 PM  Result Value Ref Range Status   MRSA by PCR POSITIVE (A) NEGATIVE Final    Comment:        The GeneXpert MRSA Assay (FDA approved for NASAL specimens only), is one component of a comprehensive MRSA colonization surveillance program. It is not intended to diagnose  MRSA infection nor to guide or monitor treatment for MRSA infections. RESULT CALLED TO, READ BACK BY AND VERIFIED WITH: Crista Elliot RN @ [phone] ON 12/03/18 BY ROBINSON Z.  Performed at [facility], [place] [address] [place], [place] [address]   Culture, blood (routine x 2)     Status: None   Collection Time: 12/03/18  2:30 AM  Result Value Ref Range Status   Specimen Description BLOOD LEFT ANTECUBITAL  Final   Special Requests   Final    BOTTLES DRAWN AEROBIC ONLY Blood Culture adequate volume   Culture   Final    NO GROWTH 5 DAYS Performed at [facility], [place] [address], [place], [place] [address]    Report Status 12/08/2018 FINAL  Final  Culture, blood (routine x 2)  Status: None   Collection Time: 12/03/18  2:45 AM  Result Value Ref Range Status   Specimen Description BLOOD LEFT HAND  Final   Special Requests   Final    BOTTLES DRAWN AEROBIC ONLY Blood Culture adequate volume   Culture   Final    NO GROWTH 5 DAYS Performed at [facility], [address], [place], [place] [address]    Report Status 12/08/2018 FINAL  Final  Culture, respiratory (non-expectorated)     Status: None (Preliminary result)   Collection Time: 12/07/18  3:05 PM  Result Value Ref Range Status   Specimen Description   Final    TRACHEAL ASPIRATE Performed at [place] [address] [place], [place] [address]    Special Requests   Final    NONE Performed at [facility], [place] [address] [place], [place] [address]    Gram Stain   Final    ABUNDANT WBC PRESENT,BOTH PMN AND MONONUCLEAR RARE GRAM POSITIVE COCCI RARE GRAM VARIABLE ROD    Culture   Final    CULTURE REINCUBATED FOR BETTER GROWTH Performed at [facility], [place] [address], [place], [place] [address]    Report Status PENDING  Incomplete      Radiology Studies: Dg Chest Port 1 View  Result Date: 12/07/2018 CLINICAL DATA:  Pneumonia EXAM: PORTABLE CHEST 1 VIEW  COMPARISON:  12/06/2018 FINDINGS: There is stable positioning of the bilateral central venous catheters, endotracheal tube, and the enteric tube. Diffuse hazy bilateral airspace opacities are again noted with more focal consolidation at the lung bases. There is no pneumothorax. There may be small bilateral pleural effusions. The cardiac silhouette is stable. IMPRESSION: 1. Lines and tubes as above. 2. Persistent multifocal consolidation without significant interval improvement. Electronically Signed   By: Constance Holster M.D.   On: 12/07/2018 14:29       LOS: 8 days   [facility] Pager on [email or web address]  12/08/2018, 12:34 PM

## 2018-12-08 NOTE — Progress Notes (Signed)
Spoke with daughter in relation of the next steps with her mothers care. I informed her when they have agreed on a time to come to the hospital to let us know, so we can start arranging time for  them to spend with her. I told her of the steps and that the others can face time if they want to during this time. She understands and is aware that only 2 people can come in and they can have 15 minutes with her. She is also aware that once her mom is extubated that they can face time in and spend time with her during this period via the phone, if they so wish to do.

## 2018-12-09 LAB — RENAL FUNCTION PANEL
Albumin: 2.4 g/dL — ABNORMAL LOW (ref 3.5–5.0)
Anion gap: 8 (ref 5–15)
BUN: 28 mg/dL — ABNORMAL HIGH (ref 8–23)
CO2: 26 mmol/L (ref 22–32)
Calcium: 7.5 mg/dL — ABNORMAL LOW (ref 8.9–10.3)
Chloride: 103 mmol/L (ref 98–111)
Creatinine, Ser: 1.56 mg/dL — ABNORMAL HIGH (ref 0.44–1.00)
GFR calc Af Amer: 37 mL/min — ABNORMAL LOW (ref >60)
GFR calc non Af Amer: 32 mL/min — ABNORMAL LOW (ref >60)
Glucose, Bld: 217 mg/dL — ABNORMAL HIGH (ref 70–99)
Phosphorus: 2.1 mg/dL — ABNORMAL LOW (ref 2.5–4.6)
Potassium: 4.4 mmol/L (ref 3.5–5.1)
Sodium: 137 mmol/L (ref 135–145)

## 2018-12-09 LAB — GLUCOSE, CAPILLARY
Glucose-Capillary: 151 mg/dL — ABNORMAL HIGH (ref 70–99)
Glucose-Capillary: 182 mg/dL — ABNORMAL HIGH (ref 70–99)
Glucose-Capillary: 189 mg/dL — ABNORMAL HIGH (ref 70–99)
Glucose-Capillary: 251 mg/dL — ABNORMAL HIGH (ref 70–99)
Glucose-Capillary: 274 mg/dL — ABNORMAL HIGH (ref 70–99)
Glucose-Capillary: 282 mg/dL — ABNORMAL HIGH (ref 70–99)

## 2018-12-09 LAB — APTT: aPTT: 133 seconds — ABNORMAL HIGH (ref 24–36)

## 2018-12-09 MED ORDER — METHYLPREDNISOLONE SODIUM SUCC 40 MG IJ SOLR
40.0000 mg | INTRAMUSCULAR | Status: DC
  Administered 2018-12-09 – 2018-12-10 (×2): 40 mg via INTRAVENOUS
  Filled 2018-12-09 (×2): qty 1

## 2018-12-09 MED ORDER — HEPARIN SODIUM (PORCINE) 10000 UNIT/ML IJ SOLN
10000.0000 [IU] | Freq: Three times a day (TID) | INTRAMUSCULAR | Status: DC
  Administered 2018-12-10 (×2): 10000 [IU] via SUBCUTANEOUS
  Filled 2018-12-09 (×2): qty 1

## 2018-12-09 NOTE — Progress Notes (Signed)
NAME:  Julia Thomas, MRN:  [id number], DOB:  Aug 01, 1940, LOS: 9 ADMISSION DATE:  12/02/2018, CONSULTATION DATE:  5/16  REFERRING MD:  Ronaldo Miyamoto, CHIEF COMPLAINT:  Dyspnea   Brief History   78 y/o female with multiple medical problems admitted from a SNF with COVID 19 and dyspnea.    Past Medical History  Dementia CHF HTN  Significant Hospital Events   Acute blood loss anemia, 2u PRBC 5/16 VDRF 5/16  Moved to [facility] 5/17 CVVHD 5/17 Prone position  Consults:  Renal 12/01/2018 PCCM 12/02/2018  Procedures:  ETT 5/16 >>  L IJ HD cath 5/17 >>   Significant Diagnostic Tests:  Chest x-ray-multifocal infiltrate  Micro Data:  Blood cultures 5/14  Antimicrobials:  Cefepime 5/14>> Vancomycin 5/14>>  Actemra 5/17 Steroids 5/17 >>  Paralytics 5/18 >>   Interim history/subjective:  No significant clinical change, high PEEP and FiO2 needs Remains on pressors CVVH running   Objective   Blood pressure (!) 107/54, pulse 100, temperature 98.2 F (36.8 C), temperature source Oral, resp. rate 20, height  (1.626 m), weight 101.4 kg, SpO2 96 %.    Vent Mode: PRVC FiO2 (%):  [60 %-80 %] 60 % Set Rate:  [35 bmp] 35 bmp Vt Set:  [380 mL] 380 mL PEEP:  [16 cmH20] 16 cmH20 Plateau Pressure:  [29 cmH20-33 cmH20] 30 cmH20   Intake/Output Summary (Last 24 hours) at 12/09/2018 1402 Last data filed at 12/09/2018 1200 Gross per 24 hour  Intake 961.39 ml  Output 866 ml  Net 95.39 ml   Filed Weights   12/06/18 0447 12/07/18 0545 12/08/18 0449  Weight: 113.9 kg 113.8 kg 101.4 kg    Examination: Exam is unchanged as below:  General: Ill-appearing woman, ventilated, heavily sedated HENT: ET tube in place, OG tube in place PULM: Coarse bilateral breath sounds CV: Regular, borderline tachycardic, no murmur GI: Soft, obese, nondistended, positive hypoactive bowel sounds MSK: No deformities Neuro: Does not wake to voice, stimulation.  She does have an inherent drive to breathe    Resolved Hospital Problem list   Dark stools, possible mild GI bleed  Assessment & Plan:  COVID 19 causing ARDS: severe, prognosis poor, oxygenation worse again on 5/21 Continue current ventilator support, high PEEP, high FiO2 Defer paralytics at this time, continues to require heavy sedation Discussing withdrawal of care with family given failure to stabilize / improve On empiric abx, steroids  Shock, hypovolemic and septic norepi as ordered  Acute renal failure Discussed with nephrology.  Plan to discontinue CVVH 5/23 in anticipation of withdrawal of care  Dementia, need for sedation   Heavy sedation, RASS -3 to -4  Nutrition Tube feeds running    Best practice:  Diet: Tube feeding Pain/Anxiety/Delirium protocol (if indicated): RASS score -3 to -4 VAP protocol (if indicated): Yes DVT prophylaxis: Sub cutaneous heparin GI prophylaxis: Pantoprazole Glucose control: Sliding scale insulin, monitor glucose Mobility: Bedrest Code Status: limited code blue, see PCCM note from 5/19  Family Communication:  5/22 >> I spoke with her daughter [name], also called her daughter Ander Slade but was unable to reach her. I explained that pt is progressively worse despite maximal support, that prognosis for survival has become progressively poor. I have recommended that we move to a transition to comfort care. [name] understands and agrees. She would like to arrange to visit, also notes that Ander Slade may want to come down from [place] to see the pt. They also have a brother who may visit. I've recommended that we  attempt to organize in the next 1-2 days.   5/23 >> I spoke with Thelma BargeFrancis again today.  She is contemplating the timing on a withdrawal of care, notes that her brother is still having some difficulty excepting that the patient is not going to improve.  All the same it sounds like they are planning for withdrawal either today or tomorrow.  I will continue to communicate with her   Labs   CBC: Recent  Labs  Lab 12/03/18 0249  12/04/18 0245  12/05/18 0140  12/06/18 0220  12/07/18 0240 12/07/18 0515 12/07/18 0706 12/07/18 1156 12/08/18 0340 12/08/18 0440  WBC 6.0  --  6.0  --  10.3  --  12.9*  --  14.5*  --   --   --   --  24.8*  NEUTROABS 4.7  --   --   --  8.1*  --   --   --  12.5*  --   --   --   --  20.4*  HGB 9.6*   < > 11.2*  11.2*   < > 11.4*   < > 10.8*   < > 11.0* 10.5* 10.5* 11.2* 11.6* 11.1*  HCT 29.8*   < > 33.7*  33.8*   < > 35.0*   < > 34.1*   < > 34.8* 31.0* 31.0* 33.0* 34.0* 36.5  MCV 87.4  --  86.2  --  87.1  --  88.8  --  88.8  --   --   --   --  91.0  PLT 144*  --  129*  --  131*  --  102*  --  100*  --   --   --   --  126*   < > = values in this interval not displayed.    Basic Metabolic Panel: Recent Labs  Lab 12/03/18 0249  12/04/18 0245  12/07/18 0240  12/07/18 1530 12/08/18 0340 12/08/18 0500 12/08/18 1532 12/09/18 0200  NA 145   < > 140   < > 135   < > 136 138 136 134* 137  K 4.9   < > 4.1   < > 4.3   < > 4.6 4.8 4.8 4.7 4.4  CL 118*  --  109   < > 103  --  103  --  104 102 103  CO2 15*  --  20*   < > 22  --  25  --  20* 22 26  GLUCOSE 250*  --  264*   < > 218*  --  212*  --  233* 223* 217*  BUN 82*  --  60*   < > 27*  --  26*  --  27* 29* 28*  CREATININE 6.62*  --  3.98*   < > 1.59*  --  1.51*  --  1.71* 1.59* 1.56*  CALCIUM 7.4*  --  7.3*   < > 7.2*  --  7.3*  --  7.3* 7.5* 7.5*  MG 2.2  --  2.3  --   --   --   --   --   --   --   --   PHOS 5.3*  --  3.4   < > 2.4*  --  2.4*  --  2.8 2.3* 2.1*   < > = values in this interval not displayed.   GFR: Estimated Creatinine Clearance: 35.5 mL/min (A) (by C-G formula based on SCr of 1.56 mg/dL (H)). Recent Labs  Lab 12/03/18 0249  12/05/18 0140 12/06/18 0220 12/07/18 0240 12/08/18 0440  PROCALCITON  --   --  13.40 11.32 5.80 3.52  WBC 6.0   < > 10.3 12.9* 14.5* 24.8*  LATICACIDVEN 0.9  --   --   --   --   --    < > = values in this interval not displayed.    Liver Function Tests:  Recent Labs  Lab 12/05/18 0140  12/07/18 0240 12/07/18 1530 12/08/18 0500 12/08/18 1532 12/09/18 0200  AST 49*  --   --   --   --   --   --   ALT 21  --   --   --   --   --   --   ALKPHOS 196*  --   --   --   --   --   --   BILITOT 0.6  --   --   --   --   --   --   PROT 5.9*  --   --   --   --   --   --   ALBUMIN 2.1*  2.2*   < > 2.1* 2.3* 2.4* 2.4* 2.4*   < > = values in this interval not displayed.   No results for input(s): LIPASE, AMYLASE in the last 168 hours. No results for input(s): AMMONIA in the last 168 hours.  ABG    Component Value Date/Time   PHART 7.211 (L) 12/08/2018 0340   PCO2ART 58.0 (H) 12/08/2018 0340   PO2ART 40.0 (LL) 12/08/2018 0340   HCO3 23.3 12/08/2018 0340   TCO2 25 12/08/2018 0340   ACIDBASEDEF 5.0 (H) 12/08/2018 0340   O2SAT 63.0 12/08/2018 0340     Coagulation Profile: Recent Labs  Lab 12/03/18 0945  INR 1.1    Cardiac Enzymes: Recent Labs  Lab 12/05/18 0140  TROPONINI 0.21*    HbA1C: Hgb A1c MFr Bld  Date/Time Value Ref Range Status  12/07/2018 02:40 AM 6.6 (H) 4.8 - 5.6 % Final    Comment:    (NOTE) Pre diabetes:          5.7%-6.4% Diabetes:              >6.4% Glycemic control for   <7.0% adults with diabetes     CBG: Recent Labs  Lab 12/08/18 1938 12/08/18 2327 12/09/18 0356 12/09/18 0807 12/09/18 1239  GLUCAP 145* 214* 151* 182* 189*     Critical care time: 33 minutes     Levy Pupa, MD, PhD 12/09/2018, 2:02 PM [facility] [phone] or if no answer [phone]

## 2018-12-09 NOTE — Progress Notes (Signed)
Assisted tele visit to patient with son.  Vayda Dungee Ann, RN  

## 2018-12-09 NOTE — Progress Notes (Signed)
Spoke with daughter gave update on mother status and she stated that they are getting family together to see mother at time of withdraw, will reevaluate tommorow. Will continue to monitor.

## 2018-12-09 NOTE — Progress Notes (Signed)
Note that patient is now moving to comfort care.  Will dc CRRT.  Will sign off.   Vinson Moselle, MD 12/09/2018, 10:22 AM

## 2018-12-09 NOTE — Progress Notes (Signed)
Updated daughter on phone. Organized video chat through elink with son who lives out of town. Gave loving explanation of his moms critical situation and poor prognosis. Allowed son to take his time to ask questions and look at his mom.

## 2018-12-09 NOTE — Progress Notes (Signed)
Spoke with Thelma Barge ( Daughter ) this morning regarding her mothers care and status. She would like the MD to call her before we withdrawal care and transition to comfort care.

## 2018-12-09 NOTE — Progress Notes (Signed)
PROGRESS NOTE  Julia Thomas JGG:[id number] DOB: 1940-11-04 DOA: 12/12/2018  PCP: Leanna Battles, MD  Brief History/Interval Summary: 78 year old African-American female, skilled nursing facility resident, with past medical history significant for recently positive test for COVID 19, chronic kidney disease with unknown baseline, diabetes mellitus, hypertension, CHF, dementia amongst other past medical and cardiac history.  At the skilled nursing facility patient was found to have poor oral intake.  Blood work revealed acute renal failure.  Patient was transferred to the hospital.  Patient became progressively hypoxic.  Transferred to the intensive care unit.  Subsequently intubated.  Also developed acute renal failure requiring CRRT.  Reason for Visit: Acute respiratory failure with hypoxia secondary to COVID-19    Subjective/Interval History: Patient remains intubated and sedated.  No significant events overnight per staff  Assessment/Plan:  Acute Hypoxic Resp. Failure due to Acute Covid 19 Viral Illness  Vent Mode: PRVC FiO2 (%):  [60 %-80 %] 60 % Set Rate:  [35 bmp] 35 bmp Vt Set:  [380 mL] 380 mL PEEP:  [16 cmH20] 16 cmH20 Plateau Pressure:  [29 cmH20-33 cmH20] 30 cmH20     Component Value Date/Time   PHART 7.211 (L) 12/08/2018 0340   PCO2ART 58.0 (H) 12/08/2018 0340   PO2ART 40.0 (LL) 12/08/2018 0340   HCO3 23.3 12/08/2018 0340   TCO2 25 12/08/2018 0340   ACIDBASEDEF 5.0 (H) 12/08/2018 0340   O2SAT 63.0 12/08/2018 0340    COVID-19 Labs  Recent Labs    12/07/18 0240  DDIMER 1.18*  FERRITIN 51  CRP 6.1*     Fever: She was last febrile on 5/15-5/16.  She has been noted to be hypothermic.  Temperature curve has improved in the last 48 hours. Oxygen requirements: On mechanical ventilation, she remains on FiO2 60% and PEEP of 16 . Antibiotics: Completed 5-day course of vancomycin and cefepime  Steroids: Remains on Solu-Medrol.   Diuretics: Not on scheduled  diuretics.  Volume being managed by CRRT Actemra: Has received 2 doses, 5/17 and 5/18 Convalescent Plasma: Not yet  Patient remains on mechanical ventilation.  Pulmonology is following up.  Patient has not shown any improvement in her respiratory status.  Her PaO2 remains extremely low.  She has been tried in prone position previously.  She has completed a 5-day course of antibiotics.  Initially procalcitonin was significantly elevated 11.32.  Improved to 5.82 today.  WBC remains elevated.  MRSA PCR was positive.  Blood cultures have been negative.  Tracheal aspirate was sent for cultures yesterday.  Married discussing with the family, and at this point they are considering comfort care, withdrawal of cares,  The treatment plan and use of medications and known side effects were discussed with patient/family. It was clearly explained that there is no proven definitive treatment for COVID-19 infection yet. Any medications used here are based on case reports/anecdotal data which are not peer-reviewed and has not been studied using randomized control trials.  Complete risks and long-term side effects are unknown, however in the best clinical judgment they seem to be of some clinical benefit rather than medical risks.  Patient/family agree with the treatment plan and want to receive these treatments as indicated.  Patient was subsequently given Actemra.  Hypothermia Most likely due to acute illness/sepsis/CRRT.  TSH noted to be low at 0.156.  Could be sick euthyroid.  Free T4 is 1.05.  Cortisol 11.4 although patient has been on Solu-Medrol blood cultures negative so far.  Temperature curve has improved.  Acute kidney injury on chronic  kidney disease with normal anion gap metabolic acidosis He is on CRRT, nephrology are following, will discontinue given patient is transitioning to comfort care.    Diabetes mellitus type II with hyperglycemia Hyperglycemia secondary to steroids.  Continue Lantus and SSI.   CBG is reasonably well controlled.  HbA1c 6.6.    Septic shock Remains on vasopressors.    Questionable acute blood loss anemia During the earlier part of this hospitalization her hemoglobin dropped to 5.4. Patient was noted to have dark stools.  Unclear if patient truly had GI bleed.  Patient was transfused.  Globin has been stable for the last several days.    Concern for GI bleeding Apparently had dark stool.  Had a drop in hemoglobin requiring blood transfusion.  Hemoglobin has been stable since transfusion.  No further episodes of GI bleeding.  Continue PPI.    Thrombocytopenia Platelet count remains low but stable.  History of dementia Supportive measures.  FEN On CRRT.  Not on any IV fluids.  Monitor electrolytes.  Continue tube feedings.  On free water.    Goals of care: - PCCM has been discussing with family regularly, patient remains significantly ill, with multiple comorbidities, multiorgan failure, difficult to wean from the vent, quiring CRRT, overall very poor prognosis, per PCCM discussion with the family, apparently they are transitioning towards comfort care.   DVT Prophylaxis: Subcutaneous heparin 10,000 units every 8 hours  Lab Results  Component Value Date   PLT 126 (L) 12/08/2018     PUD Prophylaxis: PPI every 12 hours Code Status: Partial code Family Communication: PCCM to discuss with family Disposition Plan: Unclear.  Continue ICU care.   Consultants: Pulmonology.  Nephrology.  Procedures:  Intubated Dialysis catheter placement 5/17 On CRRT  Antibiotics: Anti-infectives (From admission, onward)   Start     Dose/Rate Route Frequency Ordered Stop   12/03/18 2200  vancomycin (VANCOCIN) IVPB 1000 mg/200 mL premix     1,000 mg 200 mL/hr over 60 Minutes Intravenous Every 24 hours 12/03/18 1115 12/06/18 2343   12/03/18 1600  ceFEPIme (MAXIPIME) 2 g in sodium chloride 0.9 % 100 mL IVPB     2 g 200 mL/hr over 30 Minutes Intravenous Every 12 hours  12/03/18 1115 12/07/18 1716   12/02/18 2000  vancomycin (VANCOCIN) IVPB 1000 mg/200 mL premix     1,000 mg 200 mL/hr over 60 Minutes Intravenous  Once 12/02/18 1043 12/02/18 2130   12/01/18 2200  ceFEPIme (MAXIPIME) 1 g in sodium chloride 0.9 % 100 mL IVPB  Status:  Discontinued     1 g 200 mL/hr over 30 Minutes Intravenous Every 24 hours 12/01/18 1227 12/03/18 1115   12/01/18 1800  ceFEPIme (MAXIPIME) 1 g in sodium chloride 0.9 % 100 mL IVPB  Status:  Discontinued     1 g 200 mL/hr over 30 Minutes Intravenous Every 24 hours 11/28/2018 1831 12/04/2018 2040   12/01/18 0754  vancomycin variable dose per unstable renal function (pharmacist dosing)  Status:  Discontinued      Does not apply See admin instructions 12/01/18 0755 12/03/18 1115   11/26/2018 1830  vancomycin variable dose per unstable renal function (pharmacist dosing)  Status:  Discontinued      Does not apply See admin instructions 12/12/2018 1831 11/20/2018 2040   11/23/2018 1700  vancomycin (VANCOCIN) 1,750 mg in sodium chloride 0.9 % 500 mL IVPB     1,750 mg 250 mL/hr over 120 Minutes Intravenous  Once 11/29/2018 1646 11/22/2018 2102   12/09/2018 1700  ceFEPIme (MAXIPIME) 2 g in sodium chloride 0.9 % 100 mL IVPB     2 g 200 mL/hr over 30 Minutes Intravenous  Once 12/08/2018 1646 12/17/2018 1754       Medications:  Scheduled: . chlorhexidine gluconate (MEDLINE KIT)  15 mL Mouth Rinse BID  . Chlorhexidine Gluconate Cloth  6 each Topical Q0600  . free water  200 mL Per Tube Q8H  . heparin  10,000 Units Subcutaneous Q8H  . insulin aspart  2 Units Subcutaneous Q4H  . insulin aspart  3-9 Units Subcutaneous Q4H  . insulin glargine  10 Units Subcutaneous Daily  . mouth rinse  15 mL Mouth Rinse 10 times per day  . methylPREDNISolone (SOLU-MEDROL) injection  40 mg Intravenous Q24H  . pantoprazole (PROTONIX) IV  40 mg Intravenous Q12H   Continuous: . feeding supplement (VITAL HIGH PROTEIN) 1,000 mL (12/09/18 0403)  . fentaNYL infusion INTRAVENOUS  50 mcg/hr (12/09/18 0900)  . midazolam 2 mg/hr (12/09/18 0900)  . norepinephrine (LEVOPHED) Adult infusion Stopped (12/09/18 0900)   JWJ:XBJYNWGN, hydrALAZINE, midazolam   Objective:  Vital Signs  Vitals:   12/09/18 0900 12/09/18 1000 12/09/18 1100 12/09/18 1200  BP: 136/69 103/63 116/63 (!) 107/54  Pulse: 80 95 (!) 101 100  Resp: (!) 25 (!) 22 (!) 24 20  Temp:    98.2 F (36.8 C)  TempSrc:    Oral  SpO2: 100% 95% 99% 96%  Weight:      Height:        Intake/Output Summary (Last 24 hours) at 12/09/2018 1324 Last data filed at 12/09/2018 1200 Gross per 24 hour  Intake 995.35 ml  Output 896 ml  Net 99.35 ml   Filed Weights   12/06/18 0447 12/07/18 0545 12/08/18 0449  Weight: 113.9 kg 113.8 kg 101.4 kg   Dated, intubated, in no apparent distress Symmetrical Chest wall movement, Good air movement bilaterally, CTAB RRR,No Gallops,Rubs or new Murmurs, No Parasternal Heave +ve B.Sounds, Abd Soft, No tenderness, No rebound - guarding or rigidity. 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    Lab Results:  Data Reviewed: I have personally reviewed following labs and imaging studies  CBC: Recent Labs  Lab 12/03/18 0249  12/04/18 0245  12/05/18 0140  12/06/18 0220  12/07/18 0240 12/07/18 0515 12/07/18 0706 12/07/18 1156 12/08/18 0340 12/08/18 0440  WBC 6.0  --  6.0  --  10.3  --  12.9*  --  14.5*  --   --   --   --  24.8*  NEUTROABS 4.7  --   --   --  8.1*  --   --   --  12.5*  --   --   --   --  20.4*  HGB 9.6*   < > 11.2*  11.2*   < > 11.4*   < > 10.8*   < > 11.0* 10.5* 10.5* 11.2* 11.6* 11.1*  HCT 29.8*   < > 33.7*  33.8*   < > 35.0*   < > 34.1*   < > 34.8* 31.0* 31.0* 33.0* 34.0* 36.5  MCV 87.4  --  86.2  --  87.1  --  88.8  --  88.8  --   --   --   --  91.0  PLT 144*  --  129*  --  131*  --  102*  --  100*  --   --   --   --  126*   < > = values in this interval not displayed.  Basic Metabolic Panel: Recent Labs  Lab 12/03/18 0249  12/04/18 0245  12/07/18 0240   12/07/18 1530 12/08/18 0340 12/08/18 0500 12/08/18 1532 12/09/18 0200  NA 145   < > 140   < > 135   < > 136 138 136 134* 137  K 4.9   < > 4.1   < > 4.3   < > 4.6 4.8 4.8 4.7 4.4  CL 118*  --  109   < > 103  --  103  --  104 102 103  CO2 15*  --  20*   < > 22  --  25  --  20* 22 26  GLUCOSE 250*  --  264*   < > 218*  --  212*  --  233* 223* 217*  BUN 82*  --  60*   < > 27*  --  26*  --  27* 29* 28*  CREATININE 6.62*  --  3.98*   < > 1.59*  --  1.51*  --  1.71* 1.59* 1.56*  CALCIUM 7.4*  --  7.3*   < > 7.2*  --  7.3*  --  7.3* 7.5* 7.5*  MG 2.2  --  2.3  --   --   --   --   --   --   --   --   PHOS 5.3*  --  3.4   < > 2.4*  --  2.4*  --  2.8 2.3* 2.1*   < > = values in this interval not displayed.    GFR: Estimated Creatinine Clearance: 35.5 mL/min (A) (by C-G formula based on SCr of 1.56 mg/dL (H)).  Liver Function Tests: Recent Labs  Lab 12/05/18 0140  12/07/18 0240 12/07/18 1530 12/08/18 0500 12/08/18 1532 12/09/18 0200  AST 49*  --   --   --   --   --   --   ALT 21  --   --   --   --   --   --   ALKPHOS 196*  --   --   --   --   --   --   BILITOT 0.6  --   --   --   --   --   --   PROT 5.9*  --   --   --   --   --   --   ALBUMIN 2.1*  2.2*   < > 2.1* 2.3* 2.4* 2.4* 2.4*   < > = values in this interval not displayed.     Coagulation Profile: Recent Labs  Lab 12/03/18 0945  INR 1.1    Cardiac Enzymes: Recent Labs  Lab 12/05/18 0140  TROPONINI 0.21*     CBG: Recent Labs  Lab 12/08/18 1938 12/08/18 2327 12/09/18 0356 12/09/18 0807 12/09/18 1239  GLUCAP 145* 214* 151* 182* 189*    Anemia Panel: Recent Labs    12/07/18 0240  FERRITIN 51    Recent Results (from the past 240 hour(s))  Blood Culture (routine x 2)     Status: None   Collection Time: 12/01/2018  4:45 PM  Result Value Ref Range Status   Specimen Description BLOOD RIGHT HAND  Final   Special Requests   Final    BOTTLES DRAWN AEROBIC AND ANAEROBIC Blood Culture adequate volume    Culture   Final    NO GROWTH 5 DAYS Performed at [facility], [place] [address], [place], [place] [address]  Report Status 12/05/2018 FINAL  Final  Blood Culture (routine x 2)     Status: None   Collection Time: 11/27/2018  5:10 PM  Result Value Ref Range Status   Specimen Description BLOOD LEFT HAND  Final   Special Requests   Final    BOTTLES DRAWN AEROBIC ONLY Blood Culture results may not be optimal due to an inadequate volume of blood received in culture bottles   Culture   Final    NO GROWTH 5 DAYS Performed at [facility], [place] [address] [place], [place] [address]    Report Status 12/05/2018 FINAL  Final  Urine culture     Status: Abnormal   Collection Time: 12/07/2018  5:28 PM  Result Value Ref Range Status   Specimen Description URINE, CATHETERIZED  Final   Special Requests NONE  Final   Culture (A)  Final    <10,000 COLONIES/mL INSIGNIFICANT GROWTH Performed at [facility], [place] [address] [place], [place] [address]    Report Status 12/01/2018 FINAL  Final  MRSA PCR Screening     Status: Abnormal   Collection Time: 12/02/18 11:21 PM  Result Value Ref Range Status   MRSA by PCR POSITIVE (A) NEGATIVE Final    Comment:        The GeneXpert MRSA Assay (FDA approved for NASAL specimens only), is one component of a comprehensive MRSA colonization surveillance program. It is not intended to diagnose MRSA infection nor to guide or monitor treatment for MRSA infections. RESULT CALLED TO, READ BACK BY AND VERIFIED WITH: Crista Elliot RN @ [phone] ON 12/03/18 BY ROBINSON Z.  Performed at [facility], [place] [address], [place], [place] [address]   Culture, blood (routine x 2)     Status: None   Collection Time: 12/03/18  2:30 AM  Result Value Ref Range Status   Specimen Description BLOOD LEFT ANTECUBITAL  Final   Special Requests   Final    BOTTLES DRAWN AEROBIC ONLY Blood Culture adequate volume   Culture   Final    NO GROWTH 5 DAYS Performed at [facility], [place] [address] [place], [place] [address]    Report Status 12/08/2018 FINAL  Final  Culture, blood (routine x 2)     Status: None   Collection Time: 12/03/18  2:45 AM  Result Value Ref Range Status   Specimen Description BLOOD LEFT HAND  Final   Special Requests   Final    BOTTLES DRAWN AEROBIC ONLY Blood Culture adequate volume   Culture   Final    NO GROWTH 5 DAYS Performed at [facility], [place] [address] [place], [place] [address]    Report Status 12/08/2018 FINAL  Final  Culture, respiratory (non-expectorated)     Status: None (Preliminary result)   Collection Time: 12/07/18  3:05 PM  Result Value Ref Range Status   Specimen Description   Final    TRACHEAL ASPIRATE Performed at [place] [address], [place], [place] [address]    Special Requests   Final    NONE Performed at [facility], [place] [address], [place], [place] [address]    Gram Stain   Final    ABUNDANT WBC PRESENT,BOTH PMN AND MONONUCLEAR RARE GRAM POSITIVE COCCI RARE GRAM VARIABLE ROD    Culture   Final    FEW STAPHYLOCOCCUS AUREUS SUSCEPTIBILITIES TO FOLLOW Performed at [facility], [place],  [place] [address]    Report Status PENDING  Incomplete      Radiology Studies: Dg Chest Port 1 View  Result Date: 12/07/2018 CLINICAL DATA:  Pneumonia EXAM: PORTABLE CHEST 1 VIEW COMPARISON:  12/06/2018 FINDINGS: There is stable positioning of the bilateral central venous catheters, endotracheal tube, and the enteric tube. Diffuse hazy bilateral airspace opacities are again noted with more focal consolidation at the lung bases. There is no pneumothorax. There may be small bilateral pleural effusions. The cardiac silhouette is stable. IMPRESSION: 1. Lines and tubes as above. 2. Persistent multifocal consolidation without significant interval improvement. Electronically Signed   By: Constance Holster M.D.   On: 12/07/2018 14:29        LOS: 9 days   Phillips Climes MD  [facility] Pager on [email or web address]  12/09/2018, 1:24 PM

## 2018-12-09 NOTE — Progress Notes (Signed)
Unnecessary stimulation including turns and baths on hold due to labile blood pressure

## 2018-12-10 LAB — RENAL FUNCTION PANEL
Albumin: 2.3 g/dL — ABNORMAL LOW (ref 3.5–5.0)
Anion gap: 11 (ref 5–15)
BUN: 53 mg/dL — ABNORMAL HIGH (ref 8–23)
CO2: 21 mmol/L — ABNORMAL LOW (ref 22–32)
Calcium: 7.6 mg/dL — ABNORMAL LOW (ref 8.9–10.3)
Chloride: 104 mmol/L (ref 98–111)
Creatinine, Ser: 3.02 mg/dL — ABNORMAL HIGH (ref 0.44–1.00)
GFR calc Af Amer: 17 mL/min — ABNORMAL LOW (ref >60)
GFR calc non Af Amer: 14 mL/min — ABNORMAL LOW (ref >60)
Glucose, Bld: 317 mg/dL — ABNORMAL HIGH (ref 70–99)
Phosphorus: 3.7 mg/dL (ref 2.5–4.6)
Potassium: 5 mmol/L (ref 3.5–5.1)
Sodium: 136 mmol/L (ref 135–145)

## 2018-12-10 LAB — GLUCOSE, CAPILLARY
Glucose-Capillary: 286 mg/dL — ABNORMAL HIGH (ref 70–99)
Glucose-Capillary: 275 mg/dL — ABNORMAL HIGH (ref 70–99)
Glucose-Capillary: 211 mg/dL — ABNORMAL HIGH (ref 70–99)
Glucose-Capillary: 186 mg/dL — ABNORMAL HIGH (ref 70–99)

## 2018-12-10 LAB — CULTURE, RESPIRATORY W GRAM STAIN

## 2018-12-10 MED ORDER — MIDAZOLAM HCL 2 MG/2ML IJ SOLN: 1.0000 mg | INTRAMUSCULAR | Status: DC | PRN

## 2018-12-10 MED ORDER — GLYCOPYRROLATE 0.2 MG/ML IJ SOLN: 0.2000 mg | INTRAMUSCULAR | Status: DC | PRN

## 2018-12-10 MED ORDER — FENTANYL CITRATE (PF) 100 MCG/2ML IJ SOLN: 50.0000 ug | INTRAMUSCULAR | Status: DC | PRN

## 2018-12-10 MED ORDER — POLYVINYL ALCOHOL 1.4 % OP SOLN
1.0000 [drp] | Freq: Four times a day (QID) | OPHTHALMIC | Status: DC | PRN
  Filled 2018-12-10: qty 15

## 2018-12-10 MED ORDER — GLYCOPYRROLATE 1 MG PO TABS
1.0000 mg | ORAL_TABLET | ORAL | Status: DC | PRN
  Filled 2018-12-10: qty 1

## 2018-12-10 MED ORDER — DEXTROSE 5 % IV SOLN: INTRAVENOUS | Status: DC

## 2018-12-18 NOTE — Progress Notes (Signed)
Spoke with pt's daughter [name] on the phone. I did not want to call too early to make them feel rushed on their decision. She would like to speak with the MD once more before she gives [place] a time when the family will have gathered to witness the withdraw of care. I will update CCM and await a call-back from family with a time to withdraw. Family was very appreciative of the update and of the care their mother has received. Elizette Shek, [place] Scrape, RN

## 2018-12-18 NOTE — Procedures (Signed)
**Note De-Identified Samantha Olivera Obfuscation** Extubation Procedure Note  Patient Details:   Name: Julia Thomas DOB: [birth year] MRN: [id number]   Airway Documentation:    Vent end date: 12/15/2018 Vent end time: 1820   Evaluation  O2 sats: transiently fell during during procedure Complications: No apparent complications Patient did tolerate procedure well. Bilateral Breath Sounds: Diminished   No Extubated for comfort Mayzee Reichenbach, Megan Salon 12/09/2018, 6:31 PM

## 2018-12-18 NOTE — Progress Notes (Signed)
Patient's Time of death was 41 and verified by this RN and Caleen Jobs RN. CDC notified as well as the attending MD. They are unsure of the funeral home they will use but will call when they know.   42mL of IV Versed and 200 mL of IV Fentanyl wasted with Essie Hart RN. Larrisa Cravey, [place] Scrape, RN

## 2018-12-18 NOTE — Care Management (Signed)
12/11/18 4784 Case manager accessed records for completion of documentation. May God rest this dear soul.    Vance Peper, RN BSN Case Manager [phone]

## 2018-12-18 NOTE — Progress Notes (Signed)
Family is on the [facility] call with Mrs. Julia Thomas. We are waiting for comfort orders from the MD and then we will withdraw. Geryl Dohn, [place] Scrape, RN

## 2018-12-18 NOTE — Progress Notes (Signed)
PROGRESS NOTE  Julia Thomas ZOX:[id number] DOB: 03-30-1941 DOA: 11/24/2018  PCP: Leanna Battles, MD  Brief History/Interval Summary: 78 year old African-American female, skilled nursing facility resident, with past medical history significant for recently positive test for COVID 19, chronic kidney disease with unknown baseline, diabetes mellitus, hypertension, CHF, dementia amongst other past medical and cardiac history.  At the skilled nursing facility patient was found to have poor oral intake.  Blood work revealed acute renal failure.  Patient was transferred to the hospital.  Patient became progressively hypoxic.  Transferred to the intensive care unit.  Subsequently intubated.  Also developed acute renal failure requiring CRRT.  Reason for Visit: Acute respiratory failure with hypoxia secondary to COVID-19    Subjective/Interval History: Patient remains intubated and sedated.  No significant events overnight per staff  Assessment/Plan:  Acute Hypoxic Resp. Failure due to Acute Covid 19 Viral Illness  Vent Mode: PRVC FiO2 (%):  [60 %-80 %] 60 % Set Rate:  [35 bmp] 35 bmp Vt Set:  [380 mL] 380 mL PEEP:  [16 cmH20] 16 cmH20 Plateau Pressure:  [29 cmH20-33 cmH20] 30 cmH20     Component Value Date/Time   PHART 7.211 (L) 12/08/2018 0340   PCO2ART 58.0 (H) 12/08/2018 0340   PO2ART 40.0 (LL) 12/08/2018 0340   HCO3 23.3 12/08/2018 0340   TCO2 25 12/08/2018 0340   ACIDBASEDEF 5.0 (H) 12/08/2018 0340   O2SAT 63.0 12/08/2018 0340    COVID-19 Labs  Recent Labs    12/07/18 0240  DDIMER 1.18*  FERRITIN 51  CRP 6.1*     Fever: She was last febrile on 5/15-5/16.  She has been noted to be hypothermic.  Temperature curve has improved in the last 48 hours. Oxygen requirements: On mechanical ventilation, she remains on FiO2 60% and PEEP of 16 . Antibiotics: Completed 5-day course of vancomycin and cefepime  Steroids: Remains on Solu-Medrol.   Diuretics: Not on scheduled  diuretics.  Volume being managed by CRRT Actemra: Has received 2 doses, 5/17 and 5/18 Convalescent Plasma: Not yet  Patient remains on mechanical ventilation.  Pulmonary is following, ARDS, has very poor prognosis anticipated, monitoring discussed with family about withdrawal of care, are preparing for withdrawal of care,. -She was treated with Actemra, steroids, with no improvement.  The treatment plan and use of medications and known side effects were discussed with patient/family. It was clearly explained that there is no proven definitive treatment for COVID-19 infection yet. Any medications used here are based on case reports/anecdotal data which are not peer-reviewed and has not been studied using randomized control trials.  Complete risks and long-term side effects are unknown, however in the best clinical judgment they seem to be of some clinical benefit rather than medical risks.  Patient/family agree with the treatment plan and want to receive these treatments as indicated.  Patient was subsequently given Actemra.  Hypothermia Most likely due to acute illness/sepsis/CRRT.  TSH noted to be low at 0.156.  Could be sick euthyroid.  Free T4 is 1.05.  Cortisol 11.4 although patient has been on Solu-Medrol blood cultures negative so far.  Temperature curve has improved.  Acute kidney injury on chronic kidney disease with normal anion gap metabolic acidosis CRRT, stopped today as preparing for withdrawal of care  Diabetes mellitus type II with hyperglycemia Hyperglycemia secondary to steroids.  Continue Lantus and SSI.  CBG is reasonably well controlled.  HbA1c 6.6.    Septic shock She remains on pressor support  Questionable acute blood loss anemia During the  earlier part of this hospitalization her hemoglobin dropped to 5.4. Patient was noted to have dark stools.  Unclear if patient truly had GI bleed.  Patient was transfused.  Globin has been stable for the last several days.    Concern  for GI bleeding Apparently had dark stool.  Had a drop in hemoglobin requiring blood transfusion.  Hemoglobin has been stable since transfusion.  No further episodes of GI bleeding.  Continue PPI.    Thrombocytopenia Platelet count remains low but stable.  History of dementia Supportive measures.  FEN On CRRT.  Not on any IV fluids.  Monitor electrolytes.  Continue tube feedings.  On free water.    Goals of care: - PCCM has been discussing with family regularly, patient remains significantly ill, with multiple comorbidities, multiorgan failure, difficult to wean from the vent, quiring CRRT, overall very poor prognosis, per PCCM discussion with the family, apparently they are transitioning towards comfort care.   DVT Prophylaxis: Subcutaneous heparin 10,000 units every 8 hours  Lab Results  Component Value Date   PLT 126 (L) 12/08/2018     PUD Prophylaxis: PPI every 12 hours Code Status: Partial code Family Communication: PCCM to discuss with family Disposition Plan: Unclear.  Continue ICU care.   Consultants: Pulmonology.  Nephrology.  Procedures:  Intubated Dialysis catheter placement 5/17 On CRRT  Antibiotics: Anti-infectives (From admission, onward)   Start     Dose/Rate Route Frequency Ordered Stop   12/03/18 2200  vancomycin (VANCOCIN) IVPB 1000 mg/200 mL premix     1,000 mg 200 mL/hr over 60 Minutes Intravenous Every 24 hours 12/03/18 1115 12/06/18 2343   12/03/18 1600  ceFEPIme (MAXIPIME) 2 g in sodium chloride 0.9 % 100 mL IVPB     2 g 200 mL/hr over 30 Minutes Intravenous Every 12 hours 12/03/18 1115 12/07/18 1716   12/02/18 2000  vancomycin (VANCOCIN) IVPB 1000 mg/200 mL premix     1,000 mg 200 mL/hr over 60 Minutes Intravenous  Once 12/02/18 1043 12/02/18 2130   12/01/18 2200  ceFEPIme (MAXIPIME) 1 g in sodium chloride 0.9 % 100 mL IVPB  Status:  Discontinued     1 g 200 mL/hr over 30 Minutes Intravenous Every 24 hours 12/01/18 1227 12/03/18 1115    12/01/18 1800  ceFEPIme (MAXIPIME) 1 g in sodium chloride 0.9 % 100 mL IVPB  Status:  Discontinued     1 g 200 mL/hr over 30 Minutes Intravenous Every 24 hours 12/15/2018 1831 11/29/2018 2040   12/01/18 0754  vancomycin variable dose per unstable renal function (pharmacist dosing)  Status:  Discontinued      Does not apply See admin instructions 12/01/18 0755 12/03/18 1115   12/16/2018 1830  vancomycin variable dose per unstable renal function (pharmacist dosing)  Status:  Discontinued      Does not apply See admin instructions 11/27/2018 1831 12/01/2018 2040   11/23/2018 1700  vancomycin (VANCOCIN) 1,750 mg in sodium chloride 0.9 % 500 mL IVPB     1,750 mg 250 mL/hr over 120 Minutes Intravenous  Once 12/08/2018 1646 12/08/2018 2102   12/07/2018 1700  ceFEPIme (MAXIPIME) 2 g in sodium chloride 0.9 % 100 mL IVPB     2 g 200 mL/hr over 30 Minutes Intravenous  Once 12/03/2018 1646 11/29/2018 1754       Medications:  Scheduled: . chlorhexidine gluconate (MEDLINE KIT)  15 mL Mouth Rinse BID  . Chlorhexidine Gluconate Cloth  6 each Topical Q0600  . free water  200 mL Per  Tube Q8H  . heparin  10,000 Units Subcutaneous Q8H  . insulin aspart  2 Units Subcutaneous Q4H  . insulin aspart  3-9 Units Subcutaneous Q4H  . insulin glargine  10 Units Subcutaneous Daily  . mouth rinse  15 mL Mouth Rinse 10 times per day  . pantoprazole (PROTONIX) IV  40 mg Intravenous Q12H   Continuous: . feeding supplement (VITAL HIGH PROTEIN) Stopped (12/09/18 1700)  . fentaNYL infusion INTRAVENOUS 50 mcg/hr (January 02, 2019 1400)  . midazolam 2 mg/hr (01-02-19 1400)  . norepinephrine (LEVOPHED) Adult infusion 4 mcg/min (2019/01/02 1400)   VQQ:VZDGLOVF, hydrALAZINE, midazolam   Objective:  Vital Signs  Vitals:   2019-01-02 1126 2019/01/02 1200 January 02, 2019 1300 2019/01/02 1400  BP:  (!) 114/54  (!) 122/56  Pulse:  83 72 69  Resp:  (!) 35 (!) 35 (!) 35  Temp: 98.7 F (37.1 C)     TempSrc: Axillary     SpO2:  99% 100% 100%  Weight:       Height:        Intake/Output Summary (Last 24 hours) at 02-Jan-2019 1502 Last data filed at 01-02-2019 1400 Gross per 24 hour  Intake 332.4 ml  Output 0 ml  Net 332.4 ml   Filed Weights   12/06/18 0447 12/07/18 0545 12/08/18 0449  Weight: 113.9 kg 113.8 kg 101.4 kg   Sedated, intubated, ill-appearing, in no apparent distress Coarse respiratory sounds bilaterally RRR,No Gallops,Rubs or new Murmurs, No Parasternal Heave +ve B.Sounds, Abd Soft, No tenderness, No rebound - guarding or rigidity. No Cyanosis, Clubbing or edema, right BKA     Lab Results:  Data Reviewed: I have personally reviewed following labs and imaging studies  CBC: Recent Labs  Lab 12/04/18 0245  12/05/18 0140  12/06/18 0220  12/07/18 0240 12/07/18 0515 12/07/18 0706 12/07/18 1156 12/08/18 0340 12/08/18 0440  WBC 6.0  --  10.3  --  12.9*  --  14.5*  --   --   --   --  24.8*  NEUTROABS  --   --  8.1*  --   --   --  12.5*  --   --   --   --  20.4*  HGB 11.2*  11.2*   < > 11.4*   < > 10.8*   < > 11.0* 10.5* 10.5* 11.2* 11.6* 11.1*  HCT 33.7*  33.8*   < > 35.0*   < > 34.1*   < > 34.8* 31.0* 31.0* 33.0* 34.0* 36.5  MCV 86.2  --  87.1  --  88.8  --  88.8  --   --   --   --  91.0  PLT 129*  --  131*  --  102*  --  100*  --   --   --   --  126*   < > = values in this interval not displayed.    Basic Metabolic Panel: Recent Labs  Lab 12/04/18 0245  12/07/18 1530 12/08/18 0340 12/08/18 0500 12/08/18 1532 12/09/18 0200 01/02/2019 0332  NA 140   < > 136 138 136 134* 137 136  K 4.1   < > 4.6 4.8 4.8 4.7 4.4 5.0  CL 109   < > 103  --  104 102 103 104  CO2 20*   < > 25  --  20* 22 26 21*  GLUCOSE 264*   < > 212*  --  233* 223* 217* 317*  BUN 60*   < > 26*  --  27* 29* 28* 53*  CREATININE 3.98*   < > 1.51*  --  1.71* 1.59* 1.56* 3.02*  CALCIUM 7.3*   < > 7.3*  --  7.3* 7.5* 7.5* 7.6*  MG 2.3  --   --   --   --   --   --   --   PHOS 3.4   < > 2.4*  --  2.8 2.3* 2.1* 3.7   < > = values in this interval  not displayed.    GFR: Estimated Creatinine Clearance: 18.4 mL/min (A) (by C-G formula based on SCr of 3.02 mg/dL (H)).  Liver Function Tests: Recent Labs  Lab 12/05/18 0140  12/07/18 1530 12/08/18 0500 12/08/18 1532 12/09/18 0200 2018-12-12 0332  AST 49*  --   --   --   --   --   --   ALT 21  --   --   --   --   --   --   ALKPHOS 196*  --   --   --   --   --   --   BILITOT 0.6  --   --   --   --   --   --   PROT 5.9*  --   --   --   --   --   --   ALBUMIN 2.1*  2.2*   < > 2.3* 2.4* 2.4* 2.4* 2.3*   < > = values in this interval not displayed.     Coagulation Profile: No results for input(s): INR, PROTIME in the last 168 hours.  Cardiac Enzymes: Recent Labs  Lab 12/05/18 0140  TROPONINI 0.21*     CBG: Recent Labs  Lab 12/09/18 2034 12/09/18 2332 12-12-18 0328 Dec 12, 2018 0757 12-Dec-2018 1129  GLUCAP 282* 251* 286* 275* 211*    Anemia Panel: No results for input(s): VITAMINB12, FOLATE, FERRITIN, TIBC, IRON, RETICCTPCT in the last 72 hours.  Recent Results (from the past 240 hour(s))  Blood Culture (routine x 2)     Status: None   Collection Time: 12/05/2018  4:45 PM  Result Value Ref Range Status   Specimen Description BLOOD RIGHT HAND  Final   Special Requests   Final    BOTTLES DRAWN AEROBIC AND ANAEROBIC Blood Culture adequate volume   Culture   Final    NO GROWTH 5 DAYS Performed at [facility], [address], [place], [place] [address]    Report Status 12/05/2018 FINAL  Final  Blood Culture (routine x 2)     Status: None   Collection Time: 11/18/2018  5:10 PM  Result Value Ref Range Status   Specimen Description BLOOD LEFT HAND  Final   Special Requests   Final    BOTTLES DRAWN AEROBIC ONLY Blood Culture results may not be optimal due to an inadequate volume of blood received in culture bottles   Culture   Final    NO GROWTH 5 DAYS Performed at [facility], [place] [address], [place], [place] [address]    Report Status 12/05/2018 FINAL   Final  Urine culture     Status: Abnormal   Collection Time: 11/17/2018  5:28 PM  Result Value Ref Range Status   Specimen Description URINE, CATHETERIZED  Final   Special Requests NONE  Final   Culture (A)  Final    <10,000 COLONIES/mL INSIGNIFICANT GROWTH Performed at [facility], [place] [address] [place], [place] [address]    Report Status  12/01/2018 FINAL  Final  MRSA PCR Screening     Status: Abnormal   Collection Time: 12/02/18 11:21 PM  Result Value Ref Range Status   MRSA by PCR POSITIVE (A) NEGATIVE Final    Comment:        The GeneXpert MRSA Assay (FDA approved for NASAL specimens only), is one component of a comprehensive MRSA colonization surveillance program. It is not intended to diagnose MRSA infection nor to guide or monitor treatment for MRSA infections. RESULT CALLED TO, READ BACK BY AND VERIFIED WITH: Crista Elliot RN @ [phone] ON 12/03/18 BY ROBINSON Z.  Performed at [facility], [place] [address] [place], [place] [address]   Culture, blood (routine x 2)     Status: None   Collection Time: 12/03/18  2:30 AM  Result Value Ref Range Status   Specimen Description BLOOD LEFT ANTECUBITAL  Final   Special Requests   Final    BOTTLES DRAWN AEROBIC ONLY Blood Culture adequate volume   Culture   Final    NO GROWTH 5 DAYS Performed at [facility], [place] [address] [place], [place] [address]    Report Status 12/08/2018 FINAL  Final  Culture, blood (routine x 2)     Status: None   Collection Time: 12/03/18  2:45 AM  Result Value Ref Range Status   Specimen Description BLOOD LEFT HAND  Final   Special Requests   Final    BOTTLES DRAWN AEROBIC ONLY Blood Culture adequate volume   Culture   Final    NO GROWTH 5 DAYS Performed at [facility], [place] [address] [place], [place] [address]    Report Status 12/08/2018 FINAL  Final  Culture, respiratory (non-expectorated)     Status: None   Collection Time: 12/07/18  3:05 PM  Result Value Ref Range  Status   Specimen Description   Final    TRACHEAL ASPIRATE Performed at [place] [address] [place], [place] [address]    Special Requests   Final    NONE Performed at [facility], [place] [address], [place], [place] [address]    Gram Stain   Final    ABUNDANT WBC PRESENT,BOTH PMN AND MONONUCLEAR RARE GRAM POSITIVE COCCI RARE GRAM VARIABLE ROD Performed at [facility], [place] [address] [place],  [address]    Culture FEW METHICILLIN RESISTANT STAPHYLOCOCCUS AUREUS  Final   Report Status 2018-12-21 FINAL  Final   Organism ID, Bacteria METHICILLIN RESISTANT STAPHYLOCOCCUS AUREUS  Final      Susceptibility   Methicillin resistant staphylococcus aureus - MIC*    CIPROFLOXACIN >=8 RESISTANT Resistant     ERYTHROMYCIN >=8 RESISTANT Resistant     GENTAMICIN <=0.5 SENSITIVE Sensitive     OXACILLIN >=4 RESISTANT Resistant     TETRACYCLINE <=1 SENSITIVE Sensitive     VANCOMYCIN 1 SENSITIVE Sensitive     TRIMETH/SULFA >=320 RESISTANT Resistant     CLINDAMYCIN <=0.25 SENSITIVE Sensitive     RIFAMPIN <=0.5 SENSITIVE Sensitive     Inducible Clindamycin NEGATIVE Sensitive     * FEW METHICILLIN RESISTANT STAPHYLOCOCCUS AUREUS      Radiology Studies: No results found.     LOS: 10 days   Phillips Climes MD  [facility] Pager on [email or web address]  12/21/18, 3:02 PM

## 2018-12-18 NOTE — Death Summary Note (Signed)
DEATH SUMMARY   Patient Details  Name: Julia Thomas MRN: [id number] DOB: 1941-06-26  Admission/Discharge Information   Admit Date:  12-12-2018  Date of Death: Date of Death: Dec 22, 2018  Time of Death: Time of Death: 11-14-1837  Length of Stay: November 08, 2022  Referring Physician: Jarome Matin, MD   Reason(s) for Hospitalization   Cause of death: -Acute hypoxic respiratory failure due to COVID-19 -COVID 19 infection  Diagnoses  Preliminary cause of death:  Secondary Diagnoses (including complications and co-morbidities):  Active Problems:   AKI (acute kidney injury) (HCC)   Encounter for central line placement   Acute respiratory distress syndrome (ARDS) due to COVID-19 virus   COVID-19 virus detected   Pressure injury of skin   Brief Hospital Course (including significant findings, care, treatment, and services provided and events leading to death)  Julia Thomas is a 78 y.o. year old female skilled nursing facility resident, with past medical history significant for recently positive test for COVID 19, chronic kidney disease with unknown baseline, diabetes mellitus, hypertension, CHF, dementia amongst other past medical and cardiac history.  At the skilled nursing facility patient was found to have poor oral intake.  Blood work revealed acute renal failure.  Patient was transferred to the hospital.  Patient became progressively hypoxic.  Transferred to the intensive care unit.  Subsequently intubated.  Also developed acute renal failure requiring CRRT.  Patient with ARDS, with a very poor prognosis, PCCM discussed with family, poor life quality at baseline, with very poor prognosis, started on CRRT, intubated, with very poor weaning, decision has been made to comfort care,/withdrawal of care, which has been done on 12/11/2018 .  Discharge Diagnosis :  Acute Hypoxic Resp. Failure due to Acute Covid 19 Viral Illness Hypothermia Acute kidney injury on chronic kidney disease with normal  anion gap metabolic acidosis Diabetes mellitus type II with hyperglycemia Septic shock  acute blood loss anemia  Possible  GI bleeding Thrombocytopenia History of dementia      Pertinent Labs and Studies  Significant Diagnostic Studies Dg Abd 1 View  Result Date: 12/02/2018 CLINICAL DATA:  Bedside nasogastric tube placement. EXAM: ABDOMEN - 1 VIEW COMPARISON:  CT abdomen and pelvis yesterday. Abdominal x-ray 12-12-2018. FINDINGS: Nasogastric tube looped in the stomach with its tip at the expected location of the gastric body. Visualized bowel gas pattern nonobstructive and unchanged. IMPRESSION: Nasogastric tube looped in the stomach with its tip at the expected location of the gastric body. Electronically Signed   By: Hulan Saas M.D.   On: 12/02/2018 14:17   Dg Chest Port 1 View  Result Date: 12/07/2018 CLINICAL DATA:  Pneumonia EXAM: PORTABLE CHEST 1 VIEW COMPARISON:  12/06/2018 FINDINGS: There is stable positioning of the bilateral central venous catheters, endotracheal tube, and the enteric tube. Diffuse hazy bilateral airspace opacities are again noted with more focal consolidation at the lung bases. There is no pneumothorax. There may be small bilateral pleural effusions. The cardiac silhouette is stable. IMPRESSION: 1. Lines and tubes as above. 2. Persistent multifocal consolidation without significant interval improvement. Electronically Signed   By: Katherine Mantle M.D.   On: 12/07/2018 14:29   Dg Chest Port 1 View  Result Date: 12/06/2018 CLINICAL DATA:  Pneumonia EXAM: PORTABLE CHEST 1 VIEW COMPARISON:  12/04/2018 FINDINGS: Cardiac shadow is stable. Bilateral jugular catheters, endotracheal tube and gastric catheter are again seen and stable. Patchy multifocal infiltrate is seen although improved aeration in the right upper lobe is noted when compare with the prior study. IMPRESSION: Patchy  bilateral infiltrates although significant improved aeration in the right upper  lobe is seen. Electronically Signed   By: Alcide Clever M.D.   On: 12/06/2018 07:19   Dg Chest Port 1 View  Result Date: 12/04/2018 CLINICAL DATA:  Endotracheal tube EXAM: PORTABLE CHEST 1 VIEW COMPARISON:  Yesterday FINDINGS: Endotracheal tube tip at the clavicular heads. Bilateral central line with tip at the upper cavoatrial junction. The orogastric tube at least reaches the stomach. Bilateral airspace disease with dense consolidation in the right upper lobe. No effusion or pneumothorax. Stable heart size. IMPRESSION: Stable hardware positioning and extensive airspace disease. Electronically Signed   By: Marnee Spring M.D.   On: 12/04/2018 07:35   Dg Chest Port 1 View  Result Date: 12/03/2018 CLINICAL DATA:  See evaluate central venous catheter placement EXAM: PORTABLE CHEST 1 VIEW COMPARISON:  12/02/2018 FINDINGS: ET tube tip is above the carina. There is a right IJ catheter with tip in the projection of the cavoatrial junction. Interval placement of left IJ catheter. Tip is also in the projection of the cavoatrial junction. No pneumothorax identified. Enteric tube tip is below the level of the GE junction. There is been significant interval worsening of aeration to the right upper lobe compared with previous exam. Patchy airspace densities within the right lower lobe and left lung appears similar. IMPRESSION: 1. Left IJ catheter tip projects over the expected location of the cavoatrial junction. No pneumothorax. 2. Significant worsening aeration to the right upper lobe. Electronically Signed   By: Signa Kell M.D.   On: 12/03/2018 13:33   Dg Chest Port 1 View  Result Date: 12/02/2018 CLINICAL DATA:  Respiratory failure. EXAM: PORTABLE CHEST 1 VIEW COMPARISON:  Radiographs earlier this day. FINDINGS: Endotracheal tube tip is 16 mm from the carina. Tip and side port of the enteric tube below the diaphragm in the stomach. Right internal jugular central venous catheter tip in the atrial caval  junction. Patient is rotated. Multifocal airspace opacities, right greater than left, slightly progressed in the upper lung zones. Unchanged heart size and mediastinal contours allowing for rotation. No large pleural effusion. No pneumothorax. IMPRESSION: 1. Slightly radiographic progression of multifocal Covid pneumonia, right greater than left. 2. Endotracheal tube tip borderline low 16 mm from the carina. Electronically Signed   By: Narda Rutherford M.D.   On: 12/02/2018 21:46   Dg Chest Port 1 View  Result Date: 12/02/2018 CLINICAL DATA:  Ventilator dependent respiratory failure. Current history of COVID-19 pneumonia. EXAM: PORTABLE CHEST 1 VIEW COMPARISON:  Portable chest x-ray earlier same day at 4:04 a.m. and on 12-14-18. FINDINGS: Endotracheal tube tip projects approximately 2-3 cm above the carina. RIGHT jugular central venous catheter tip projects at or near the cavoatrial junction. Nasogastric tube courses below the diaphragm into the stomach. Since the examination earlier same day, no significant change in the airspace opacities throughout the RIGHT lung, at the LEFT lung base, and in the LEFT mid lung. No new pulmonary parenchymal abnormalities. IMPRESSION: 1. Support apparatus satisfactory. 2. Stable pneumonia throughout the RIGHT lung, the LEFT lung base and the LEFT mid lung. 3. No new abnormalities. Electronically Signed   By: Hulan Saas M.D.   On: 12/02/2018 14:15   Dg Chest Port 1 View  Result Date: 12/02/2018 CLINICAL DATA:  Follow-up COVID-19 positive patient. EXAM: PORTABLE CHEST 1 VIEW COMPARISON:  14-Dec-2018 FINDINGS: Increasing bilateral pulmonary infiltrates are identified, right greater than left, consistent with the patient's history. No pneumothorax. No change in the  cardiomediastinal silhouette. IMPRESSION: Worsening bilateral infiltrates, right greater than left, consistent with the patient's history. Electronically Signed   By: Gerome Sam III M.D   On:  12/02/2018 08:07   Dg Chest Port 1 View  Result Date: 12-27-2018 CLINICAL DATA:  Dyspnea, COVID-19 positive EXAM: PORTABLE CHEST 1 VIEW COMPARISON:  None. FINDINGS: Mild enlargement of the cardiopericardial silhouette. Aortic arch atherosclerosis. Otherwise normal mediastinal contour. No pneumothorax. No pleural effusion. Patchy hazy opacity throughout right greater than left lung. IMPRESSION: Patchy hazy opacity throughout the right greater than left lung, compatible with COVID-19 pneumonia. Mild enlargement of the cardiopericardial silhouette. Electronically Signed   By: Delbert Phenix M.D.   On: Dec 27, 2018 17:31   Dg Abd Portable 1v  Result Date: 12/27/2018 CLINICAL DATA:  Abdominal pain and distension. EXAM: PORTABLE ABDOMEN - 1 VIEW COMPARISON:  None. FINDINGS: Evidence of free air. No small bowel dilatation to suggest obstruction. Mild gaseous distention of colon. Stool distends the rectum. Multiple overlying monitoring devices. No radiopaque calculi. IMPRESSION: Nonobstructive bowel gas pattern with mild gaseous colonic distention, suspect a degree of colonic ileus. Mild rectal distention with stool. Electronically Signed   By: Narda Rutherford M.D.   On: 27-Dec-2018 23:44   Ct Renal Stone Study  Result Date: 12/01/2018 CLINICAL DATA:  AKI, rule out renal obstruction EXAM: CT ABDOMEN AND PELVIS WITHOUT CONTRAST TECHNIQUE: Multidetector CT imaging of the abdomen and pelvis was performed following the standard protocol without IV contrast. COMPARISON:  None. FINDINGS: Examination of the abdomen is somewhat limited by breath motion artifact throughout. Lower chest: Bibasilar ground-glass and heterogeneous airspace opacities and small bilateral pleural effusions. Coronary artery calcifications. Hepatobiliary: No focal liver abnormality is seen. No gallstones, gallbladder wall thickening, or biliary dilatation. Pancreas: Unremarkable. No pancreatic ductal dilatation or surrounding inflammatory changes.  Spleen: Normal in size without focal abnormality. Adrenals/Urinary Tract: Adrenal glands are unremarkable. No evidence of renal calculus or hydronephrosis. There is a calcification in the vicinity of the mid left ureter, which appears to be within the left ovarian vein, reflecting a phlebolith. The urinary bladder is decompressed by a Foley catheter. Stomach/Bowel: Stomach is within normal limits. Appendix appears normal. No evidence of bowel wall thickening, distention, or inflammatory changes. Vascular/Lymphatic: Calcific atherosclerosis. No enlarged abdominal or pelvic lymph nodes. Reproductive: Uterine fibroids. Other: No abdominal wall hernia or abnormality. No abdominopelvic ascites. Musculoskeletal: No acute or significant osseous findings. IMPRESSION: 1. Examination of the abdomen is somewhat limited by breath motion artifact throughout. 2. No evidence of renal calculus or hydronephrosis. There is a calcification in the vicinity of the mid left ureter, which appears to be within the left ovarian vein, reflecting a phlebolith. The urinary bladder is decompressed by a Foley catheter. 3. Bibasilar ground-glass and heterogeneous airspace opacities and small bilateral pleural effusions, generally consistent with infection or aspiration. 4.  Other chronic and incidental findings as detailed above. Electronically Signed   By: Lauralyn Primes M.D.   On: 12/01/2018 21:18    Microbiology Recent Results (from the past 240 hour(s))  MRSA PCR Screening     Status: Abnormal   Collection Time: 12/02/18 11:21 PM  Result Value Ref Range Status   MRSA by PCR POSITIVE (A) NEGATIVE Final    Comment:        The GeneXpert MRSA Assay (FDA approved for NASAL specimens only), is one component of a comprehensive MRSA colonization surveillance program. It is not intended to diagnose MRSA infection nor to guide or monitor treatment for MRSA infections. RESULT  CALLED TO, READ BACK BY AND VERIFIED WITH: Royann ShiversJEN MOTLEY RN @  [phone] ON 12/03/18 BY ROBINSON Z.  Performed at [facility], [address] [place], [place] [address]   Culture, blood (routine x 2)     Status: None   Collection Time: 12/03/18  2:30 AM  Result Value Ref Range Status   Specimen Description BLOOD LEFT ANTECUBITAL  Final   Special Requests   Final    BOTTLES DRAWN AEROBIC ONLY Blood Culture adequate volume   Culture   Final    NO GROWTH 5 DAYS Performed at [facility], [address] [place], [place] [address]    Report Status 12/08/2018 FINAL  Final  Culture, blood (routine x 2)     Status: None   Collection Time: 12/03/18  2:45 AM  Result Value Ref Range Status   Specimen Description BLOOD LEFT HAND  Final   Special Requests   Final    BOTTLES DRAWN AEROBIC ONLY Blood Culture adequate volume   Culture   Final    NO GROWTH 5 DAYS Performed at [facility], [address] [place], [place] [address]    Report Status 12/08/2018 FINAL  Final  Culture, respiratory (non-expectorated)     Status: None   Collection Time: 12/07/18  3:05 PM  Result Value Ref Range Status   Specimen Description   Final    TRACHEAL ASPIRATE Performed at [facility], [address] [place], [place] [address]    Special Requests   Final    NONE Performed at [facility], [address] [place], [place] [address]    Gram Stain   Final    ABUNDANT WBC PRESENT,BOTH PMN AND MONONUCLEAR RARE GRAM POSITIVE COCCI RARE GRAM VARIABLE ROD Performed at [facility], [address] [place], [place] [address]    Culture FEW METHICILLIN RESISTANT STAPHYLOCOCCUS AUREUS  Final   Report Status 11/29/2018 FINAL  Final   Organism ID, Bacteria METHICILLIN RESISTANT STAPHYLOCOCCUS AUREUS  Final      Susceptibility   Methicillin resistant staphylococcus aureus - MIC*    CIPROFLOXACIN >=8 RESISTANT Resistant     ERYTHROMYCIN >=8 RESISTANT Resistant     GENTAMICIN <=0.5 SENSITIVE Sensitive     OXACILLIN  >=4 RESISTANT Resistant     TETRACYCLINE <=1 SENSITIVE Sensitive     VANCOMYCIN 1 SENSITIVE Sensitive     TRIMETH/SULFA >=320 RESISTANT Resistant     CLINDAMYCIN <=0.25 SENSITIVE Sensitive     RIFAMPIN <=0.5 SENSITIVE Sensitive     Inducible Clindamycin NEGATIVE Sensitive     * FEW METHICILLIN RESISTANT STAPHYLOCOCCUS AUREUS    Lab Basic Metabolic Panel: Recent Labs  Lab 12/07/18 1530 12/08/18 0340 12/08/18 0500 12/08/18 1532 12/09/18 0200 12/17/2018 0332  NA 136 138 136 134* 137 136  K 4.6 4.8 4.8 4.7 4.4 5.0  CL 103  --  104 102 103 104  CO2 25  --  20* 22 26 21*  GLUCOSE 212*  --  233* 223* 217* 317*  BUN 26*  --  27* 29* 28* 53*  CREATININE 1.51*  --  1.71* 1.59* 1.56* 3.02*  CALCIUM 7.3*  --  7.3* 7.5* 7.5* 7.6*  PHOS 2.4*  --  2.8 2.3* 2.1* 3.7   Liver Function Tests: Recent Labs  Lab 12/05/18 0140  12/07/18 1530 12/08/18 0500 12/08/18 1532 12/09/18 0200 12/16/2018 0332  AST 49*  --   --   --   --   --   --  ALT 21  --   --   --   --   --   --   ALKPHOS 196*  --   --   --   --   --   --   BILITOT 0.6  --   --   --   --   --   --   PROT 5.9*  --   --   --   --   --   --   ALBUMIN 2.1*  2.2*   < > 2.3* 2.4* 2.4* 2.4* 2.3*   < > = values in this interval not displayed.   No results for input(s): LIPASE, AMYLASE in the last 168 hours. No results for input(s): AMMONIA in the last 168 hours. CBC: Recent Labs  Lab 12/05/18 0140  12/06/18 0220  12/07/18 0240 12/07/18 0515 12/07/18 0706 12/07/18 1156 12/08/18 0340 12/08/18 0440  WBC 10.3  --  12.9*  --  14.5*  --   --   --   --  24.8*  NEUTROABS 8.1*  --   --   --  12.5*  --   --   --   --  20.4*  HGB 11.4*   < > 10.8*   < > 11.0* 10.5* 10.5* 11.2* 11.6* 11.1*  HCT 35.0*   < > 34.1*   < > 34.8* 31.0* 31.0* 33.0* 34.0* 36.5  MCV 87.1  --  88.8  --  88.8  --   --   --   --  91.0  PLT 131*  --  102*  --  100*  --   --   --   --  126*   < > = values in this interval not displayed.   Cardiac Enzymes: Recent  Labs  Lab 12/05/18 0140  TROPONINI 0.21*   Sepsis Labs: Recent Labs  Lab 12/05/18 0140 12/06/18 0220 12/07/18 0240 12/08/18 0440  PROCALCITON 13.40 11.32 5.80 3.52  WBC 10.3 12.9* 14.5* 24.8*    Procedures/Operations   Acute blood loss anemia, 2u PRBC 5/16 Moved to [facility] 5/17 CVVHD 5/17 ETT 5/16  L IJ HD cath 5/17   [facility] Camren Henthorn 12/11/2018, 4:17 PM

## 2018-12-18 NOTE — Progress Notes (Signed)
NAME:  Julia Thomas, MRN:  [id number], DOB:  February 12, 1941, LOS: 10 ADMISSION DATE:  04-Dec-2018, CONSULTATION DATE:  5/16  REFERRING MD:  Ronaldo Miyamoto, CHIEF COMPLAINT:  Dyspnea   Brief History   78 y/o female with multiple medical problems admitted from a SNF with COVID 19 and dyspnea.    Past Medical History  Dementia CHF HTN  Significant Hospital Events   Acute blood loss anemia, 2u PRBC 5/16 VDRF 5/16  Moved to [facility] 5/17 CVVHD 5/17 Prone position  Consults:  Renal 12/01/2018 PCCM 12/02/2018  Procedures:  ETT 5/16 >>  L IJ HD cath 5/17 >>   Significant Diagnostic Tests:  Chest x-ray-multifocal infiltrate  Micro Data:  Blood cultures 5/14  Antimicrobials:  Cefepime 5/14>> 5/23 Vancomycin 5/14>> 5/23  Actemra 5/17 Steroids 5/17 >>  5/24 Paralytics 5/18  Interim history/subjective:  No significant clinical changes CVVHD held yesterday given plans for withdrawal of care   Objective   Blood pressure (!) 114/54, pulse 72, temperature 98.7 F (37.1 C), temperature source Axillary, resp. rate (!) 35, height 5\' 4"  (1.626 m), weight 101.4 kg, SpO2 100 %.    Vent Mode: PRVC FiO2 (%):  [40 %-60 %] 40 % Set Rate:  [35 bmp] 35 bmp Vt Set:  [380 mL] 380 mL PEEP:  [16 cmH20] 16 cmH20 Plateau Pressure:  [30 cmH20-34 cmH20] 30 cmH20   Intake/Output Summary (Last 24 hours) at 12/09/2018 1321 Last data filed at 11/26/2018 1300 Gross per 24 hour  Intake 322.59 ml  Output 0 ml  Net 322.59 ml   Filed Weights   12/06/18 0447 12/07/18 0545 12/08/18 0449  Weight: 113.9 kg 113.8 kg 101.4 kg    Examination: Exam is unchanged:   General: Ill-appearing woman, ventilated, heavily sedated HENT: ET tube in place, OG tube in place PULM: Coarse bilateral breath sounds CV: Regular, borderline tachycardic, no murmur GI: Soft, obese, nondistended, positive hypoactive bowel sounds MSK: No deformities Neuro: Does not wake to voice, stimulation.  She does have an inherent drive to  breathe   Resolved Hospital Problem list   Dark stools, possible mild GI bleed  Assessment & Plan:  COVID 19 causing ARDS: severe, prognosis poor, oxygenation worse again on 5/21 At this point I do not see any way that this is survivable.  Continues to have high vent needs, CVVH dependent and now anuric with CVVH discontinued, remains in shock. Have discussed goals of care with family as below, point person his daughter [name].  They are preparing for withdrawal of care Stop steroids  Shock, hypovolemic and septic Remains on norepinephrine, low-dose  Acute renal failure CVVH discontinued 5/23.  No plans to reinitiate given our GOC discussions  Dementia, need for sedation   Continue adequate sedation, comfort measures  Nutrition Tube feeds running at this time, discontinue when we withdraw formally    Best practice:  Diet: Tube feeding Pain/Anxiety/Delirium protocol (if indicated): RASS score -3 to -4 VAP protocol (if indicated): Yes DVT prophylaxis: Sub cutaneous heparin GI prophylaxis: Pantoprazole Glucose control: Sliding scale insulin, monitor glucose Mobility: Bedrest Code Status: limited code blue, see PCCM note from 5/19  Family Communication:  5/22 >> I spoke with her daughter [name], also called her daughter Ander Slade but was unable to reach her. I explained that pt is progressively worse despite maximal support, that prognosis for survival has become progressively poor. I have recommended that we move to a transition to comfort care. [name] understands and agrees. She would like to arrange to visit, also  notes that Ander SladeJoy may want to come down from [place] to see the pt. They also have a brother who may visit. I've recommended that we attempt to organize in the next 1-2 days.   5/23 >> I spoke with Thelma BargeFrancis again today.  She is contemplating the timing on a withdrawal of care, notes that her brother is still having some difficulty excepting that the patient is not going to  improve.  All the same it sounds like they are planning for withdrawal either today or tomorrow.  I will continue to communicate with her  5/24 >> called Scarlette CalicoFrances. She is speaking with family to see if anyone else would like to do virtual visit. Plan will be to withdraw MV and transition to full comfort this afternoon. She will call the ICU to let the RN know when they are ready to proceed.    Labs   CBC: Recent Labs  Lab 12/04/18 0245  12/05/18 0140  12/06/18 0220  12/07/18 0240 12/07/18 0515 12/07/18 0706 12/07/18 1156 12/08/18 0340 12/08/18 0440  WBC 6.0  --  10.3  --  12.9*  --  14.5*  --   --   --   --  24.8*  NEUTROABS  --   --  8.1*  --   --   --  12.5*  --   --   --   --  20.4*  HGB 11.2*  11.2*   < > 11.4*   < > 10.8*   < > 11.0* 10.5* 10.5* 11.2* 11.6* 11.1*  HCT 33.7*  33.8*   < > 35.0*   < > 34.1*   < > 34.8* 31.0* 31.0* 33.0* 34.0* 36.5  MCV 86.2  --  87.1  --  88.8  --  88.8  --   --   --   --  91.0  PLT 129*  --  131*  --  102*  --  100*  --   --   --   --  126*   < > = values in this interval not displayed.    Basic Metabolic Panel: Recent Labs  Lab 12/04/18 0245  12/07/18 1530 12/08/18 0340 12/08/18 0500 12/08/18 1532 12/09/18 0200 12/08/2018 0332  NA 140   < > 136 138 136 134* 137 136  K 4.1   < > 4.6 4.8 4.8 4.7 4.4 5.0  CL 109   < > 103  --  104 102 103 104  CO2 20*   < > 25  --  20* 22 26 21*  GLUCOSE 264*   < > 212*  --  233* 223* 217* 317*  BUN 60*   < > 26*  --  27* 29* 28* 53*  CREATININE 3.98*   < > 1.51*  --  1.71* 1.59* 1.56* 3.02*  CALCIUM 7.3*   < > 7.3*  --  7.3* 7.5* 7.5* 7.6*  MG 2.3  --   --   --   --   --   --   --   PHOS 3.4   < > 2.4*  --  2.8 2.3* 2.1* 3.7   < > = values in this interval not displayed.   GFR: Estimated Creatinine Clearance: 18.4 mL/min (A) (by C-G formula based on SCr of 3.02 mg/dL (H)). Recent Labs  Lab 12/05/18 0140 12/06/18 0220 12/07/18 0240 12/08/18 0440  PROCALCITON 13.40 11.32 5.80 3.52  WBC 10.3  12.9* 14.5* 24.8*    Liver Function Tests: Recent Labs  Lab 12/05/18 0140  12/07/18 1530 12/08/18 0500 12/08/18 1532 12/09/18 0200 11/24/2018 0332  AST 49*  --   --   --   --   --   --   ALT 21  --   --   --   --   --   --   ALKPHOS 196*  --   --   --   --   --   --   BILITOT 0.6  --   --   --   --   --   --   PROT 5.9*  --   --   --   --   --   --   ALBUMIN 2.1*  2.2*   < > 2.3* 2.4* 2.4* 2.4* 2.3*   < > = values in this interval not displayed.   No results for input(s): LIPASE, AMYLASE in the last 168 hours. No results for input(s): AMMONIA in the last 168 hours.  ABG    Component Value Date/Time   PHART 7.211 (L) 12/08/2018 0340   PCO2ART 58.0 (H) 12/08/2018 0340   PO2ART 40.0 (LL) 12/08/2018 0340   HCO3 23.3 12/08/2018 0340   TCO2 25 12/08/2018 0340   ACIDBASEDEF 5.0 (H) 12/08/2018 0340   O2SAT 63.0 12/08/2018 0340     Coagulation Profile: No results for input(s): INR, PROTIME in the last 168 hours.  Cardiac Enzymes: Recent Labs  Lab 12/05/18 0140  TROPONINI 0.21*    HbA1C: Hgb A1c MFr Bld  Date/Time Value Ref Range Status  12/07/2018 02:40 AM 6.6 (H) 4.8 - 5.6 % Final    Comment:    (NOTE) Pre diabetes:          5.7%-6.4% Diabetes:              >6.4% Glycemic control for   <7.0% adults with diabetes     CBG: Recent Labs  Lab 12/09/18 2034 12/09/18 2332 12/08/2018 0328 11/22/2018 0757 12/04/2018 1129  GLUCAP 282* 251* 286* 275* 211*     Critical care time: 34 minutes     Levy Pupa, MD, PhD 12/09/2018, 1:21 PM [facility] [phone] or if no answer [phone]

## 2018-12-18 NOTE — Progress Notes (Signed)
Assisted tele visit to patient with family members.  Kaytlen Lightsey Ann, RN  

## 2018-12-18 DEATH — deceased

## 2020-08-06 IMAGING — DX PORTABLE ABDOMEN - 1 VIEW
1 series · 1 of 1 positions shown · non-contrast
Comparison: None.

CLINICAL DATA: Abdominal pain and distension.

EXAM:
PORTABLE ABDOMEN - 1 VIEW

[abdomen kub]
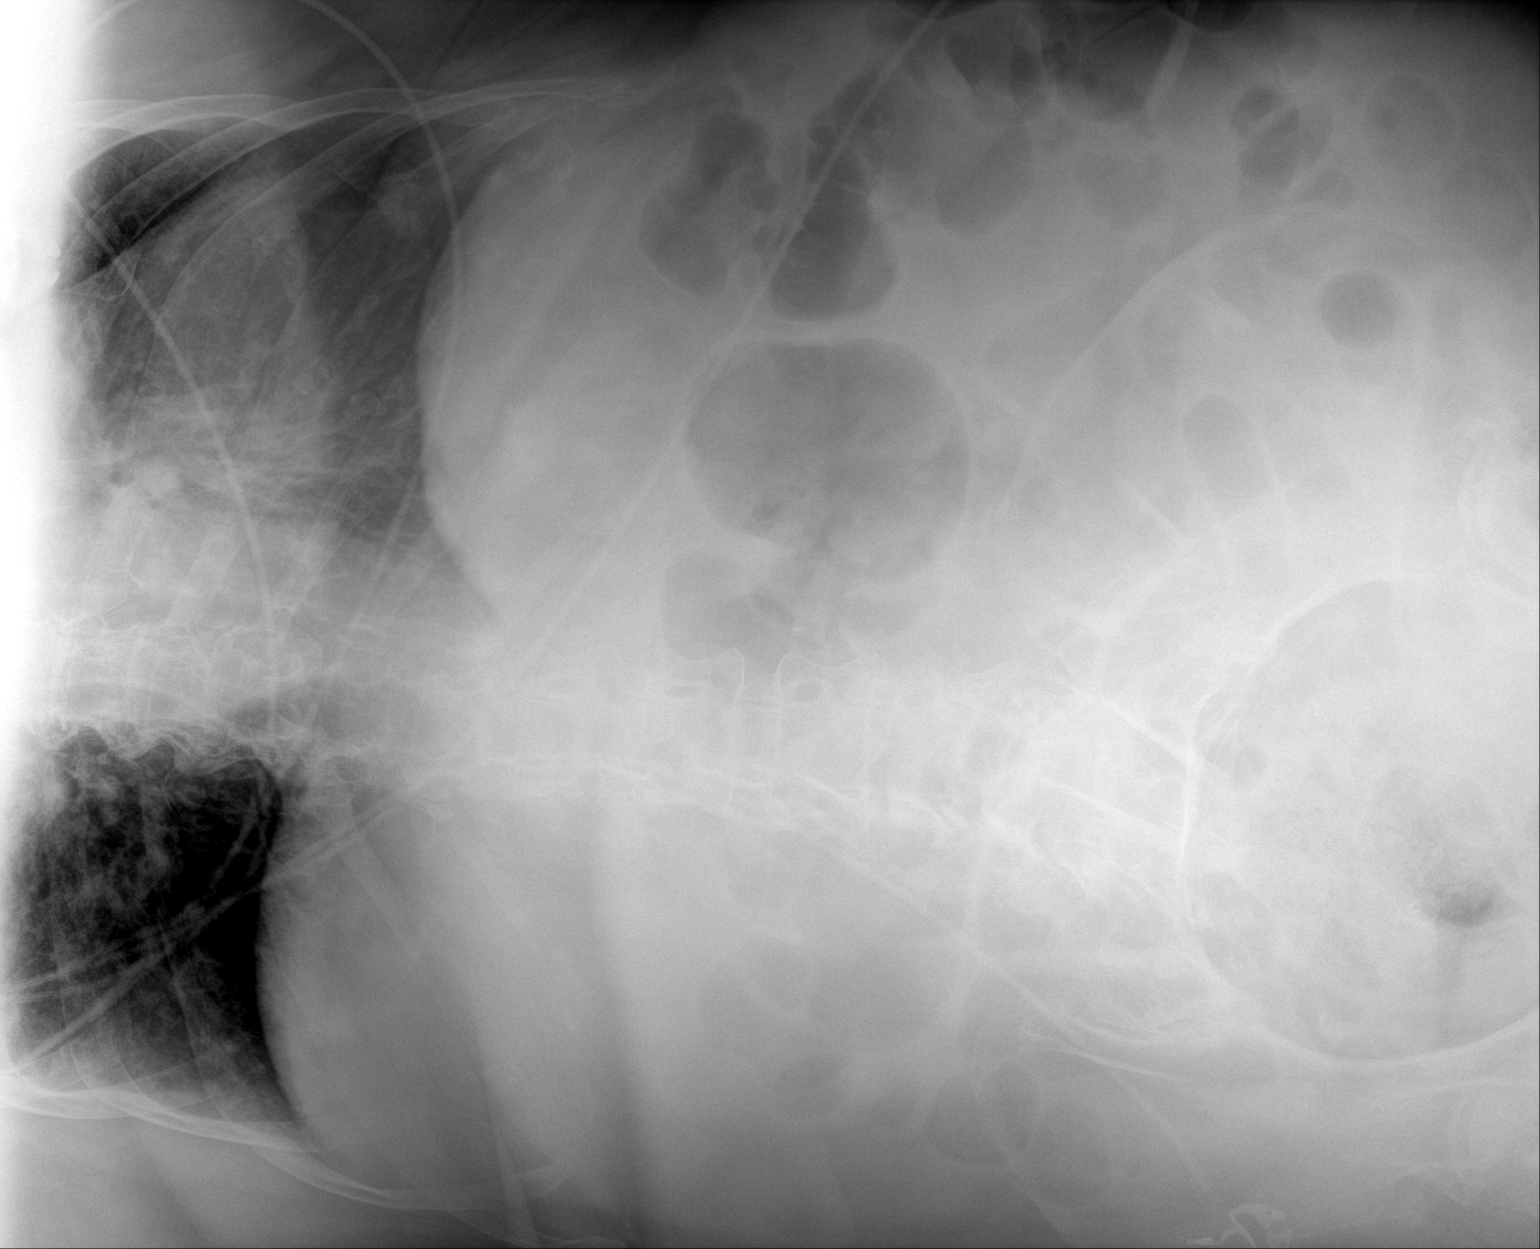

[1 of 1 positions shown; findings below may reference images not displayed]

FINDINGS: Evidence of free air. No small bowel dilatation to suggest
obstruction. Mild gaseous distention of colon. Stool distends the
rectum. Multiple overlying monitoring devices. No radiopaque
calculi.
IMPRESSION: Nonobstructive bowel gas pattern with mild gaseous colonic
distention, suspect a degree of colonic ileus. Mild rectal
distention with stool.

## 2020-08-06 IMAGING — DX PORTABLE CHEST - 1 VIEW
1 series · 1 of 1 positions shown · non-contrast
Comparison: None.

CLINICAL DATA: Dyspnea, 7GEY0-3U positive

EXAM:
PORTABLE CHEST 1 VIEW

[chest ap]
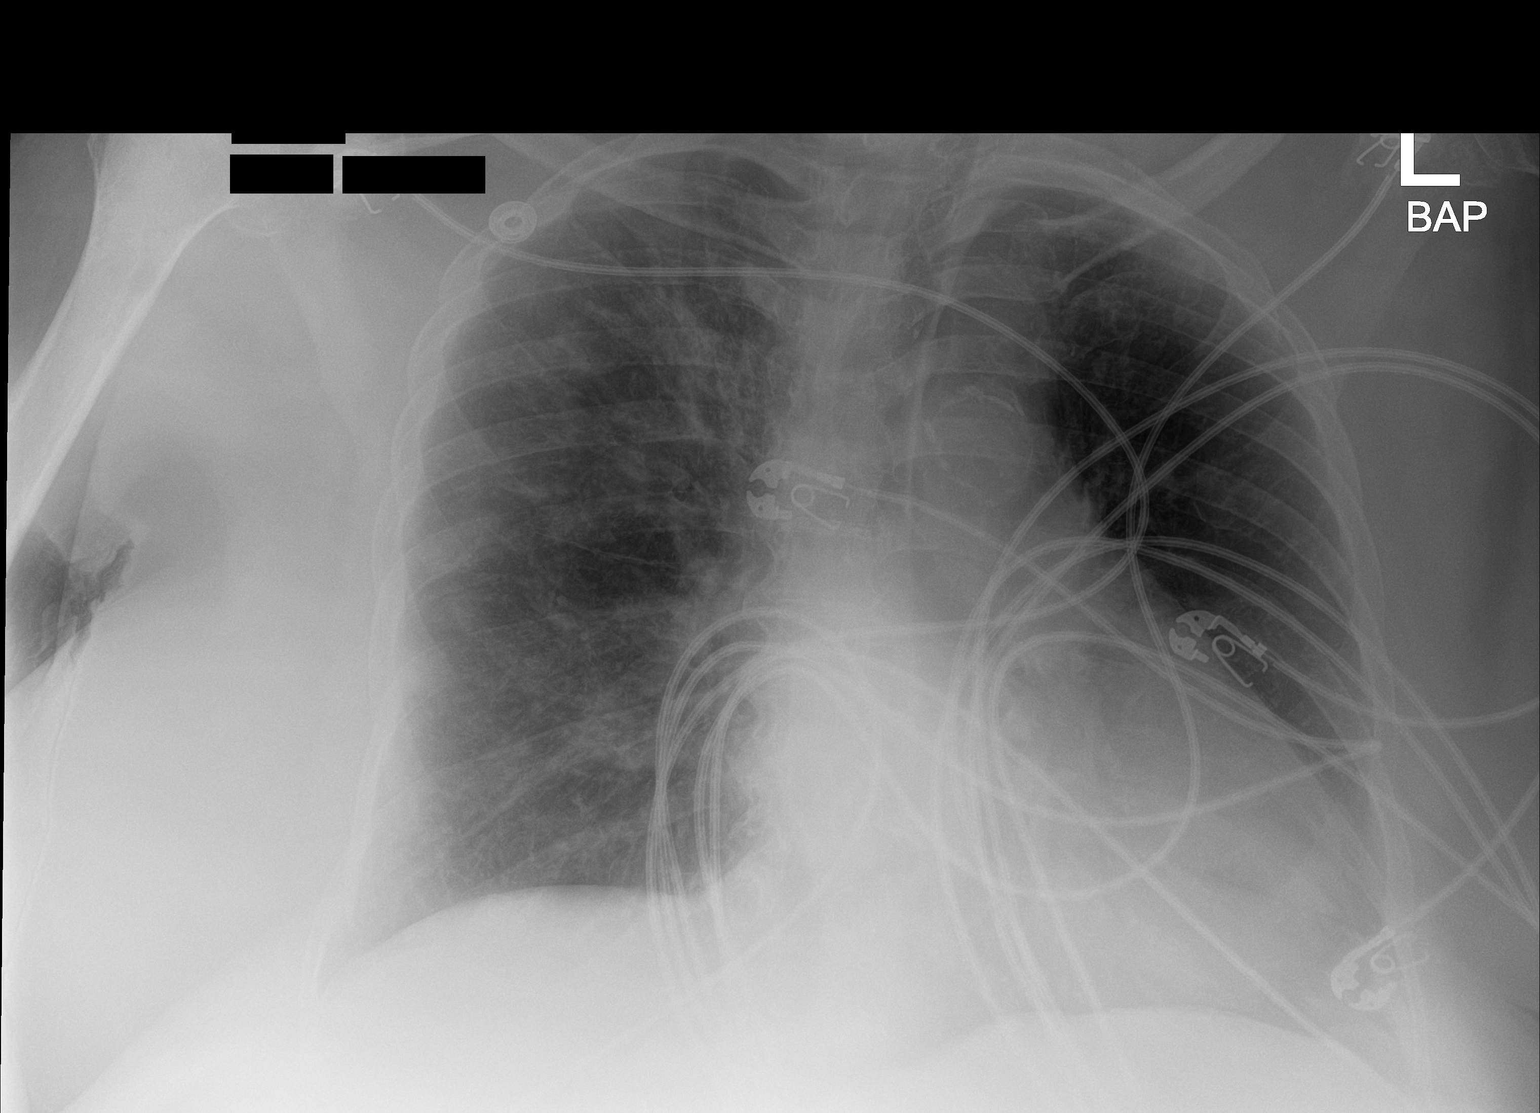

[1 of 1 positions shown; findings below may reference images not displayed]

FINDINGS: Mild enlargement of the cardiopericardial silhouette. Aortic arch
atherosclerosis. Otherwise normal mediastinal contour. No
pneumothorax. No pleural effusion. Patchy hazy opacity throughout
right greater than left lung.
IMPRESSION: Patchy hazy opacity throughout the right greater than left lung,
compatible with 7GEY0-3U pneumonia. Mild enlargement of the
cardiopericardial silhouette.

## 2020-08-07 IMAGING — CT CT RENAL STONE PROTOCOL
2 of 4 series · 16 of 46 positions shown, 18 images · non-contrast
Comparison: None.

CLINICAL DATA: SELAKOVIC, rule out renal obstruction

EXAM:
CT ABDOMEN AND PELVIS WITHOUT CONTRAST
TECHNIQUE: Multidetector CT imaging of the abdomen and pelvis was performed
following the standard protocol without IV contrast.

[Series 3: stone study 5.0 i30f 2 · axial · 0.98mm/px · z∈[+695,+1175]mm · 13 of 106 slices shown, 15 images]
[im 5/106  soft-tissue]
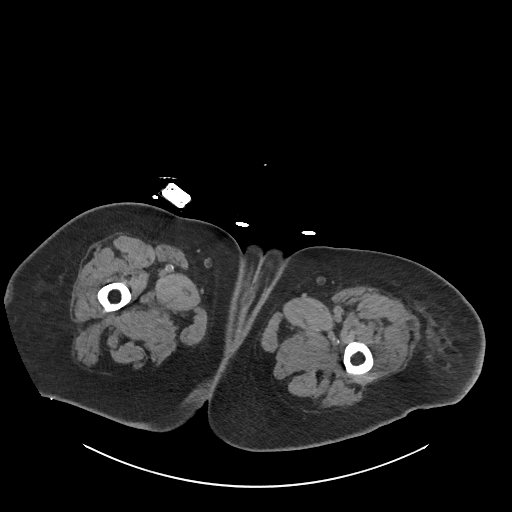
[im 5/106  bone]
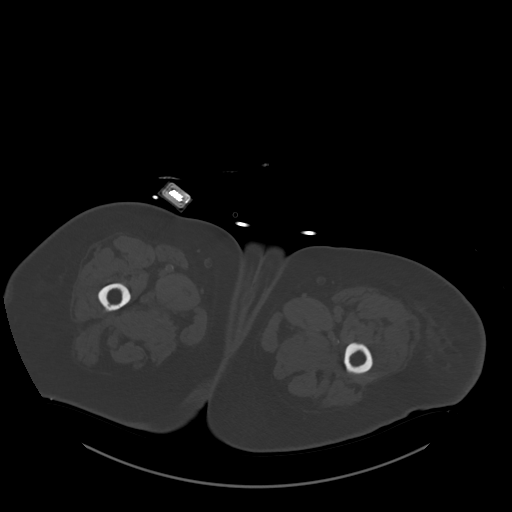
[im 13/106  soft-tissue]
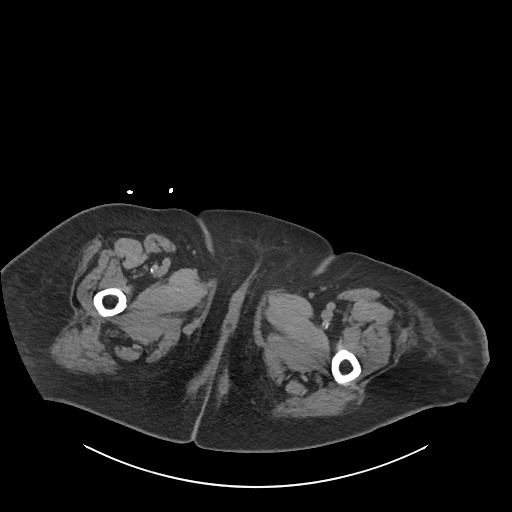
[im 21/106  soft-tissue]
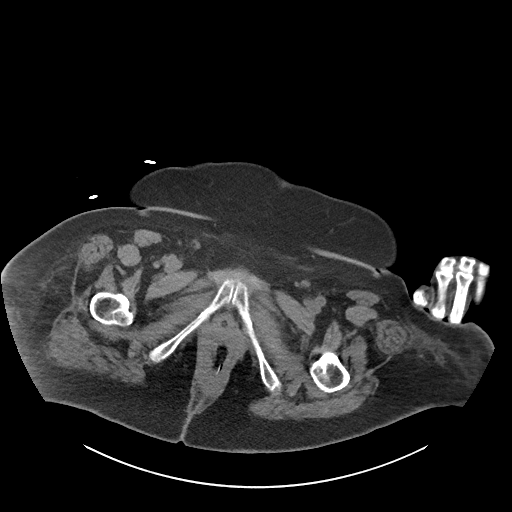
[im 29/106  soft-tissue]
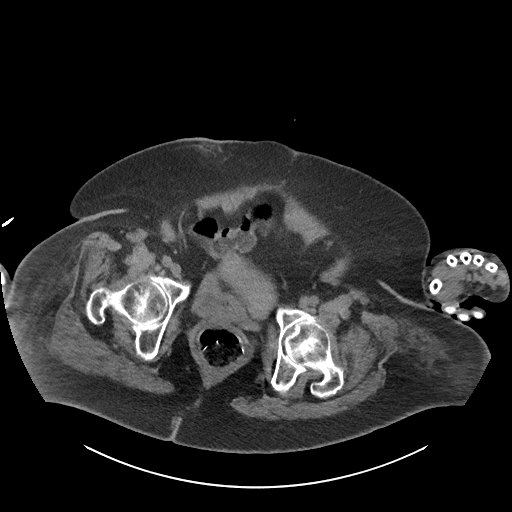
[im 37/106  soft-tissue]
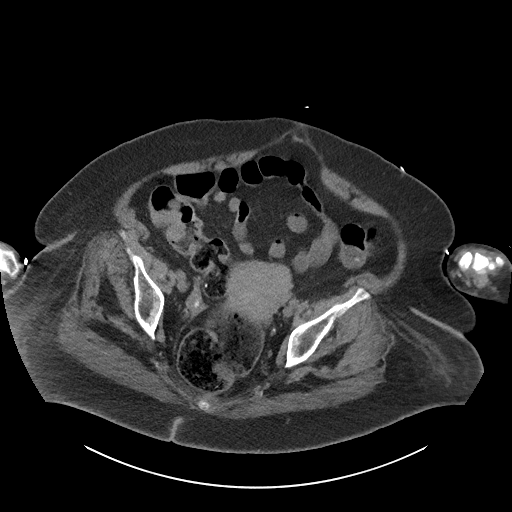
[im 45/106  soft-tissue]
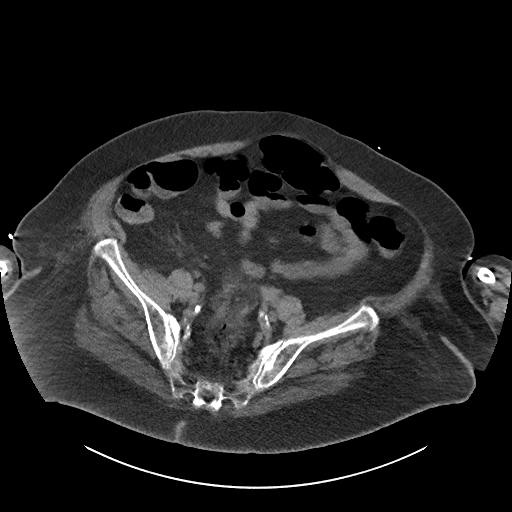
[im 53/106  soft-tissue]
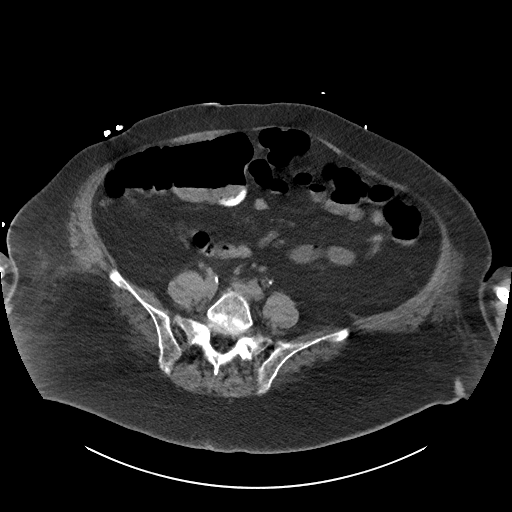
[im 61/106  soft-tissue]
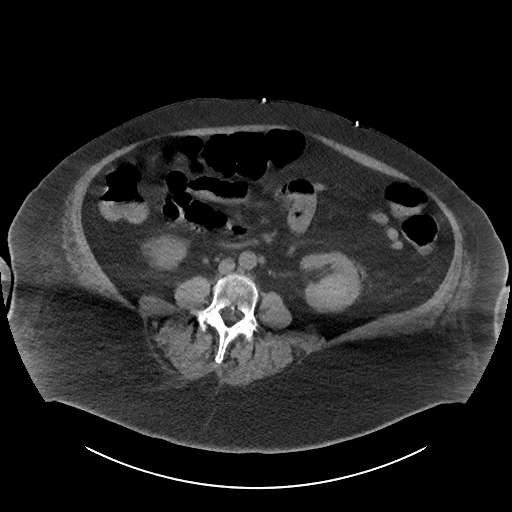
[im 69/106  soft-tissue]
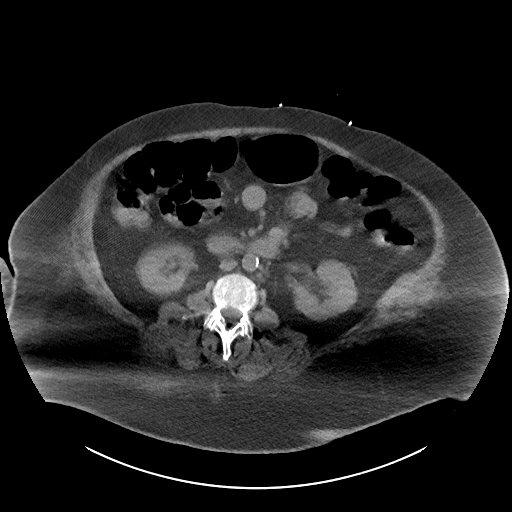
[im 69/106  bone]
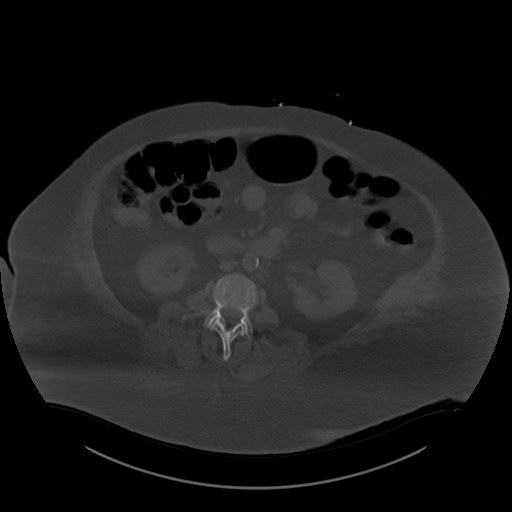
[im 77/106  soft-tissue]
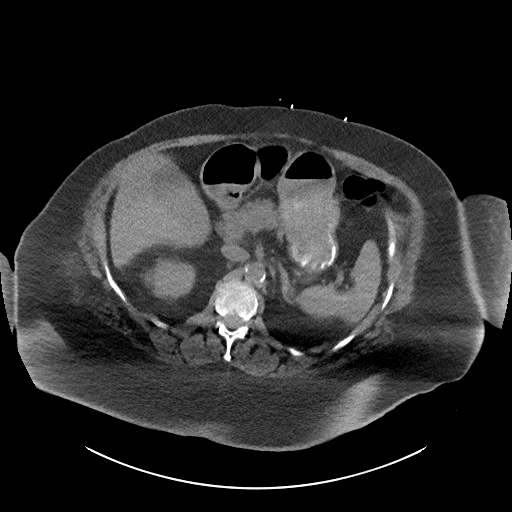
[im 85/106  soft-tissue]
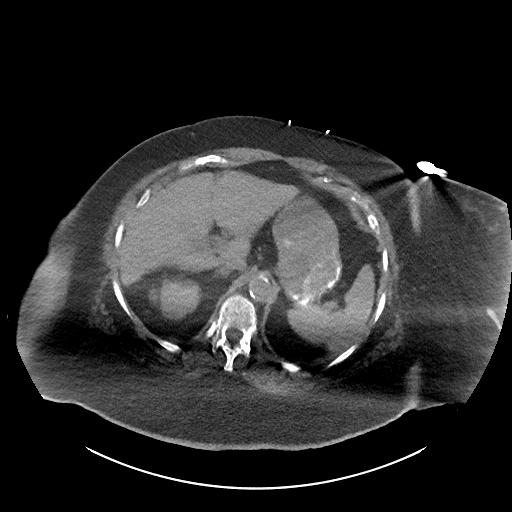
[im 93/106  soft-tissue]
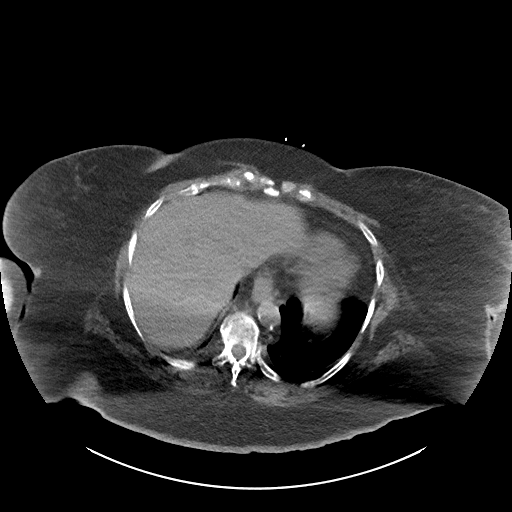
[im 101/106  soft-tissue]
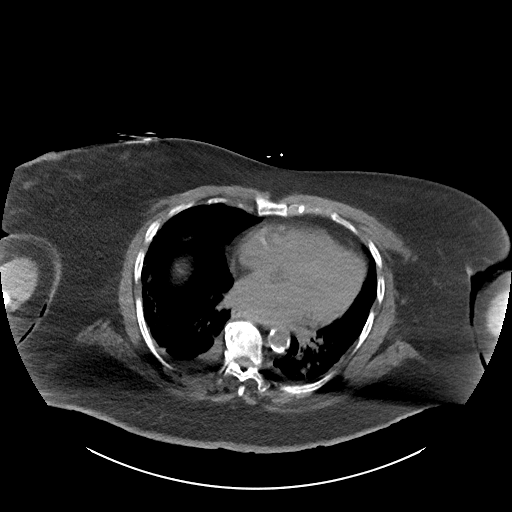

[Series 6: coronal soft tissue · coronal · 1.03mm/px · 3 of 141 slices shown]
[im 47/141  soft-tissue]
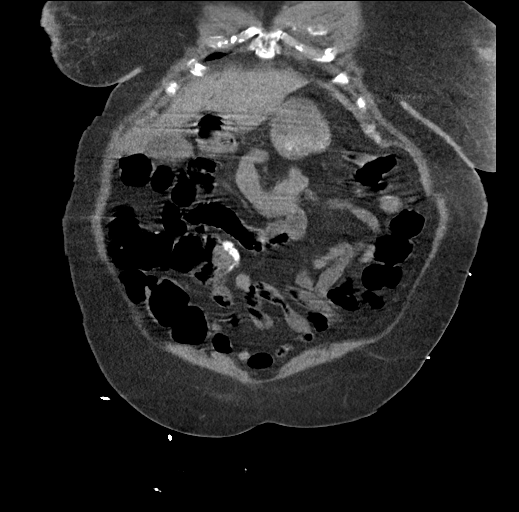
[im 63/141  soft-tissue]
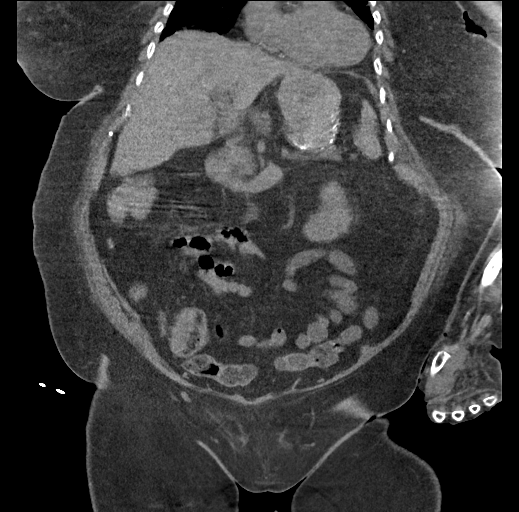
[im 78/141  soft-tissue]
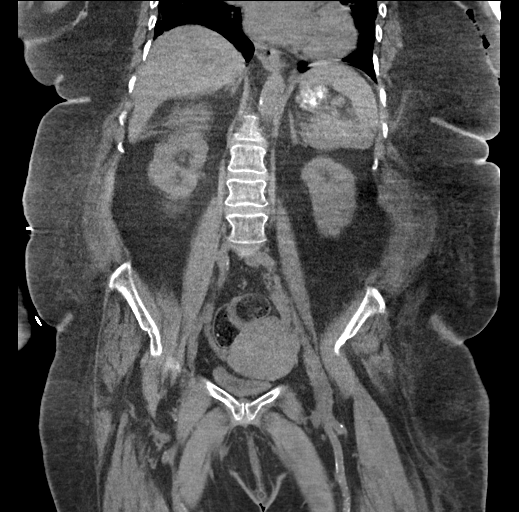

[16 of 46 positions shown; findings below may reference images not displayed]

FINDINGS: Examination of the abdomen is somewhat limited by breath motion
artifact throughout.

Lower chest: Bibasilar ground-glass and heterogeneous airspace
opacities and small bilateral pleural effusions. Coronary artery
calcifications.

Hepatobiliary: No focal liver abnormality is seen. No gallstones,
gallbladder wall thickening, or biliary dilatation.

Pancreas: Unremarkable. No pancreatic ductal dilatation or
surrounding inflammatory changes.

Spleen: Normal in size without focal abnormality.

Adrenals/Urinary Tract: Adrenal glands are unremarkable. No evidence
of renal calculus or hydronephrosis. There is a calcification in the
vicinity of the mid left ureter, which appears to be within the left
ovarian vein, reflecting a phlebolith. The urinary bladder is
decompressed by a Foley catheter.

Stomach/Bowel: Stomach is within normal limits. Appendix appears
normal. No evidence of bowel wall thickening, distention, or
inflammatory changes.

Vascular/Lymphatic: Calcific atherosclerosis. No enlarged abdominal
or pelvic lymph nodes.

Reproductive: Uterine fibroids.

Other: No abdominal wall hernia or abnormality. No abdominopelvic
ascites.

Musculoskeletal: No acute or significant osseous findings.
IMPRESSION: 1. Examination of the abdomen is somewhat limited by breath motion
artifact throughout.

2. No evidence of renal calculus or hydronephrosis. There is a
calcification in the vicinity of the mid left ureter, which appears
to be within the left ovarian vein, reflecting a phlebolith. The
urinary bladder is decompressed by a Foley catheter.

3. Bibasilar ground-glass and heterogeneous airspace opacities and
small bilateral pleural effusions, generally consistent with
infection or aspiration.

4.  Other chronic and incidental findings as detailed above.

## 2023-01-17 DEATH — deceased
